# Patient Record
Sex: Female | Born: 1945 | Race: Black or African American | Hispanic: No | Smoking: Never smoker
Health system: Southern US, Community
[De-identification: ages and names within clinical notes are randomized; demographics above are authoritative.]

## PROBLEM LIST (undated history)

## (undated) DIAGNOSIS — K219 Gastro-esophageal reflux disease without esophagitis: Secondary | ICD-10-CM

## (undated) DIAGNOSIS — F424 Excoriation (skin-picking) disorder: Secondary | ICD-10-CM

## (undated) DIAGNOSIS — M1990 Unspecified osteoarthritis, unspecified site: Secondary | ICD-10-CM

## (undated) DIAGNOSIS — G8929 Other chronic pain: Secondary | ICD-10-CM

## (undated) DIAGNOSIS — R9389 Abnormal findings on diagnostic imaging of other specified body structures: Secondary | ICD-10-CM

## (undated) DIAGNOSIS — M170 Bilateral primary osteoarthritis of knee: Secondary | ICD-10-CM

## (undated) DIAGNOSIS — N951 Menopausal and female climacteric states: Secondary | ICD-10-CM

## (undated) DIAGNOSIS — I10 Essential (primary) hypertension: Secondary | ICD-10-CM

## (undated) DIAGNOSIS — R011 Cardiac murmur, unspecified: Secondary | ICD-10-CM

## (undated) DIAGNOSIS — R911 Solitary pulmonary nodule: Secondary | ICD-10-CM

## (undated) DIAGNOSIS — I5189 Other ill-defined heart diseases: Secondary | ICD-10-CM

## (undated) DIAGNOSIS — Z79890 Hormone replacement therapy (postmenopausal): Secondary | ICD-10-CM

## (undated) HISTORY — PX: ABDOMINAL HYSTERECTOMY: SHX81

## (undated) HISTORY — DX: Gastro-esophageal reflux disease without esophagitis: K21.9

## (undated) HISTORY — DX: Essential (primary) hypertension: I10

## (undated) HISTORY — DX: Unspecified osteoarthritis, unspecified site: M19.90

## (undated) HISTORY — DX: Other ill-defined heart diseases: I51.89

## (undated) HISTORY — DX: Solitary pulmonary nodule: R91.1

## (undated) HISTORY — DX: Cardiac murmur, unspecified: R01.1

---
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
## 2014-12-20 DIAGNOSIS — H9209 Otalgia, unspecified ear: Secondary | ICD-10-CM | POA: Diagnosis not present

## 2014-12-20 DIAGNOSIS — M2662 Arthralgia of temporomandibular joint: Secondary | ICD-10-CM | POA: Diagnosis not present

## 2015-01-31 ENCOUNTER — Ambulatory Visit: Payer: Self-pay | Admitting: Family Medicine

## 2015-01-31 DIAGNOSIS — Z1231 Encounter for screening mammogram for malignant neoplasm of breast: Secondary | ICD-10-CM | POA: Diagnosis not present

## 2015-03-14 DIAGNOSIS — Z1211 Encounter for screening for malignant neoplasm of colon: Secondary | ICD-10-CM | POA: Diagnosis not present

## 2015-03-14 DIAGNOSIS — Z1212 Encounter for screening for malignant neoplasm of rectum: Secondary | ICD-10-CM | POA: Diagnosis not present

## 2015-03-21 LAB — HM COLONOSCOPY: HM Colonoscopy: NEGATIVE

## 2015-04-30 DIAGNOSIS — G8929 Other chronic pain: Secondary | ICD-10-CM | POA: Diagnosis not present

## 2015-04-30 DIAGNOSIS — R1013 Epigastric pain: Secondary | ICD-10-CM | POA: Diagnosis not present

## 2015-04-30 DIAGNOSIS — R6884 Jaw pain: Secondary | ICD-10-CM | POA: Diagnosis not present

## 2015-04-30 DIAGNOSIS — H9202 Otalgia, left ear: Secondary | ICD-10-CM | POA: Diagnosis not present

## 2015-05-24 ENCOUNTER — Ambulatory Visit (INDEPENDENT_AMBULATORY_CARE_PROVIDER_SITE_OTHER): Payer: Commercial Managed Care - HMO | Admitting: Family Medicine

## 2015-05-24 ENCOUNTER — Encounter: Payer: Self-pay | Admitting: Family Medicine

## 2015-05-24 VITALS — BP 118/78 | HR 79 | Temp 97.7°F | Resp 16 | Ht 69.0 in | Wt 204.6 lb

## 2015-05-24 DIAGNOSIS — Z9181 History of falling: Secondary | ICD-10-CM | POA: Insufficient documentation

## 2015-05-24 DIAGNOSIS — Z1389 Encounter for screening for other disorder: Secondary | ICD-10-CM | POA: Insufficient documentation

## 2015-05-24 DIAGNOSIS — Z0000 Encounter for general adult medical examination without abnormal findings: Secondary | ICD-10-CM

## 2015-05-24 DIAGNOSIS — G8929 Other chronic pain: Secondary | ICD-10-CM

## 2015-05-24 DIAGNOSIS — R1013 Epigastric pain: Secondary | ICD-10-CM | POA: Diagnosis not present

## 2015-05-24 DIAGNOSIS — R6884 Jaw pain: Secondary | ICD-10-CM | POA: Insufficient documentation

## 2015-05-24 DIAGNOSIS — H9209 Otalgia, unspecified ear: Secondary | ICD-10-CM | POA: Insufficient documentation

## 2015-05-24 DIAGNOSIS — K5909 Other constipation: Secondary | ICD-10-CM

## 2015-05-24 DIAGNOSIS — IMO0002 Reserved for concepts with insufficient information to code with codable children: Secondary | ICD-10-CM | POA: Insufficient documentation

## 2015-05-24 DIAGNOSIS — F424 Excoriation (skin-picking) disorder: Secondary | ICD-10-CM

## 2015-05-24 DIAGNOSIS — Z13220 Encounter for screening for lipoid disorders: Secondary | ICD-10-CM | POA: Insufficient documentation

## 2015-05-24 DIAGNOSIS — R011 Cardiac murmur, unspecified: Secondary | ICD-10-CM | POA: Insufficient documentation

## 2015-05-24 DIAGNOSIS — Z23 Encounter for immunization: Secondary | ICD-10-CM | POA: Diagnosis not present

## 2015-05-24 DIAGNOSIS — Z131 Encounter for screening for diabetes mellitus: Secondary | ICD-10-CM | POA: Insufficient documentation

## 2015-05-24 DIAGNOSIS — K219 Gastro-esophageal reflux disease without esophagitis: Secondary | ICD-10-CM | POA: Insufficient documentation

## 2015-05-24 DIAGNOSIS — S8391XA Sprain of unspecified site of right knee, initial encounter: Secondary | ICD-10-CM

## 2015-05-24 DIAGNOSIS — M159 Polyosteoarthritis, unspecified: Secondary | ICD-10-CM | POA: Insufficient documentation

## 2015-05-24 DIAGNOSIS — I10 Essential (primary) hypertension: Secondary | ICD-10-CM | POA: Insufficient documentation

## 2015-05-24 DIAGNOSIS — M549 Dorsalgia, unspecified: Secondary | ICD-10-CM | POA: Insufficient documentation

## 2015-05-24 DIAGNOSIS — N951 Menopausal and female climacteric states: Secondary | ICD-10-CM | POA: Insufficient documentation

## 2015-05-24 DIAGNOSIS — R300 Dysuria: Secondary | ICD-10-CM | POA: Insufficient documentation

## 2015-05-24 DIAGNOSIS — M150 Primary generalized (osteo)arthritis: Secondary | ICD-10-CM

## 2015-05-24 DIAGNOSIS — R319 Hematuria, unspecified: Secondary | ICD-10-CM | POA: Insufficient documentation

## 2015-05-24 DIAGNOSIS — K580 Irritable bowel syndrome with diarrhea: Secondary | ICD-10-CM | POA: Insufficient documentation

## 2015-05-24 DIAGNOSIS — B49 Unspecified mycosis: Secondary | ICD-10-CM | POA: Insufficient documentation

## 2015-05-24 DIAGNOSIS — Z87898 Personal history of other specified conditions: Secondary | ICD-10-CM | POA: Insufficient documentation

## 2015-05-24 HISTORY — DX: Other chronic pain: G89.29

## 2015-05-24 HISTORY — DX: Menopausal and female climacteric states: N95.1

## 2015-05-24 HISTORY — DX: Excoriation (skin-picking) disorder: F42.4

## 2015-05-24 MED ORDER — DICLOFENAC SODIUM 75 MG PO TBEC: 75.0000 mg | DELAYED_RELEASE_TABLET | Freq: Two times a day (BID) | ORAL | Status: DC

## 2015-05-24 MED ORDER — LEVOFLOXACIN 500 MG PO TABS: 500.0000 mg | ORAL_TABLET | Freq: Every day | ORAL | Status: DC

## 2015-05-24 NOTE — Patient Instructions (Signed)

## 2015-05-24 NOTE — Progress Notes (Signed)
Name: Ruth Bishop   MRN: [id number]    DOB: 1946-04-12   Date:05/24/2015       Progress Note  Subjective  Chief Complaint  Chief Complaint  Patient presents with  . Annual Exam  . Spasms    patient stated that she excerbated a previous injury  . Medication Refill    Diclofenac Sodium 75mg     HPI  Patient is here today for a Complete Female Physical Exam:  The patient has complains of acute knee pain. Overall feels healthy. Diet is well balanced. In general does exercise regularly. Sees dentist regularly and addresses vision concerns with ophthalmologist if applicable. In regards to sexual activity the patient is not currently currently sexually active. Currently is not concerned about exposure to any STDs.   Menstrual history is Menopaused.    Joint/Muscle Pain: Patient complains of arthralgias for which has been present for several years. Pain is located in multiple joints, is described as boring, sharp and throbbing, and is constant .  Associated symptoms include: decreased range of motion.  The patient has tried heat, NSAIDs, position change and rest for pain, with moderate relief.  Related to injury:   Yes. Right knee injury when walking down stairs twisting motion agrevated baseline issues. Needs form filled out to excuse her from hotel feels she had planned on prior to the injury.   Past Medical History  Diagnosis Date  . Arthritis   . GERD (gastroesophageal reflux disease)   . Heart murmur   . Hypertension     Past Surgical History  Procedure Laterality Date  . Abdominal hysterectomy      Family History  Problem Relation Age of Onset  . Heart disease Mother   . Diabetes Mother   . Heart disease Father   . Heart disease Sister   . Diabetes Sister   . Heart disease Brother   . Diabetes Brother     History   Social History  . Marital Status: Widowed    Spouse Name: N/A  . Number of Children: N/A  . Years of Education: N/A   Occupational History  . Not  on file.   Social History Main Topics  . Smoking status: Never Smoker   . Smokeless tobacco: Not on file  . Alcohol Use: No  . Drug Use: No  . Sexual Activity: Yes   Other Topics Concern  . Not on file   Social History Narrative  . No narrative on file     Current outpatient prescriptions:  .  Calcium Carbonate-Vitamin D (CALTRATE 600+D) 600-400 MG-UNIT per tablet, Take by mouth., Disp: , Rfl:  .  ciprofloxacin-dexamethasone (CIPRODEX) otic suspension, Place in ear(s)., Disp: , Rfl:  .  cyclobenzaprine (FLEXERIL) 5 MG tablet, Take by mouth., Disp: , Rfl:  .  diclofenac (VOLTAREN) 75 MG EC tablet, , Disp: , Rfl:  .  diclofenac sodium (VOLTAREN) 1 % GEL, Place onto the skin., Disp: , Rfl:  .  estrogen-methylTESTOSTERone 0.625-1.25 MG per tablet, Take by mouth., Disp: , Rfl:  .  Hyoscyamine Sulfate 0.375 MG TBCR, Take by mouth., Disp: , Rfl:  .  losartan-hydrochlorothiazide (HYZAAR) 100-12.5 MG per tablet, Take by mouth., Disp: , Rfl:  .  MULTIPLE VITAMIN PO, Take by mouth., Disp: , Rfl:  .  NEOMYCIN-POLYMYXIN-HYDROCORTISONE (CORTISPORIN) 1 % SOLN otic solution, Place in ear(s)., Disp: , Rfl:  .  omeprazole (PRILOSEC) 20 MG capsule, , Disp: , Rfl:  .  omeprazole (PRILOSEC) 40 MG capsule, Take by mouth., Disp: ,  Rfl:  .  rifaximin (XIFAXAN) 200 MG tablet, Take by mouth., Disp: , Rfl:  .  senna-docusate (STOOL SOFTENER PLUS LAXATIVE) 8.6-50 MG per tablet, Take by mouth., Disp: , Rfl:  .  traMADol (ULTRAM) 50 MG tablet, Take by mouth., Disp: , Rfl:   Allergies  Allergen Reactions  . Nitrofurantoin Monohyd Macro     ROS  CONSTITUTIONAL: No significant weight changes, fever, chills, weakness or fatigue.  HEENT:  - Eyes: No visual changes.  - Ears: No auditory changes. No pain.  - Nose: No sneezing, congestion, runny nose. - Throat: No sore throat. No changes in swallowing. SKIN: No rash or itching.  CARDIOVASCULAR: No chest pain, chest pressure or chest discomfort. No  palpitations or edema.  RESPIRATORY: No shortness of breath, cough or sputum.  GASTROINTESTINAL: No anorexia, nausea, vomiting. No changes in bowel habits. No abdominal pain or blood.  GENITOURINARY: No dysuria. No frequency. No discharge.  NEUROLOGICAL: No headache, dizziness, syncope, paralysis, ataxia, numbness or tingling in the extremities. No memory changes. No change in bowel or bladder control.  MUSCULOSKELETAL: Acute injury to chronic knee and back issues. Right knee pain. Left lower back pain. No muscle pain. HEMATOLOGIC: No anemia, bleeding or bruising.  LYMPHATICS: No enlarged lymph nodes.  PSYCHIATRIC: No change in mood. No change in sleep pattern.  ENDOCRINOLOGIC: No reports of sweating, cold or heat intolerance. No polyuria or polydipsia.   Objective  Filed Vitals:   05/24/15 1119  BP: 118/78  Pulse: 79  Temp: 97.7 F (36.5 C)  TempSrc: Oral  Resp: 16  Height: 5\' 9"  (1.753 m)  Weight: 204 lb 9.6 oz (92.806 kg)  SpO2: 96%    No results found for this or any previous visit (from the past 2160 hour(s)).  Physical Exam  Musculoskeletal:       Right knee: She exhibits bony tenderness and MCL laxity. She exhibits no swelling, no effusion, no ecchymosis, no deformity, no laceration, no erythema, normal alignment and normal patellar mobility. Tenderness found.       Legs:   Constitutional: Patient appears well-developed and well-nourished. In no distress.  HEENT:  - Head: Normocephalic and atraumatic.  - Ears: Bilateral TMs gray, no erythema or effusion - Nose: Nasal mucosa moist - Mouth/Throat: Oropharynx is clear and moist. No tonsillar hypertrophy or erythema. No post nasal drainage.  - Eyes: Conjunctivae clear, EOM movements normal. PERRLA. No scleral icterus.  Neck: Normal range of motion. Neck supple. No JVD present. No thyromegaly present.  Cardiovascular: Normal rate, regular rhythm and normal heart sounds.  Stable 2/6 SEM. Pulmonary/Chest: Effort normal and  breath sounds normal. No respiratory distress. Abdominal: Soft. Bowel sounds are normal, no distension. There is no tenderness. no masses BREAST: Bilateral breast exam normal with no masses, skin changes or nipple discharge FEMALE GENITALIA: deferred RECTAL: deferred Musculoskeletal: Normal range of motion bilateral UE.  Physical Exam  Musculoskeletal:       Right knee: She exhibits bony tenderness and MCL laxity. She exhibits no swelling, no effusion, no ecchymosis, no deformity, no laceration, no erythema, normal alignment and normal patellar mobility. Tenderness found.       Legs:  Peripheral vascular: Bilateral LE no edema. Neurological: CN II-XII grossly intact with no focal deficits. Alert and oriented to person, place, and time. Coordination, balance, strength, speech and gait are normal.  Skin: Skin is warm and dry. 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 Psychiatric: Patient has a normal mood and affect. Behavior is normal  in office today. Judgment and thought content normal in office today.   Assessment & Plan  1. Annual physical exam   2. Knee sprain and strain, right, initial encounter We discussed potential pathology and long term risk of reoccurrence. Maintaining an ideal body habitus, regular exercise, proper lifting techniques and mindfulness of exacerbating factors will be useful in long term management.  Instructed on use of heating pad with exercises. Consider concomitant therapy with PT, massage therapist or chiropractor. May use anti-inflammatory medication and muscle relaxer as needed.  - diclofenac (VOLTAREN) 75 MG EC tablet; Take 1 tablet (75 mg total) by mouth 2 (two) times daily.  Dispense: 60 tablet; Refill: 5  3. Primary osteoarthritis involving multiple joints Return to [facility] if needed.  4. Abdominal pain, epigastric Previously levaquin has helped.   - levofloxacin (LEVAQUIN) 500 MG tablet; Take 1 tablet (500  mg total) by mouth daily.  Dispense: 10 tablet; Refill: 0  5. Need for pneumococcal vaccination  - Pneumococcal polysaccharide vaccine 23-valent greater than or equal to 2yo subcutaneous/IM  6. Need for diphtheria-tetanus-pertussis (Tdap) vaccine, adult/adolescent  - Tdap vaccine greater than or equal to 7yo IM

## 2015-06-05 ENCOUNTER — Telehealth: Payer: Self-pay | Admitting: Family Medicine

## 2015-06-05 ENCOUNTER — Other Ambulatory Visit: Payer: Self-pay | Admitting: Family Medicine

## 2015-06-05 DIAGNOSIS — I10 Essential (primary) hypertension: Secondary | ICD-10-CM

## 2015-06-05 DIAGNOSIS — Z13220 Encounter for screening for lipoid disorders: Secondary | ICD-10-CM

## 2015-06-05 NOTE — Telephone Encounter (Signed)
Pt recently came in for CPE and didn't get her labs done. She was wondering if you could print the slip so that she can get her labs done. Pt was like to be notified when slip is ready.

## 2015-06-05 NOTE — Telephone Encounter (Signed)
Lab work ordered and ready to be picked up and done.

## 2015-06-05 NOTE — Telephone Encounter (Signed)
Patient was informed via phone.

## 2015-06-07 ENCOUNTER — Other Ambulatory Visit: Payer: Self-pay | Admitting: Family Medicine

## 2015-06-07 DIAGNOSIS — I10 Essential (primary) hypertension: Secondary | ICD-10-CM | POA: Diagnosis not present

## 2015-06-08 LAB — COMPREHENSIVE METABOLIC PANEL
ALBUMIN: 3.9 g/dL (ref 3.6–4.8)
ALK PHOS: 136 IU/L — AB (ref 39–117)
ALT: 27 IU/L (ref 0–32)
AST: 19 IU/L (ref 0–40)
Albumin/Globulin Ratio: 1.4 (ref 1.1–2.5)
BILIRUBIN TOTAL: 0.6 mg/dL (ref 0.0–1.2)
BUN / CREAT RATIO: 17 (ref 11–26)
BUN: 14 mg/dL (ref 8–27)
CALCIUM: 9.1 mg/dL (ref 8.7–10.3)
CO2: 25 mmol/L (ref 18–29)
Chloride: 102 mmol/L (ref 97–108)
Creatinine, Ser: 0.84 mg/dL (ref 0.57–1.00)
GFR calc non Af Amer: 73 mL/min/{1.73_m2} (ref >59)
GFR, EST AFRICAN AMERICAN: 84 mL/min/{1.73_m2} (ref >59)
Globulin, Total: 2.7 g/dL (ref 1.5–4.5)
Glucose: 101 mg/dL — ABNORMAL HIGH (ref 65–99)
Potassium: 4.3 mmol/L (ref 3.5–5.2)
Sodium: 142 mmol/L (ref 134–144)
TOTAL PROTEIN: 6.6 g/dL (ref 6.0–8.5)

## 2015-06-08 LAB — CBC WITH DIFFERENTIAL/PLATELET
BASOS ABS: 0 10*3/uL (ref 0.0–0.2)
BASOS: 0 %
EOS (ABSOLUTE): 0.3 10*3/uL (ref 0.0–0.4)
EOS: 4 %
Hematocrit: 41.4 % (ref 34.0–46.6)
Hemoglobin: 13.6 g/dL (ref 11.1–15.9)
IMMATURE GRANULOCYTES: 0 %
Immature Grans (Abs): 0 10*3/uL (ref 0.0–0.1)
LYMPHS ABS: 3.1 10*3/uL (ref 0.7–3.1)
Lymphs: 45 %
MCH: 26.8 pg (ref 26.6–33.0)
MCHC: 32.9 g/dL (ref 31.5–35.7)
MCV: 82 fL (ref 79–97)
Monocytes Absolute: 0.3 10*3/uL (ref 0.1–0.9)
Monocytes: 4 %
Neutrophils Absolute: 3.3 10*3/uL (ref 1.4–7.0)
Neutrophils: 47 %
Platelets: 267 10*3/uL (ref 150–379)
RBC: 5.07 x10E6/uL (ref 3.77–5.28)
RDW: 15.5 % — ABNORMAL HIGH (ref 12.3–15.4)
WBC: 7 10*3/uL (ref 3.4–10.8)

## 2015-06-08 LAB — LIPID PANEL
Chol/HDL Ratio: 2.9 ratio units (ref 0.0–4.4)
Cholesterol, Total: 148 mg/dL (ref 100–199)
HDL: 51 mg/dL (ref >39)
LDL Calculated: 75 mg/dL (ref 0–99)
Triglycerides: 110 mg/dL (ref 0–149)
VLDL Cholesterol Cal: 22 mg/dL (ref 5–40)

## 2015-06-08 LAB — TSH: TSH: 0.906 u[IU]/mL (ref 0.450–4.500)

## 2015-06-11 ENCOUNTER — Telehealth: Payer: Self-pay | Admitting: Family Medicine

## 2015-06-11 DIAGNOSIS — M170 Bilateral primary osteoarthritis of knee: Secondary | ICD-10-CM

## 2015-06-11 DIAGNOSIS — R748 Abnormal levels of other serum enzymes: Secondary | ICD-10-CM

## 2015-06-11 NOTE — Telephone Encounter (Signed)
Patient states she needs a letter stating she got her CPE. She is also asking for a referral to [facility] for her knee. Patient states she is also going to be taking her grandchildren home on a train and she would like a note stating she has a disability due to her knees so she can get help while on the train.

## 2015-06-13 ENCOUNTER — Telehealth: Payer: Self-pay

## 2015-06-13 DIAGNOSIS — R748 Abnormal levels of other serum enzymes: Secondary | ICD-10-CM | POA: Insufficient documentation

## 2015-06-13 DIAGNOSIS — M170 Bilateral primary osteoarthritis of knee: Secondary | ICD-10-CM | POA: Insufficient documentation

## 2015-06-13 HISTORY — DX: Bilateral primary osteoarthritis of knee: M17.0

## 2015-06-13 NOTE — Telephone Encounter (Signed)
Patient was informed and stated she will be by later today or tomorrow to pick up lab order and letter

## 2015-06-13 NOTE — Telephone Encounter (Signed)
Message left for patient to come by and pick up lab order and letter.

## 2015-06-13 NOTE — Telephone Encounter (Signed)
Ruth Bishop please speak to Ruth Bishop regarding a few things:  1) Lab work looks good but one of her liver enzymes is slightly elevated and I would like to do further blood work to look into this further. I have already ordered the blood work through this telephone encounter, please print out lab orders if not already found on the printer and let patient know to pick up and have lab work done, doesn't have to be fasting.  2) I printed out a letter she requested stating that she had her CPE done, you may have to reprint it again if it is not on the printer and instead of my signature just place our clinic stamp Bonnita Nasuti has it).  3) I am not comfortable stating she has any disabilities with her knees to get assistance while traveling.  She can ask her ortho doctor for this who knows her knee issues best. I have also placed the referral to [facility] so she can continue seeing them.

## 2015-06-14 LAB — GAMMA GT: GGT: 67 IU/L — AB (ref 0–60)

## 2015-06-14 LAB — HIV ANTIBODY (ROUTINE TESTING W REFLEX): HIV Screen 4th Generation wRfx: NONREACTIVE

## 2015-06-14 LAB — HEPATITIS A ANTIBODY, TOTAL: HEP A TOTAL AB: NEGATIVE

## 2015-06-14 LAB — HEPATITIS B SURFACE ANTIBODY,QUALITATIVE: HEP B SURFACE AB, QUAL: NONREACTIVE

## 2015-06-14 LAB — HEPATITIS C ANTIBODY: HEP C VIRUS AB: 0.1 {s_co_ratio} (ref 0.0–0.9)

## 2015-06-17 ENCOUNTER — Telehealth: Payer: Self-pay

## 2015-06-17 NOTE — Telephone Encounter (Signed)
Contacted this patient to review the results from her recent blood work. After she verified her date of birth, normal labs were reviewed. Patient was thrilled and said thanks for the call.

## 2015-06-19 ENCOUNTER — Telehealth: Payer: Self-pay | Admitting: Family Medicine

## 2015-06-19 ENCOUNTER — Encounter: Payer: Self-pay | Admitting: Family Medicine

## 2015-06-19 NOTE — Telephone Encounter (Signed)
Requesting return call. Would like to know if you can order the upper GI test.

## 2015-06-20 ENCOUNTER — Other Ambulatory Visit: Payer: Self-pay | Admitting: Family Medicine

## 2015-06-20 DIAGNOSIS — R1013 Epigastric pain: Secondary | ICD-10-CM

## 2015-06-20 DIAGNOSIS — K219 Gastro-esophageal reflux disease without esophagitis: Secondary | ICD-10-CM

## 2015-06-20 NOTE — Telephone Encounter (Signed)
Patient stated a test for her stomach because it is still not feeling right. She stated that she still cannot keep anything down and wants to have it checked out.

## 2015-06-20 NOTE — Telephone Encounter (Signed)
Please ask Mrs Bostick to pick up lab slip I have ordered a breath test to be done at [facility] to see if she has a H. Pylori stomach infection.

## 2015-06-20 NOTE — Telephone Encounter (Signed)
Please ask patient to clarify what she means by "upper GI test", I may need to refer to a Gastroenterology specialist depending on her symptoms and the type of testing she is requesting.

## 2015-06-20 NOTE — Telephone Encounter (Signed)
Tried to contact patient to inform her about the new lab order, but there was no answer.

## 2015-06-21 ENCOUNTER — Other Ambulatory Visit: Payer: Self-pay | Admitting: Family Medicine

## 2015-06-21 DIAGNOSIS — K219 Gastro-esophageal reflux disease without esophagitis: Secondary | ICD-10-CM | POA: Diagnosis not present

## 2015-06-21 DIAGNOSIS — R1013 Epigastric pain: Secondary | ICD-10-CM | POA: Diagnosis not present

## 2015-06-21 DIAGNOSIS — M170 Bilateral primary osteoarthritis of knee: Secondary | ICD-10-CM | POA: Diagnosis not present

## 2015-06-21 NOTE — Telephone Encounter (Signed)
Tried to contact this patient again to inform her about the additional testing that has been ordered, but there was no answer. A message was left for the patient instructing her to come by and pick up her lab order and if she had any questions to give [place] a call.

## 2015-06-24 ENCOUNTER — Other Ambulatory Visit: Payer: Self-pay | Admitting: Family Medicine

## 2015-06-24 DIAGNOSIS — K219 Gastro-esophageal reflux disease without esophagitis: Secondary | ICD-10-CM

## 2015-06-24 DIAGNOSIS — K5909 Other constipation: Secondary | ICD-10-CM

## 2015-06-24 DIAGNOSIS — R1013 Epigastric pain: Secondary | ICD-10-CM

## 2015-06-24 DIAGNOSIS — K580 Irritable bowel syndrome with diarrhea: Secondary | ICD-10-CM

## 2015-06-24 LAB — H. PYLORI BREATH TEST: H. PYLORI UBIT: NEGATIVE

## 2015-06-24 NOTE — Progress Notes (Signed)
Referral to [facility] placed per patient request.

## 2015-07-01 ENCOUNTER — Telehealth: Payer: Self-pay | Admitting: Family Medicine

## 2015-07-01 NOTE — Telephone Encounter (Signed)
Pt states she is still waiting on her referral to the GI dr. She was told that if she did not hear anything last week to call today.

## 2015-07-01 NOTE — Telephone Encounter (Signed)
Called patient, had to leave a message. I faxed her referral to [facility], I'm waiting for them to fax back with an appt, date and time.

## 2015-07-02 NOTE — Telephone Encounter (Signed)
Spoke with patient and notified her that once we here from [facility] with an appt date and time I'll notify her

## 2015-07-19 ENCOUNTER — Encounter: Payer: Self-pay | Admitting: Family Medicine

## 2015-08-09 ENCOUNTER — Telehealth: Payer: Self-pay | Admitting: Family Medicine

## 2015-08-09 NOTE — Telephone Encounter (Signed)
ERRENOUS °

## 2015-08-26 ENCOUNTER — Telehealth: Payer: Self-pay | Admitting: Family Medicine

## 2015-08-26 ENCOUNTER — Other Ambulatory Visit: Payer: Self-pay

## 2015-08-26 NOTE — Telephone Encounter (Addendum)
Got a fax from [facility] requesting a refill of estrogenmtest & losartan/hct (100-12.5)  Refill request was sent to Dr. Bobetta Lime for approval and submission.

## 2015-08-26 NOTE — Telephone Encounter (Signed)
REFILL NEEDED 1. LOSARTAIN /HCT AND 2. EST ESTROGEN MTEST . PT IS OUT. [place]

## 2015-08-26 NOTE — Telephone Encounter (Signed)
Refill request was sent to Dr. Ashany Sundaram for approval and submission.  

## 2015-08-27 ENCOUNTER — Telehealth: Payer: Self-pay | Admitting: Family Medicine

## 2015-08-27 DIAGNOSIS — R1084 Generalized abdominal pain: Secondary | ICD-10-CM | POA: Diagnosis not present

## 2015-08-27 DIAGNOSIS — K219 Gastro-esophageal reflux disease without esophagitis: Secondary | ICD-10-CM | POA: Diagnosis not present

## 2015-08-27 DIAGNOSIS — R634 Abnormal weight loss: Secondary | ICD-10-CM | POA: Diagnosis not present

## 2015-08-27 MED ORDER — EST ESTROGENS-METHYLTEST 0.625-1.25 MG PO TABS: 1.0000 | ORAL_TABLET | Freq: Every day | ORAL | Status: DC

## 2015-08-27 MED ORDER — LOSARTAN POTASSIUM-HCTZ 100-12.5 MG PO TABS: 1.0000 | ORAL_TABLET | Freq: Every day | ORAL | Status: DC

## 2015-08-27 NOTE — Telephone Encounter (Signed)
Pt called her pharmacy and was told that her losartan was denied. Would like a return call to explain why. States she really need the prescription. Last seen her in June 2016. (h) [phone] (c) [phone]

## 2015-08-27 NOTE — Telephone Encounter (Signed)
Tried to contac tpatient to let her know the estrogen-methyltestosterone rx is ready for pick up and that the rx for Losartan was sent in, but there was no answer and i was not able to leave a message.

## 2015-09-03 DIAGNOSIS — I10 Essential (primary) hypertension: Secondary | ICD-10-CM | POA: Diagnosis not present

## 2015-09-03 DIAGNOSIS — H5210 Myopia, unspecified eye: Secondary | ICD-10-CM | POA: Diagnosis not present

## 2015-09-30 ENCOUNTER — Encounter: Payer: Self-pay | Admitting: *Deleted

## 2015-10-01 ENCOUNTER — Ambulatory Visit
Admission: RE | Admit: 2015-10-01 | Discharge: 2015-10-01 | Disposition: A | Discharge status: Home / Self Care | Payer: Commercial Managed Care - HMO | Source: Ambulatory Visit | Attending: Gastroenterology | Admitting: Gastroenterology

## 2015-10-01 ENCOUNTER — Ambulatory Visit: Payer: Commercial Managed Care - HMO | Admitting: Anesthesiology

## 2015-10-01 ENCOUNTER — Encounter
Admission: RE | Disposition: A | Discharge status: Home / Self Care | Payer: Self-pay | Source: Ambulatory Visit | Attending: Gastroenterology

## 2015-10-01 ENCOUNTER — Encounter: Payer: Self-pay | Admitting: Anesthesiology

## 2015-10-01 DIAGNOSIS — R1084 Generalized abdominal pain: Secondary | ICD-10-CM | POA: Diagnosis present

## 2015-10-01 DIAGNOSIS — K635 Polyp of colon: Secondary | ICD-10-CM | POA: Diagnosis not present

## 2015-10-01 DIAGNOSIS — K2950 Unspecified chronic gastritis without bleeding: Secondary | ICD-10-CM | POA: Diagnosis not present

## 2015-10-01 DIAGNOSIS — I10 Essential (primary) hypertension: Secondary | ICD-10-CM | POA: Diagnosis not present

## 2015-10-01 DIAGNOSIS — K5730 Diverticulosis of large intestine without perforation or abscess without bleeding: Secondary | ICD-10-CM | POA: Insufficient documentation

## 2015-10-01 DIAGNOSIS — Z888 Allergy status to other drugs, medicaments and biological substances status: Secondary | ICD-10-CM | POA: Diagnosis not present

## 2015-10-01 DIAGNOSIS — K5900 Constipation, unspecified: Secondary | ICD-10-CM | POA: Insufficient documentation

## 2015-10-01 DIAGNOSIS — K621 Rectal polyp: Secondary | ICD-10-CM | POA: Diagnosis not present

## 2015-10-01 DIAGNOSIS — K317 Polyp of stomach and duodenum: Secondary | ICD-10-CM | POA: Insufficient documentation

## 2015-10-01 DIAGNOSIS — M1990 Unspecified osteoarthritis, unspecified site: Secondary | ICD-10-CM | POA: Insufficient documentation

## 2015-10-01 DIAGNOSIS — K210 Gastro-esophageal reflux disease with esophagitis: Secondary | ICD-10-CM | POA: Insufficient documentation

## 2015-10-01 DIAGNOSIS — K209 Esophagitis, unspecified: Secondary | ICD-10-CM | POA: Diagnosis not present

## 2015-10-01 DIAGNOSIS — D127 Benign neoplasm of rectosigmoid junction: Secondary | ICD-10-CM | POA: Diagnosis not present

## 2015-10-01 DIAGNOSIS — K2970 Gastritis, unspecified, without bleeding: Secondary | ICD-10-CM | POA: Diagnosis not present

## 2015-10-01 DIAGNOSIS — R011 Cardiac murmur, unspecified: Secondary | ICD-10-CM | POA: Insufficient documentation

## 2015-10-01 DIAGNOSIS — K2960 Other gastritis without bleeding: Secondary | ICD-10-CM | POA: Diagnosis not present

## 2015-10-01 DIAGNOSIS — K3189 Other diseases of stomach and duodenum: Secondary | ICD-10-CM | POA: Diagnosis not present

## 2015-10-01 HISTORY — PX: COLONOSCOPY WITH PROPOFOL: SHX5780

## 2015-10-01 HISTORY — PX: ESOPHAGOGASTRODUODENOSCOPY (EGD) WITH PROPOFOL: SHX5813

## 2015-10-01 SURGERY — COLONOSCOPY WITH PROPOFOL
Anesthesia: General

## 2015-10-01 MED ORDER — SODIUM CHLORIDE 0.9 % IV SOLN
INTRAVENOUS | Status: DC
  Administered 2015-10-01 (×2): via INTRAVENOUS

## 2015-10-01 MED ORDER — PROPOFOL 500 MG/50ML IV EMUL
INTRAVENOUS | Status: DC | PRN
  Administered 2015-10-01: 140 ug/kg/min via INTRAVENOUS

## 2015-10-01 MED ORDER — PROPOFOL 10 MG/ML IV BOLUS
INTRAVENOUS | Status: DC | PRN
  Administered 2015-10-01 (×2): 20 mg via INTRAVENOUS

## 2015-10-01 MED ORDER — FENTANYL CITRATE (PF) 100 MCG/2ML IJ SOLN
INTRAMUSCULAR | Status: DC | PRN
Start: 2015-10-01 — End: 2015-10-01
  Administered 2015-10-01: 50 ug via INTRAVENOUS

## 2015-10-01 MED ORDER — SODIUM CHLORIDE 0.9 % IV SOLN: INTRAVENOUS | Status: DC

## 2015-10-01 MED ORDER — LIDOCAINE HCL (CARDIAC) 20 MG/ML IV SOLN
INTRAVENOUS | Status: DC | PRN
  Administered 2015-10-01: 30 mg via INTRAVENOUS

## 2015-10-01 MED ORDER — MIDAZOLAM HCL 5 MG/5ML IJ SOLN
INTRAMUSCULAR | Status: DC | PRN
  Administered 2015-10-01: 1 mg via INTRAVENOUS

## 2015-10-01 NOTE — Anesthesia Preprocedure Evaluation (Signed)
Anesthesia Evaluation  Patient identified by MRN, date of birth, ID band Patient awake    Reviewed: Allergy & Precautions, H&P , NPO status , Patient's Chart, lab work & pertinent test results  History of Anesthesia Complications Negative for: history of anesthetic complications  Airway Mallampati: II  TM Distance: >3 FB Neck ROM: full    Dental no notable dental hx. (+) Teeth Intact   Pulmonary neg pulmonary ROS,    Pulmonary exam normal breath sounds clear to auscultation       Cardiovascular Exercise Tolerance: Good hypertension, Normal cardiovascular exam+ Valvular Problems/Murmurs  Rhythm:regular Rate:Normal     Neuro/Psych negative neurological ROS  negative psych ROS   GI/Hepatic Neg liver ROS, GERD  Controlled,  Endo/Other  negative endocrine ROS  Renal/GU negative Renal ROS  negative genitourinary   Musculoskeletal negative musculoskeletal ROS (+)   Abdominal   Peds negative pediatric ROS (+)  Hematology negative hematology ROS (+)   Anesthesia Other Findings Past Medical History:   Arthritis                                                    GERD (gastroesophageal reflux disease)                       Heart murmur                                                 Hypertension                                                Past Surgical History:   ABDOMINAL HYSTERECTOMY                                       BMI    Body Mass Index   29.07 kg/m 2      Reproductive/Obstetrics negative OB ROS                             Anesthesia Physical Anesthesia Plan  ASA: III  Anesthesia Plan: General   Post-op Pain Management:    Induction:   Airway Management Planned:   Additional Equipment:   Intra-op Plan:   Post-operative Plan:   Informed Consent: I have reviewed the patients History and Physical, chart, labs and discussed the procedure including the risks, benefits  and alternatives for the proposed anesthesia with the patient or authorized representative who has indicated his/her understanding and acceptance.   Dental Advisory Given  Plan Discussed with: Anesthesiologist, CRNA and Surgeon  Anesthesia Plan Comments:         Anesthesia Quick Evaluation

## 2015-10-01 NOTE — Anesthesia Postprocedure Evaluation (Signed)
  Anesthesia Post-op Note  Patient: Ruth Bishop  Procedure(s) Performed: Procedure(s): COLONOSCOPY WITH PROPOFOL (N/A) ESOPHAGOGASTRODUODENOSCOPY (EGD) WITH PROPOFOL (N/A)  Anesthesia type:General  Patient location: PACU  Post pain: Pain level controlled  Post assessment: Post-op Vital signs reviewed, Patient's Cardiovascular Status Stable, Respiratory Function Stable, Patent Airway and No signs of Nausea or vomiting  Post vital signs: Reviewed and stable  Last Vitals:  Filed Vitals:   10/01/15 1602  BP: 133/63  Pulse: 56  Temp:   Resp: 13    Level of consciousness: awake, alert  and patient cooperative  Complications: No apparent anesthesia complications

## 2015-10-01 NOTE — H&P (Signed)
Outpatient short stay form Pre-procedure 10/01/2015 2:35 PM Ruth Sails MD  Primary Physician: Dr. Iva Lento  Reason for visit:  EGD and colonoscopy  History of present illness:  Patient is a 69 year old female presenting today with complaint of abdominal pain. This is generalized but mostly in the periumbilical region mostly above such. There is some intermittent constipation. She takes some stool softeners. She is previously taking some diclofenac for arthritic pain however that has been discontinued and some her discomfort has improved. She is also taking Protonix 40 mg a day now previously taking omeprazole. She tolerated her prep well. She takes no aspirin or blood thinning products.    Current facility-administered medications:  .  0.9 %  sodium chloride infusion, , Intravenous, Continuous, Ruth Sails, MD, Last Rate: 20 mL/hr at 10/01/15 1328 .  0.9 %  sodium chloride infusion, , Intravenous, Continuous, Ruth Sails, MD  Prescriptions prior to admission  Medication Sig Dispense Refill Last Dose  . Calcium Carbonate-Vitamin D (CALTRATE 600+D) 600-400 MG-UNIT per tablet Take by mouth.   Taking  . ciprofloxacin-dexamethasone (CIPRODEX) otic suspension Place in ear(s).     . cyclobenzaprine (FLEXERIL) 5 MG tablet Take by mouth.   Taking  . diclofenac (VOLTAREN) 75 MG EC tablet Take 1 tablet (75 mg total) by mouth 2 (two) times daily. 60 tablet 5   . diclofenac sodium (VOLTAREN) 1 % GEL Place onto the skin.     Marland Kitchen estrogen-methylTESTOSTERone 0.625-1.25 MG per tablet Take 1 tablet by mouth daily. 30 tablet 5   . Hyoscyamine Sulfate 0.375 MG TBCR Take by mouth.   Taking  . levofloxacin (LEVAQUIN) 500 MG tablet Take 1 tablet (500 mg total) by mouth daily. 10 tablet 0   . losartan-hydrochlorothiazide (HYZAAR) 100-12.5 MG per tablet Take 1 tablet by mouth daily. 90 tablet 3   . MULTIPLE VITAMIN PO Take by mouth.   Taking  . NEOMYCIN-POLYMYXIN-HYDROCORTISONE (CORTISPORIN) 1 %  SOLN otic solution Place in ear(s).   Taking  . omeprazole (PRILOSEC) 20 MG capsule    Taking  . omeprazole (PRILOSEC) 40 MG capsule Take by mouth.   Taking  . rifaximin (XIFAXAN) 200 MG tablet Take by mouth.   Taking  . senna-docusate (STOOL SOFTENER PLUS LAXATIVE) 8.6-50 MG per tablet Take by mouth.   Taking  . traMADol (ULTRAM) 50 MG tablet Take by mouth.   Taking     Allergies  Allergen Reactions  . Nitrofurantoin Monohyd Macro      Past Medical History  Diagnosis Date  . Arthritis   . GERD (gastroesophageal reflux disease)   . Heart murmur   . Hypertension     Review of systems:      Physical Exam    Heart and lungs: Regular rate and rhythm without rub or gallop, lungs are bilaterally clear    HEENT: Norm cephalic atraumatic eyes are anicteric    Other:     Pertinant exam for procedure: Soft mild tenderness to palpation in the lower epigastric region extending toward the umbilicus. No masses or rebound. There is a small herniation/ thinness of the abdominal wall of the umbilicus does not appear to contain anything.    Planned proceedures: EGD and colonoscopy with indicated procedures. I have discussed the risks benefits and complications of procedures to include not limited to bleeding, infection, perforation and the risk of sedation and the patient wishes to proceed.    Ruth Sails, MD Gastroenterology 10/01/2015  2:35 PM

## 2015-10-01 NOTE — Op Note (Signed)
[facility] Gastroenterology Patient Name: Ruth Bishop Procedure Date: 10/01/2015 2:37 PM MRN: [id number] Account #: [id number] Date of Birth: 1946/09/29 Admit Type: Outpatient Age: 69 Room: [facility] ENDO ROOM 3 Gender: Female Note Status: Finalized Procedure:         Colonoscopy Indications:       Generalized abdominal pain Providers:         Lollie Sails, MD Referring MD:      Bobetta Lime (Referring MD) Medicines:         Monitored Anesthesia Care Complications:     No immediate complications. Procedure:         Pre-Anesthesia Assessment:                    - ASA Grade Assessment: III - A patient with severe                     systemic disease.                    After obtaining informed consent, the colonoscope was                     passed under direct vision. Throughout the procedure, the                     patient's blood pressure, pulse, and oxygen saturations                     were monitored continuously. The Colonoscope was                     introduced through the anus and advanced to the the cecum,                     identified by appendiceal orifice and ileocecal valve. The                     colonoscopy was performed without difficulty. The patient                     tolerated the procedure well. The quality of the bowel                     preparation was good. Findings:      A 4 mm polyp was found in the recto-sigmoid colon. The polyp was       sessile. The polyp was removed with a cold snare. Resection and       retrieval were complete.      Two sessile polyps were found in the rectum. The polyps were 1 to 2 mm       in size. These polyps were removed with a cold biopsy forceps. Resection       and retrieval were complete.      A few small-mouthed diverticula were found in the sigmoid colon.      The digital rectal exam was normal. Perirectal skin is depigmented.      Biopsies for histology were taken with a cold forceps from  the right       colon and left colon for evaluation of microscopic colitis. Impression:        - One 4 mm polyp at the recto-sigmoid colon. Resected and  retrieved.                    - Two 1 to 2 mm polyps in the rectum. Resected and                     retrieved.                    - Diverticulosis in the sigmoid colon. Recommendation:    - Await pathology results. Procedure Code(s): --- Professional ---                    [phone], Colonoscopy, flexible; with removal of tumor(s),                     polyp(s), or other lesion(s) by snare technique                    45380, 30, Colonoscopy, flexible; with biopsy, single or                     multiple Diagnosis Code(s): --- Professional ---                    211.4, Benign neoplasm of rectum and anal canal                    569.0, Anal and rectal polyp                    789.07, Abdominal pain, generalized                    562.10, Diverticulosis of colon (without mention of                     hemorrhage) CPT copyright 2014 [employer] Medical [employer]. All rights reserved. The codes documented in this report are preliminary and upon coder review may  be revised to meet current compliance requirements. Lollie Sails, MD 10/01/2015 3:29:18 PM This report has been signed electronically. Number of Addenda: 0 Note Initiated On: 10/01/2015 2:37 PM Scope Withdrawal Time: 0 hours 14 minutes 19 seconds  Total Procedure Duration: 0 hours 20 minutes 10 seconds       [facility]

## 2015-10-01 NOTE — Op Note (Signed)
[facility] Patient Name: Ruth Bishop Procedure Date: 10/01/2015 2:38 PM MRN: [id number] Account #: [id number] Date of Birth: 05-May-1946 Admit Type: Outpatient Age: 69 Room: [facility] ENDO ROOM 3 Gender: Female Note Status: Finalized Procedure:         Upper GI endoscopy Indications:       Generalized abdominal pain, Dyspepsia Providers:         Lollie Sails, MD Referring MD:      Bobetta Lime (Referring MD) Medicines:         Monitored Anesthesia Care Complications:     No immediate complications. Procedure:         Pre-Anesthesia Assessment:                    - ASA Grade Assessment: III - A patient with severe                     systemic disease.                    After obtaining informed consent, the endoscope was passed                     under direct vision. Throughout the procedure, the                     patient's blood pressure, pulse, and oxygen saturations                     were monitored continuously. The Endoscope was introduced                     through the mouth, and advanced to the third part of                     duodenum. The upper GI endoscopy was accomplished without                     difficulty. The patient tolerated the procedure well. The                     patient tolerated the procedure well. Findings:      LA Grade A (one or more mucosal breaks less than 5 mm, not extending       between tops of 2 mucosal folds) esophagitis with no bleeding was found.       Biopsies were taken with a cold forceps for histology.      The exam of the esophagus was otherwise normal.      Multiple diminutive sessile polyps with no bleeding and no stigmata of       recent bleeding were found in the gastric body. Biopsies were taken with       a cold forceps for histology.      Diffuse and patchy mild inflammation characterized by congestion (edema)       and erythema was found in the gastric body. Biopsies were taken with  a       cold forceps for histology. Biopsies were taken with a cold forceps for       Helicobacter pylori testing.      Diffuse mucosal flattening was found in the entire examined duodenum.       Biopsies were taken with a cold forceps for histology.      The cardia and gastric fundus were normal  on retroflexion. Impression:        - LA Grade A erosive esophagitis. Biopsied.                    - Multiple gastric polyps. Biopsied.                    - Gastritis. Biopsied.                    - Flattened mucosa was found in the duodenum, not                     consistent with celiac disease. Biopsied. Recommendation:    - Continue present medications. Procedure Code(s): --- Professional ---                    [phone], Esophagogastroduodenoscopy, flexible, transoral;                     with biopsy, single or multiple Diagnosis Code(s): --- Professional ---                    530.19, Other esophagitis                    211.1, Benign neoplasm of stomach                    535.50, Unspecified gastritis and gastroduodenitis,                     without mention of hemorrhage                    537.9, Unspecified disorder of stomach and duodenum                    789.07, Abdominal pain, generalized                    536.8, Dyspepsia and other specified disorders of function                     of stomach CPT copyright 2014 American Medical Association. All rights reserved. The codes documented in this report are preliminary and upon coder review may  be revised to meet current compliance requirements. Lollie Sails, MD 10/01/2015 3:03:00 PM This report has been signed electronically. Number of Addenda: 0 Note Initiated On: 10/01/2015 2:38 PM      [facility]

## 2015-10-01 NOTE — Transfer of Care (Signed)
Immediate Anesthesia Transfer of Care Note  Patient: Ruth Bishop  Procedure(s) Performed: Procedure(s): COLONOSCOPY WITH PROPOFOL (N/A) ESOPHAGOGASTRODUODENOSCOPY (EGD) WITH PROPOFOL (N/A)  Patient Location: Endoscopy Unit  Anesthesia Type:General  Level of Consciousness: awake  Airway & Oxygen Therapy: Patient Spontanous Breathing  Post-op Assessment: Post -op Vital signs reviewed and stable  Post vital signs: Reviewed  Last Vitals:  Filed Vitals:   10/01/15 1531  BP: 125/62  Pulse: 62  Temp: 36.3 C  Resp: 16    Complications: No apparent anesthesia complications

## 2015-10-02 ENCOUNTER — Other Ambulatory Visit: Payer: Self-pay | Admitting: Gastroenterology

## 2015-10-02 DIAGNOSIS — R1033 Periumbilical pain: Secondary | ICD-10-CM

## 2015-10-03 ENCOUNTER — Encounter: Payer: Self-pay | Admitting: Gastroenterology

## 2015-10-04 LAB — SURGICAL PATHOLOGY

## 2015-10-09 ENCOUNTER — Ambulatory Visit: Admission: RE | Admit: 2015-10-09 | Payer: Commercial Managed Care - HMO | Source: Ambulatory Visit

## 2015-10-14 DIAGNOSIS — Z0100 Encounter for examination of eyes and vision without abnormal findings: Secondary | ICD-10-CM | POA: Diagnosis not present

## 2015-10-16 ENCOUNTER — Ambulatory Visit
Admission: RE | Admit: 2015-10-16 | Discharge: 2015-10-16 | Disposition: A | Discharge status: Home / Self Care | Payer: Commercial Managed Care - HMO | Source: Ambulatory Visit | Attending: Gastroenterology | Admitting: Gastroenterology

## 2015-10-16 DIAGNOSIS — R1033 Periumbilical pain: Secondary | ICD-10-CM | POA: Diagnosis not present

## 2015-10-16 DIAGNOSIS — K429 Umbilical hernia without obstruction or gangrene: Secondary | ICD-10-CM | POA: Insufficient documentation

## 2015-10-16 MED ORDER — IOHEXOL 300 MG/ML  SOLN
100.0000 mL | Freq: Once | INTRAMUSCULAR | Status: AC | PRN
  Administered 2015-10-16: 100 mL via INTRAVENOUS

## 2016-01-03 ENCOUNTER — Ambulatory Visit: Payer: Self-pay | Admitting: Family Medicine

## 2016-01-03 ENCOUNTER — Encounter: Payer: Self-pay | Admitting: Family Medicine

## 2016-01-03 ENCOUNTER — Ambulatory Visit (INDEPENDENT_AMBULATORY_CARE_PROVIDER_SITE_OTHER): Payer: Commercial Managed Care - HMO | Admitting: Family Medicine

## 2016-01-03 VITALS — BP 126/66 | HR 68 | Temp 97.7°F | Resp 16 | Ht 69.0 in | Wt 203.2 lb

## 2016-01-03 DIAGNOSIS — M150 Primary generalized (osteo)arthritis: Secondary | ICD-10-CM

## 2016-01-03 DIAGNOSIS — J069 Acute upper respiratory infection, unspecified: Secondary | ICD-10-CM

## 2016-01-03 DIAGNOSIS — J40 Bronchitis, not specified as acute or chronic: Secondary | ICD-10-CM

## 2016-01-03 DIAGNOSIS — M159 Polyosteoarthritis, unspecified: Secondary | ICD-10-CM

## 2016-01-03 MED ORDER — FLUTICASONE FUROATE-VILANTEROL 100-25 MCG/INH IN AEPB: 1.0000 | INHALATION_SPRAY | Freq: Every day | RESPIRATORY_TRACT | Status: DC

## 2016-01-03 MED ORDER — GUAIFENESIN-CODEINE 100-10 MG/5ML PO SOLN: 10.0000 mL | Freq: Three times a day (TID) | ORAL | Status: DC | PRN

## 2016-01-03 NOTE — Progress Notes (Signed)
Name: Ruth Bishop   MRN: [id number]    DOB: Apr 09, 1946   Date:01/03/2016       Progress Note  Subjective  Chief Complaint  Chief Complaint  Patient presents with  . URI    Onset-Tuesday am, Chest congestion, cough, sneezing, wheezing, mucus in chest and unable to get it up, fatigue, and losing her voice. Symptoms are worsen patient has tried robitussin, gargle with warm salt water, and throat lozenges with no relief.    HPI  Bronchitis/URI: symptoms started a few days ago with sneezing, nasal congestion, followed by chest congestion, wet cough, chills, wheezing, fatigue some hoarseness. She also has noticed a decrease in appetite and fatigue. She is up to date with flu and PCV vaccinations. She is taking Mucinex otc without much help of symptoms. She is drinking fluids to sty hydrated  OA: she is seeing Ortho, Dr. Sabra Heck, she was taking NSAID's but it cause epigastric pain secondary to erosive esophagitis and gastritis, she is now only taking Alevel occasionally, left knee pain has been getting worse, discussed taking Tylenol and continue her physical activity to strength her quad muscles.   Patient Active Problem List   Diagnosis Date Noted  . Elevated alkaline phosphatase level 06/13/2015  . Osteoarthritis of both knees 06/13/2015  . Abdominal pain, epigastric 05/24/2015  . Encounter for general adult medical examination without abnormal findings 05/24/2015  . 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 Back ache 05/24/2015  . Chronic pain 05/24/2015  . Chronic constipation 05/24/2015  . Encounter for screening for diabetes mellitus 05/24/2015  . Difficult or painful urination 05/24/2015  . 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 Cardiac murmur 05/24/2015  . Blood in the urine 05/24/2015  . Hypertension goal BP (blood pressure) < 150/90 05/24/2015  . 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  . Jaw pain 05/24/2015  . Post menopausal  syndrome 05/24/2015  . Screening for gout 05/24/2015  . At risk for falling 05/24/2015  . Screening cholesterol level 05/24/2015  . Skin-picking disorder 05/24/2015  . Routine general medical examination at a health care facility 05/24/2015    Past Surgical History  Procedure Laterality Date  . Abdominal hysterectomy    . Colonoscopy with propofol N/A 10/01/2015    Procedure: COLONOSCOPY WITH PROPOFOL;  Surgeon: Lollie Sails, MD;  Location: [facility] ENDOSCOPY;  Service: Endoscopy;  Laterality: N/A;  . Esophagogastroduodenoscopy (egd) with propofol N/A 10/01/2015    Procedure: ESOPHAGOGASTRODUODENOSCOPY (EGD) WITH PROPOFOL;  Surgeon: Lollie Sails, MD;  Location: [facility] ENDOSCOPY;  Service: Endoscopy;  Laterality: N/A;    Family History  Problem Relation Age of Onset  . Heart disease Mother   . Diabetes Mother   . Heart disease Father   . Heart disease Sister   . Diabetes Sister   . Heart disease Brother   . Diabetes Brother     Social History   Social History  . Marital Status: Widowed    Spouse Name: N/A  . Number of Children: N/A  . Years of Education: N/A   Occupational History  . Not on file.   Social History Main Topics  . Smoking status: Never Smoker   . Smokeless tobacco: Not on file  . Alcohol Use: No  . Drug Use: No  . Sexual Activity: Yes   Other Topics Concern  . Not on file   Social History Narrative     Current outpatient prescriptions:  .  Calcium Carbonate-Vitamin D (CALTRATE 600+D) 600-400 MG-UNIT per tablet, Take  by mouth., Disp: , Rfl:  .  diclofenac sodium (VOLTAREN) 1 % GEL, Place onto the skin., Disp: , Rfl:  .  estrogen-methylTESTOSTERone 0.625-1.25 MG per tablet, Take 1 tablet by mouth daily., Disp: 30 tablet, Rfl: 5 .  Hyoscyamine Sulfate 0.375 MG TBCR, Take by mouth., Disp: , Rfl:  .  losartan-hydrochlorothiazide (HYZAAR) 100-12.5 MG per tablet, Take 1 tablet by mouth daily., Disp: 90 tablet, Rfl: 3 .  MULTIPLE VITAMIN PO, Take by  mouth., Disp: , Rfl:  .  pantoprazole (PROTONIX) 40 MG tablet, Take by mouth., Disp: , Rfl:   Allergies  Allergen Reactions  . Nitrofurantoin Monohyd Macro      ROS  Ten systems reviewed and is negative except as mentioned in HPI   Objective  Filed Vitals:   01/03/16 1346  BP: 126/66  Pulse: 68  Temp: 97.7 F (36.5 C)  TempSrc: Oral  Resp: 16  Height: 5\' 9"  (1.753 m)  Weight: 203 lb 3.2 oz (92.171 kg)  SpO2: 98%    Body mass index is 29.99 kg/(m^2).  Physical Exam  Constitutional: Patient appears well-developed and well-nourished. Obese  No distress.  HEENT: head atraumatic, normocephalic, pupils equal and reactive to light, ears normal TM,neck supple, throat within normal limits Cardiovascular: Normal rate, regular rhythm and normal heart sounds.  No murmur heard. No BLE edema. Pulmonary/Chest: Effort normal and breath sounds normal. No respiratory distress. Abdominal: Soft.  There is no tenderness. Psychiatric: Patient has a normal mood and affect. behavior is normal. Judgment and thought content normal.  PHQ2/9: Depression screen PHQ 2/9 05/24/2015  Decreased Interest 0  Down, Depressed, Hopeless 0  PHQ - 2 Score 0    Fall Risk: Fall Risk  05/24/2015  Falls in the past year? Yes  Number falls in past yr: 1  Injury with Fall? No    Assessment & Plan   1. Upper respiratory infection  Use nasal saline, continue fluids, rest  2. Primary osteoarthritis involving multiple joints  Discuss it further with Ortho, avoid NSAID's try Tylenol , explained the reason why she should not take NSAID's   3. Bronchitis  Call back if symptoms do not improve, discussed how to use Breo and to rinse mouth after using medication  - Fluticasone Furoate-Vilanterol (BREO ELLIPTA) 100-25 MCG/INH AEPB; Inhale 1 puff into the lungs daily.  Dispense: 60 each; Refill: 0 - guaiFENesin-codeine 100-10 MG/5ML syrup; Take 10 mLs by mouth 3 (three) times daily as needed for cough.   Dispense: 120 mL; Refill: 0

## 2016-03-04 ENCOUNTER — Other Ambulatory Visit: Payer: Self-pay

## 2016-03-04 MED ORDER — EST ESTROGENS-METHYLTEST 0.625-1.25 MG PO TABS
1.0000 | ORAL_TABLET | Freq: Every day | ORAL | Status: DC
Start: 2016-03-04 — End: 2016-04-21

## 2016-03-04 NOTE — Telephone Encounter (Signed)
Left voice message to inform patient that prescription has been sent to pharmacy and that patient need to schedule appointment

## 2016-04-21 ENCOUNTER — Other Ambulatory Visit: Payer: Self-pay | Admitting: Family Medicine

## 2016-04-21 DIAGNOSIS — Z1239 Encounter for other screening for malignant neoplasm of breast: Secondary | ICD-10-CM | POA: Insufficient documentation

## 2016-04-21 NOTE — Telephone Encounter (Signed)
Last BP, lipid panel, mammo report reviewed; patient has upcoming appt with me in June I'll refill medicine for one month only, mammo due ------------------------------ Roselyn Reef, Please ask patient to go for her mammogram; last was Feb 2016 so she is due I'll allow one refill to give her time to get that scheduled and done Very important to stay up-to-date with mammograms when taking hormone replacement therapy I look forward to meeting her Thank you

## 2016-04-21 NOTE — Telephone Encounter (Signed)
Patient requesting refill. 

## 2016-05-26 ENCOUNTER — Other Ambulatory Visit: Payer: Self-pay

## 2016-05-26 ENCOUNTER — Encounter: Payer: Self-pay | Admitting: Family Medicine

## 2016-05-26 ENCOUNTER — Ambulatory Visit (INDEPENDENT_AMBULATORY_CARE_PROVIDER_SITE_OTHER): Payer: Commercial Managed Care - HMO | Admitting: Family Medicine

## 2016-05-26 VITALS — BP 132/60 | HR 71 | Temp 98.1°F | Resp 14 | Wt 201.0 lb

## 2016-05-26 DIAGNOSIS — R358 Other polyuria: Secondary | ICD-10-CM

## 2016-05-26 DIAGNOSIS — R739 Hyperglycemia, unspecified: Secondary | ICD-10-CM

## 2016-05-26 DIAGNOSIS — Z780 Asymptomatic menopausal state: Secondary | ICD-10-CM

## 2016-05-26 DIAGNOSIS — Z0000 Encounter for general adult medical examination without abnormal findings: Secondary | ICD-10-CM

## 2016-05-26 DIAGNOSIS — Z79890 Hormone replacement therapy (postmenopausal): Secondary | ICD-10-CM | POA: Diagnosis not present

## 2016-05-26 DIAGNOSIS — Z5181 Encounter for therapeutic drug level monitoring: Secondary | ICD-10-CM | POA: Diagnosis not present

## 2016-05-26 DIAGNOSIS — R3589 Other polyuria: Secondary | ICD-10-CM

## 2016-05-26 DIAGNOSIS — R7309 Other abnormal glucose: Secondary | ICD-10-CM | POA: Diagnosis not present

## 2016-05-26 DIAGNOSIS — R011 Cardiac murmur, unspecified: Secondary | ICD-10-CM

## 2016-05-26 DIAGNOSIS — Z1239 Encounter for other screening for malignant neoplasm of breast: Secondary | ICD-10-CM | POA: Diagnosis not present

## 2016-05-26 HISTORY — DX: Hormone replacement therapy: Z79.890

## 2016-05-26 LAB — HEMOGLOBIN A1C
HEMOGLOBIN A1C: 5.8 % — AB (ref <5.7)
Mean Plasma Glucose: 120 mg/dL

## 2016-05-26 MED ORDER — CLOBETASOL PROPIONATE 0.05 % EX CREA: 1.0000 "application " | TOPICAL_CREAM | Freq: Two times a day (BID) | CUTANEOUS | Status: DC

## 2016-05-26 NOTE — Assessment & Plan Note (Signed)
Hx of Fen-Phen; yearly echo

## 2016-05-26 NOTE — Patient Instructions (Addendum)
We'll get labs today Contact Dr. Gustavo Lah about when your next colonoscopy is due Your last was done in October 2016 Also, talk to them about whether or not to wean off of the pantoprazole  Please do call to schedule your bone density study; the number to schedule one at either [facility] or [facility] is [phone]  I'll recommend that you start a coated baby 81 mg aspirin daily for stroke prevention  Start to wean the estrogen-testosterone little by little, decrease each month  We'll get an echocardiogram once a year   Health Maintenance, Female Adopting a healthy lifestyle and getting preventive care can go a long way to promote health and wellness. Talk with your health care provider about what schedule of regular examinations is right for you. This is a good chance for you to check in with your provider about disease prevention and staying healthy. In between checkups, there are plenty of things you can do on your own. Experts have done a lot of research about which lifestyle changes and preventive measures are most likely to keep you healthy. Ask your health care provider for more information. WEIGHT AND DIET  Eat a healthy diet  Be sure to include plenty of vegetables, fruits, low-fat dairy products, and lean protein.  Do not eat a lot of foods high in solid fats, added sugars, or salt.  Get regular exercise. This is one of the most important things you can do for your health.  Most adults should exercise for at least 150 minutes each week. The exercise should increase your heart rate and make you sweat (moderate-intensity exercise).  Most adults should also do strengthening exercises at least twice a week. This is in addition to the moderate-intensity exercise.  Maintain a healthy weight  Body mass index (BMI) is a measurement that can be used to identify possible weight problems. It estimates body fat based on height and weight. Your health  care provider can help determine your BMI and help you achieve or maintain a healthy weight.  For females 48 years of age and older:   A BMI below 18.5 is considered underweight.  A BMI of 18.5 to 24.9 is normal.  A BMI of 25 to 29.9 is considered overweight.  A BMI of 30 and above is considered obese.  Watch levels of cholesterol and blood lipids  You should start having your blood tested for lipids and cholesterol at 68 years of age, then have this test every 5 years.  You may need to have your cholesterol levels checked more often if:  Your lipid or cholesterol levels are high.  You are older than 68 years of age.  You are at high risk for heart disease.  CANCER SCREENING   Lung Cancer  Lung cancer screening is recommended for adults 68-57 years old who are at high risk for lung cancer because of a history of smoking.  A yearly low-dose CT scan of the lungs is recommended for people who:  Currently smoke.  Have quit within the past 15 years.  Have at least a 30-pack-year history of smoking. A pack year is smoking an average of one pack of cigarettes a day for 1 year.  Yearly screening should continue until it has been 15 years since you quit.  Yearly screening should stop if you develop a health problem that would prevent you from having lung cancer treatment.  Breast Cancer  Practice breast self-awareness. This means understanding how your breasts  normally appear and feel.  It also means doing regular breast self-exams. Let your health care provider know about any changes, no matter how small.  If you are in your 20s or 30s, you should have a clinical breast exam (CBE) by a health care provider every 1-3 years as part of a regular health exam.  If you are 12 or older, have a CBE every year. Also consider having a breast X-ray (mammogram) every year.  If you have a family history of breast cancer, talk to your health care provider about genetic  screening.  If you are at high risk for breast cancer, talk to your health care provider about having an MRI and a mammogram every 68 year.  Breast cancer gene (BRCA) assessment is recommended for women who have family members with BRCA-related cancers. BRCA-related cancers include:  Breast.  Ovarian.  Tubal.  Peritoneal cancers.  Results of the assessment will determine the need for genetic counseling and BRCA1 and BRCA2 testing. Cervical Cancer Your health care provider may recommend that you be screened regularly for cancer of the pelvic organs (ovaries, uterus, and vagina). This screening involves a pelvic examination, including checking for microscopic changes to the surface of your cervix (Pap test). You may be encouraged to have this screening done every 3 years, beginning at age 68.  For women ages 32-65, health care providers may recommend pelvic exams and Pap testing every 3 years, or they may recommend the Pap and pelvic exam, combined with testing for human papilloma virus (HPV), every 5 years. Some types of HPV increase your risk of cervical cancer. Testing for HPV may also be done on women of any age with unclear Pap test results.  Other health care providers may not recommend any screening for nonpregnant women who are considered low risk for pelvic cancer and who do not have symptoms. Ask your health care provider if a screening pelvic exam is right for you.  If you have had past treatment for cervical cancer or a condition that could lead to cancer, you need Pap tests and screening for cancer for at least 20 years after your treatment. If Pap tests have been discontinued, your risk factors (such as having a new sexual partner) need to be reassessed to determine if screening should resume. Some women have medical problems that increase the chance of getting cervical cancer. In these cases, your health care provider may recommend more frequent screening and Pap tests. Colorectal  Cancer  This type of cancer can be detected and often prevented.  Routine colorectal cancer screening usually begins at 68 years of age and continues through 68 years of age.  Your health care provider may recommend screening at an earlier age if you have risk factors for colon cancer.  Your health care provider may also recommend using home test kits to check for hidden blood in the stool.  A small camera at the end of a tube can be used to examine your colon directly (sigmoidoscopy or colonoscopy). This is done to check for the earliest forms of colorectal cancer.  Routine screening usually begins at age 17.  Direct examination of the colon should be repeated every 5-10 years through 68 years of age. However, you may need to be screened more often if early forms of precancerous polyps or small growths are found. Skin Cancer  Check your skin from head to toe regularly.  Tell your health care provider about any new moles or changes in moles, especially if there  a change in a mole's shape or color.  Also tell your health care provider if you have a mole that is larger than the size of a pencil eraser.  Always use sunscreen. Apply sunscreen liberally and repeatedly throughout the day.  Protect yourself by wearing long sleeves, pants, a wide-brimmed hat, and sunglasses whenever you are outside. HEART DISEASE, DIABETES, AND HIGH BLOOD PRESSURE   High blood pressure causes heart disease and increases the risk of stroke. High blood pressure is more likely to develop in:  People who have blood pressure in the high end of the normal range (130-139/85-89 mm Hg).  People who are overweight or obese.  People who are African American.  If you are 18-39 years of age, have your blood pressure checked every 3-5 years. If you are 40 years of age or older, have your blood pressure checked every year. You should have your blood pressure measured twice--once when you are at a hospital or clinic,  and once when you are not at a hospital or clinic. Record the average of the two measurements. To check your blood pressure when you are not at a hospital or clinic, you can use:  An automated blood pressure machine at a pharmacy.  A home blood pressure monitor.  If you are between 55 years and 79 years old, ask your health care provider if you should take aspirin to prevent strokes.  Have regular diabetes screenings. This involves taking a blood sample to check your fasting blood sugar level.  If you are at a normal weight and have a low risk for diabetes, have this test once every three years after 68 years of age.  If you are overweight and have a high risk for diabetes, consider being tested at a younger age or more often. PREVENTING INFECTION  Hepatitis B  If you have a higher risk for hepatitis B, you should be screened for this virus. You are considered at high risk for hepatitis B if:  You were born in a country where hepatitis B is common. Ask your health care provider which countries are considered high risk.  Your parents were born in a high-risk country, and you have not been immunized against hepatitis B (hepatitis B vaccine).  You have HIV or AIDS.  You use needles to inject street drugs.  You live with someone who has hepatitis B.  You have had sex with someone who has hepatitis B.  You get hemodialysis treatment.  You take certain medicines for conditions, including cancer, organ transplantation, and autoimmune conditions. Hepatitis C  Blood testing is recommended for:  Everyone born from 1945 through 1965.  Anyone with known risk factors for hepatitis C. Sexually transmitted infections (STIs)  You should be screened for sexually transmitted infections (STIs) including gonorrhea and chlamydia if:  You are sexually active and are younger than 68 years of age.  You are older than 68 years of age and your health care provider tells you that you are at risk  for this type of infection.  Your sexual activity has changed since you were last screened and you are at an increased risk for chlamydia or gonorrhea. Ask your health care provider if you are at risk.  If you do not have HIV, but are at risk, it may be recommended that you take a prescription medicine daily to prevent HIV infection. This is called pre-exposure prophylaxis (PrEP). You are considered at risk if:  You are sexually active and do not regularly   regularly use condoms or know the HIV status of your partner(s).  You take drugs by injection.  You are sexually active with a partner who has HIV. Talk with your health care provider about whether you are at high risk of being infected with HIV. If you choose to begin PrEP, you should first be tested for HIV. You should then be tested every 3 months for as long as you are taking PrEP.  PREGNANCY   If you are premenopausal and you may become pregnant, ask your health care provider about preconception counseling.  If you may become pregnant, take 400 to 800 micrograms (mcg) of folic acid every day.  If you want to prevent pregnancy, talk to your health care provider about birth control (contraception). OSTEOPOROSIS AND MENOPAUSE   Osteoporosis is a disease in which the bones lose minerals and strength with aging. This can result in serious bone fractures. Your risk for osteoporosis can be identified using a bone density scan.  If you are 34 years of age or older, or if you are at risk for osteoporosis and fractures, ask your health care provider if you should be screened.  Ask your health care provider whether you should take a calcium or vitamin D supplement to lower your risk for osteoporosis.  Menopause may have certain physical symptoms and risks.  Hormone replacement therapy may reduce some of these symptoms and risks. Talk to your health care provider about whether hormone replacement therapy is right for you.  HOME CARE INSTRUCTIONS    Schedule regular health, dental, and eye exams.  Stay current with your immunizations.   Do not use any tobacco products including cigarettes, chewing tobacco, or electronic cigarettes.  If you are pregnant, do not drink alcohol.  If you are breastfeeding, limit how much and how often you drink alcohol.  Limit alcohol intake to no more than 1 drink per day for nonpregnant women. One drink equals 12 ounces of beer, 5 ounces of wine, or 1 ounces of hard liquor.  Do not use street drugs.  Do not share needles.  Ask your health care provider for help if you need support or information about quitting drugs.  Tell your health care provider if you often feel depressed.  Tell your health care provider if you have ever been abused or do not feel safe at home.   This information is not intended to replace advice given to you by your health care provider. Make sure you discuss any questions you have with your health care provider.   Document Released: 06/08/2011 Document Revised: 12/14/2014 Document Reviewed: 10/25/2013 Elsevier Interactive Patient Education [employer].

## 2016-05-26 NOTE — Progress Notes (Signed)
Patient ID: Ruth Bishop, female   DOB: Sep 03, 1946, 70 y.o.   MRN: [id number]   Subjective:   Ruth Bishop is a 70 y.o. female here for a complete physical exam  Interim issues since last visit: itching in the left ear, feels like fluid coming out; saw ENT last year and did not find anything; no clicking or popping; jaw might click; dentist thinks TMJ  USPSTF grade A and B recommendations Alcohol: every now and then Depression:  Depression screen [facility] 2/9 05/26/2016 05/24/2015  Decreased Interest 0 0  Down, Depressed, Hopeless 0 0  PHQ - 2 Score 0 0   Hypertension: controlled on losartan Obesity: no, just overweight Tobacco use: never used HIV, hep B, hep C: hiv and hep C was done last year, reviewed, negative STD testing and prevention (chl/gon/syphilis):  Lipids: June 07, 2015, LDL was 75, HDL 51, total 148 Glucose: 101 last July, recheck, not completely fasting today Colorectal cancer: Oct 2016; not sure if next is 5 or 10 years Breast cancer: order in place, patient will call to schedule her own mammo Intimate partner violence: no Cervical cancer screening: s/p hyst Lung cancer: never smoker Osteoporosis: ordered dexa Fall prevention/vitamin D: discussed AAA: no fam hx Aspirin: recommend 81 mg Diet: likes to eat good food; loves milk, gets [place] daily, collards, spinach, broccoi Exercise: avid tennis player Skin cancer: no worrisome moles   Past Medical History:  Diagnosis Date  . Arthritis   . Diastolic dysfunction 99991111  . GERD (gastroesophageal reflux disease)   . Heart murmur   . Hypertension    Past Surgical History:  Procedure Laterality Date  . ABDOMINAL HYSTERECTOMY    . COLONOSCOPY WITH PROPOFOL N/A 10/01/2015   Procedure: COLONOSCOPY WITH PROPOFOL;  Surgeon: Lollie Sails, MD;  Location: [facility] ENDOSCOPY;  Service: Endoscopy;  Laterality: N/A;  . ESOPHAGOGASTRODUODENOSCOPY (EGD) WITH PROPOFOL N/A 10/01/2015   Procedure:  ESOPHAGOGASTRODUODENOSCOPY (EGD) WITH PROPOFOL;  Surgeon: Lollie Sails, MD;  Location[facility] ENDOSCOPY;  Service: Endoscopy;  Laterality: N/A;   Family History  Problem Relation Age of Onset  . Heart disease Mother   . Diabetes Mother   . Heart disease Father   . Heart disease Sister   . Diabetes Sister   . Heart disease Brother   . Diabetes Brother    Social History  Substance Use Topics  . Smoking status: Never Smoker  . Smokeless tobacco: Not on file  . Alcohol use 0.0 oz/week     Comment: socially   Review of Systems  Constitutional: Negative for unexpected weight change.  HENT: Negative for hearing loss and mouth sores.   Eyes: Negative for visual disturbance.  Respiratory: Negative for cough and wheezing.   Cardiovascular: Negative for leg swelling.  Gastrointestinal: Negative for blood in stool.  Endocrine: Positive for polyuria.  Genitourinary: Negative for hematuria.  Musculoskeletal: Positive for joint swelling (bad knees, left knee gets injections).  Skin:       No worrisome moles; has had a scar on the left shoulder, unchanged for years  Neurological: Negative for tremors.  Hematological: Does not bruise/bleed easily.  Psychiatric/Behavioral: Negative for dysphoric mood.   Objective:   Vitals:   05/26/16 1129  BP: 132/60  Pulse: 71  Resp: 14  Temp: 98.1 F (36.7 C)  TempSrc: Oral  SpO2: 98%  Weight: 201 lb (91.2 kg)   Body mass index is 29.68 kg/m. Wt Readings from Last 3 Encounters:  05/26/16 201 lb (91.2 kg)  01/03/16 203 lb  3.2 oz (92.2 kg)  10/01/15 197 lb (89.4 kg)   Physical Exam  Constitutional: She appears well-developed and well-nourished.  HENT:  Head: Normocephalic and atraumatic.  Right Ear: Hearing, tympanic membrane, external ear and ear canal normal.  Left Ear: Hearing, tympanic membrane, external ear and ear canal normal.  Eyes: Conjunctivae and EOM are normal. Right eye exhibits no hordeolum. Left eye exhibits no hordeolum.  No scleral icterus.  Neck: Carotid bruit is not present. No thyromegaly present.  Cardiovascular: Normal rate, regular rhythm, S1 normal, S2 normal and normal heart sounds.   No extrasystoles are present.  Pulmonary/Chest: Effort normal and breath sounds normal. No respiratory distress. Right breast exhibits no inverted nipple, no mass, no nipple discharge, no skin change and no tenderness. Left breast exhibits no inverted nipple, no mass, no nipple discharge, no skin change and no tenderness. Breasts are symmetrical.  Abdominal: Soft. Normal appearance and bowel sounds are normal. She exhibits no distension, no abdominal bruit, no pulsatile midline mass and no mass. There is no hepatosplenomegaly. There is no tenderness. No hernia.  Musculoskeletal: Normal range of motion. She exhibits no edema.  Lymphadenopathy:       Head (right side): No submandibular adenopathy present.       Head (left side): No submandibular adenopathy present.    She has no cervical adenopathy.    She has no axillary adenopathy.  Neurological: She is alert. She displays no tremor. No cranial nerve deficit. She exhibits normal muscle tone. Gait normal.  Reflex Scores:      Patellar reflexes are 2+ on the right side and 2+ on the left side. Skin: Skin is warm and dry. No bruising and no ecchymosis noted. No cyanosis. No pallor.  Psychiatric: Her speech is normal and behavior is normal. Thought content normal. Her mood appears not anxious. She does not exhibit a depressed mood.    Assessment/Plan:   Problem List Items Addressed This Visit      Other   Routine general medical examination at a health care facility    USPSTF grade A and B recommendations reviewed with patient; age-appropriate recommendations, preventive care, screening tests, etc discussed and encouraged; healthy living encouraged; see AVS for patient education given to patient      Hormone replacement therapy   Cardiac murmur    Hx of Fen-Phen; yearly  echo      Relevant Orders   ECHO COMPLETE (Completed)   Breast cancer screening    Other Visit Diagnoses    Preventative health care    -  Primary   physical done   Postmenopausal       order DEXA   Relevant Orders   DG Bone Density   Polyuria       check urine, glucose   Relevant Orders   Urinalysis with Culture Reflex (Completed)   Hyperglycemia       check glucose, a1c   Relevant Orders   Hemoglobin A1c (Completed)   Medication monitoring encounter       Relevant Orders   COMPLETE METABOLIC PANEL WITH GFR (Completed)      Meds ordered this encounter  Medications  . DISCONTD: clobetasol cream (TEMOVATE) 0.05 %    Sig: Apply 1 application topically 2 (two) times daily. PRN; too strong for face, under the arms, in the groin for more than 2 weeks; use sparingly    Dispense:  30 g    Refill:  0   Orders Placed This Encounter  Procedures  .  DG Bone Density    Order Specific Question:   Reason for Exam (SYMPTOM  OR DIAGNOSIS REQUIRED)    Answer:   screening for osteoporosis    Order Specific Question:   Preferred imaging location?    Answer:   [facility]  . Urinalysis with Culture Reflex    Standing Status:   Future    Number of Occurrences:   1    Standing Expiration Date:   06/02/2016  . Hemoglobin A1c    Standing Status:   Future    Number of Occurrences:   1    Standing Expiration Date:   06/02/2016  . COMPLETE METABOLIC PANEL WITH GFR    Standing Status:   Future    Number of Occurrences:   1    Standing Expiration Date:   06/02/2016  . ECHO COMPLETE    Standing Status:   Future    Number of Occurrences:   1    Standing Expiration Date:   08/26/2017    Order Specific Question:   Where should this test be performed    Answer:   [facility]    Order Specific Question:   Please indicate who you request to read the echo results.    Answer:   [facility]    Order Specific Question:   Complete or Limited study?    Answer:   Complete    Order  Specific Question:   Does the patient have a known history of hypersensitivity to Perflutren (Chief Technology Officer for echocardiograms - CHECK ALLERGIES)    Answer:   No    Order Specific Question:   ADMINISTER PERFLUTERN    Answer:   ADMINISTER PERFLUTREN    Comments:   if indicated    Order Specific Question:   Expected Date:    Answer:   1 week    Order Specific Question:   Reason for exam-Echo    Answer:   Murmur  785.2 / R01.1    Follow up plan: Return in about 1 year (around 05/26/2017) for complete physical; 6 months for blood pressure.  An After Visit Summary was printed and given to the patient.

## 2016-05-27 ENCOUNTER — Other Ambulatory Visit: Payer: Self-pay | Admitting: Family Medicine

## 2016-05-27 ENCOUNTER — Telehealth: Payer: Self-pay

## 2016-05-27 DIAGNOSIS — Z1239 Encounter for other screening for malignant neoplasm of breast: Secondary | ICD-10-CM

## 2016-05-27 LAB — COMPLETE METABOLIC PANEL WITH GFR
ALT: 11 U/L (ref 6–29)
AST: 15 U/L (ref 10–35)
Albumin: 4 g/dL (ref 3.6–5.1)
Alkaline Phosphatase: 89 U/L (ref 33–130)
BUN: 20 mg/dL (ref 7–25)
CHLORIDE: 101 mmol/L (ref 98–110)
CO2: 28 mmol/L (ref 20–31)
CREATININE: 0.96 mg/dL (ref 0.50–0.99)
Calcium: 9.3 mg/dL (ref 8.6–10.4)
GFR, Est African American: 71 mL/min (ref >=60)
GFR, Est Non African American: 61 mL/min (ref >=60)
GLUCOSE: 90 mg/dL (ref 65–99)
POTASSIUM: 4.5 mmol/L (ref 3.5–5.3)
SODIUM: 142 mmol/L (ref 135–146)
Total Bilirubin: 0.5 mg/dL (ref 0.2–1.2)
Total Protein: 6.9 g/dL (ref 6.1–8.1)

## 2016-05-27 LAB — URINALYSIS W MICROSCOPIC + REFLEX CULTURE
BACTERIA UA: NONE SEEN [HPF]
Bilirubin Urine: NEGATIVE
CRYSTALS: NONE SEEN [HPF]
Casts: NONE SEEN [LPF]
GLUCOSE, UA: NEGATIVE
Hgb urine dipstick: NEGATIVE
Ketones, ur: NEGATIVE
LEUKOCYTES UA: NEGATIVE
Nitrite: NEGATIVE
PROTEIN: NEGATIVE
SPECIFIC GRAVITY, URINE: 1.03 (ref 1.001–1.035)
YEAST: NONE SEEN [HPF]
pH: 5 (ref 5.0–8.0)

## 2016-05-27 NOTE — Telephone Encounter (Signed)
Pt states the temovate cream is to expensive is there anything cheaper you can prescribe?

## 2016-05-28 MED ORDER — TRIAMCINOLONE ACETONIDE 0.1 % EX CREA: 1.0000 "application " | TOPICAL_CREAM | Freq: Two times a day (BID) | CUTANEOUS | Status: DC

## 2016-05-28 NOTE — Telephone Encounter (Signed)
Left pt voicemail

## 2016-05-28 NOTE — Telephone Encounter (Signed)
Yes, new rx sent.

## 2016-06-02 ENCOUNTER — Telehealth: Payer: Self-pay

## 2016-06-02 NOTE — Telephone Encounter (Signed)
Pt states tried to call about whinning down pantoprazole from GI and they told her to make an appt and she states has a high co pay and cannot pay at this time can you whin her down

## 2016-06-05 MED ORDER — PANTOPRAZOLE SODIUM 40 MG PO TBEC: DELAYED_RELEASE_TABLET | ORAL | Status: DC

## 2016-06-05 NOTE — Telephone Encounter (Signed)
For the first month, have her take a pill daily for 2 days, then skip a day For example, she'd take a pill on Monday, take a pill on Tuesday, then skip Wednesday, and repeat that for the first month Next month, we'll wean down further She can use OTC Zantac in addition to this if needed

## 2016-06-05 NOTE — Telephone Encounter (Signed)
Pt.notified

## 2016-06-12 ENCOUNTER — Ambulatory Visit
Admission: RE | Admit: 2016-06-12 | Discharge: 2016-06-12 | Disposition: A | Discharge status: Home / Self Care | Payer: Commercial Managed Care - HMO | Source: Ambulatory Visit | Attending: Family Medicine | Admitting: Family Medicine

## 2016-06-12 DIAGNOSIS — I10 Essential (primary) hypertension: Secondary | ICD-10-CM | POA: Insufficient documentation

## 2016-06-12 DIAGNOSIS — K219 Gastro-esophageal reflux disease without esophagitis: Secondary | ICD-10-CM | POA: Diagnosis not present

## 2016-06-12 DIAGNOSIS — I340 Nonrheumatic mitral (valve) insufficiency: Secondary | ICD-10-CM | POA: Insufficient documentation

## 2016-06-12 DIAGNOSIS — R011 Cardiac murmur, unspecified: Secondary | ICD-10-CM

## 2016-06-12 DIAGNOSIS — I351 Nonrheumatic aortic (valve) insufficiency: Secondary | ICD-10-CM | POA: Insufficient documentation

## 2016-06-12 NOTE — Progress Notes (Signed)
*  PRELIMINARY RESULTS* Echocardiogram 2D Echocardiogram has been performed.  Sherrie Sport 06/12/2016, 11:38 AM

## 2016-06-17 ENCOUNTER — Encounter: Payer: Self-pay | Admitting: Family Medicine

## 2016-06-17 DIAGNOSIS — I351 Nonrheumatic aortic (valve) insufficiency: Secondary | ICD-10-CM | POA: Insufficient documentation

## 2016-06-17 DIAGNOSIS — I340 Nonrheumatic mitral (valve) insufficiency: Secondary | ICD-10-CM | POA: Insufficient documentation

## 2016-06-17 DIAGNOSIS — I5189 Other ill-defined heart diseases: Secondary | ICD-10-CM

## 2016-06-17 HISTORY — DX: Other ill-defined heart diseases: I51.89

## 2016-06-28 NOTE — Assessment & Plan Note (Signed)
USPSTF grade A and B recommendations reviewed with patient; age-appropriate recommendations, preventive care, screening tests, etc discussed and encouraged; healthy living encouraged; see AVS for patient education given to patient  

## 2016-07-01 ENCOUNTER — Ambulatory Visit
Admission: RE | Admit: 2016-07-01 | Discharge: 2016-07-01 | Disposition: A | Discharge status: Home / Self Care | Payer: Commercial Managed Care - HMO | Source: Ambulatory Visit | Attending: Family Medicine | Admitting: Family Medicine

## 2016-07-01 ENCOUNTER — Other Ambulatory Visit: Payer: Self-pay | Admitting: Family Medicine

## 2016-07-01 DIAGNOSIS — Z13820 Encounter for screening for osteoporosis: Secondary | ICD-10-CM | POA: Insufficient documentation

## 2016-07-01 DIAGNOSIS — Z1231 Encounter for screening mammogram for malignant neoplasm of breast: Secondary | ICD-10-CM | POA: Insufficient documentation

## 2016-07-01 DIAGNOSIS — Z1239 Encounter for other screening for malignant neoplasm of breast: Secondary | ICD-10-CM

## 2016-07-01 DIAGNOSIS — Z9079 Acquired absence of other genital organ(s): Secondary | ICD-10-CM | POA: Diagnosis not present

## 2016-07-01 DIAGNOSIS — Z780 Asymptomatic menopausal state: Secondary | ICD-10-CM | POA: Diagnosis not present

## 2016-07-24 ENCOUNTER — Other Ambulatory Visit: Payer: Self-pay

## 2016-07-25 MED ORDER — LOSARTAN POTASSIUM-HCTZ 100-12.5 MG PO TABS: 1.0000 | ORAL_TABLET | Freq: Every day | ORAL | 3 refills | Status: DC

## 2016-07-25 NOTE — Telephone Encounter (Signed)
Last Cr and K+ reviewed; Rx approved 

## 2016-09-28 ENCOUNTER — Telehealth: Payer: Self-pay | Admitting: Family Medicine

## 2016-09-28 DIAGNOSIS — M170 Bilateral primary osteoarthritis of knee: Secondary | ICD-10-CM

## 2016-09-28 NOTE — Telephone Encounter (Signed)
Patient needs new referral to another ortho for shot for knee Fax # [phone]  To [facility].  We just need to fax notes and she can call and setup appt.

## 2016-09-28 NOTE — Telephone Encounter (Signed)
Pt would like a call back about a referral

## 2016-09-28 NOTE — Assessment & Plan Note (Signed)
Refer to [facility], requested by patient

## 2016-09-28 NOTE — Telephone Encounter (Signed)
Referral entered at pt's request

## 2016-10-07 DIAGNOSIS — M170 Bilateral primary osteoarthritis of knee: Secondary | ICD-10-CM | POA: Diagnosis not present

## 2016-10-07 DIAGNOSIS — M25561 Pain in right knee: Secondary | ICD-10-CM | POA: Diagnosis not present

## 2016-10-07 DIAGNOSIS — M25562 Pain in left knee: Secondary | ICD-10-CM | POA: Diagnosis not present

## 2016-10-08 ENCOUNTER — Telehealth: Payer: Self-pay | Admitting: Family Medicine

## 2016-10-08 NOTE — Telephone Encounter (Signed)
Jonetta from [facility] requesting return call. Trying to get patient to a [facility] provider. (P) [phone]

## 2016-10-09 NOTE — Telephone Encounter (Signed)
I returned Jonetta's call and she stated that she approved visits with Dr. Mariea Clonts.

## 2016-10-13 ENCOUNTER — Telehealth: Payer: Self-pay

## 2016-10-13 NOTE — Telephone Encounter (Signed)
Erroneous entry

## 2016-11-11 ENCOUNTER — Ambulatory Visit (INDEPENDENT_AMBULATORY_CARE_PROVIDER_SITE_OTHER): Payer: Commercial Managed Care - HMO | Admitting: Family Medicine

## 2016-11-11 ENCOUNTER — Encounter: Payer: Self-pay | Admitting: Family Medicine

## 2016-11-11 VITALS — BP 128/60 | HR 67 | Temp 97.8°F | Resp 16 | Ht 68.5 in | Wt 198.6 lb

## 2016-11-11 DIAGNOSIS — I10 Essential (primary) hypertension: Secondary | ICD-10-CM | POA: Diagnosis not present

## 2016-11-11 DIAGNOSIS — Z5181 Encounter for therapeutic drug level monitoring: Secondary | ICD-10-CM | POA: Diagnosis not present

## 2016-11-11 DIAGNOSIS — N951 Menopausal and female climacteric states: Secondary | ICD-10-CM

## 2016-11-11 DIAGNOSIS — R7303 Prediabetes: Secondary | ICD-10-CM | POA: Diagnosis not present

## 2016-11-11 LAB — LIPID PANEL
CHOLESTEROL: 138 mg/dL (ref <200)
HDL: 58 mg/dL (ref >50)
LDL Cholesterol: 62 mg/dL (ref <100)
TRIGLYCERIDES: 88 mg/dL (ref <150)
Total CHOL/HDL Ratio: 2.4 Ratio (ref <5.0)
VLDL: 18 mg/dL (ref <30)

## 2016-11-11 LAB — COMPLETE METABOLIC PANEL WITH GFR
ALBUMIN: 3.7 g/dL (ref 3.6–5.1)
ALK PHOS: 89 U/L (ref 33–130)
ALT: 12 U/L (ref 6–29)
AST: 15 U/L (ref 10–35)
BUN: 19 mg/dL (ref 7–25)
CO2: 26 mmol/L (ref 20–31)
Calcium: 9.2 mg/dL (ref 8.6–10.4)
Chloride: 105 mmol/L (ref 98–110)
Creat: 0.84 mg/dL (ref 0.50–0.99)
GFR, Est African American: 83 mL/min (ref >=60)
GFR, Est Non African American: 72 mL/min (ref >=60)
GLUCOSE: 89 mg/dL (ref 65–99)
POTASSIUM: 4.1 mmol/L (ref 3.5–5.3)
SODIUM: 141 mmol/L (ref 135–146)
TOTAL PROTEIN: 6.6 g/dL (ref 6.1–8.1)
Total Bilirubin: 0.5 mg/dL (ref 0.2–1.2)

## 2016-11-11 MED ORDER — VENLAFAXINE HCL ER 37.5 MG PO CP24: 37.5000 mg | ORAL_CAPSULE | Freq: Every day | ORAL | 3 refills | Status: DC

## 2016-11-11 NOTE — Assessment & Plan Note (Signed)
Check fasting glucose, A1c, lipids; work on weight loss, avoid whites

## 2016-11-11 NOTE — Patient Instructions (Addendum)
Your goal blood pressure is less than 150 mmHg on top and the bottom number under 90 Try to follow the DASH guidelines (DASH stands for Dietary Approaches to Stop Hypertension) Try to limit the sodium in your diet.  Ideally, consume less than 1.5 grams (less than 1,500mg ) per day. Do not add salt when cooking or at the table.  Check the sodium amount on labels when shopping, and choose items lower in sodium when given a choice. Avoid or limit foods that already contain a lot of sodium. Eat a diet rich in fruits and vegetables and whole grains. Avoid black cohosh, black licorice Start the medicine for hot flashes Return in 2 weeks after starting the Effexor XR to make sure BP stays controlled  DASH Eating Plan DASH stands for "Dietary Approaches to Stop Hypertension." The DASH eating plan is a healthy eating plan that has been shown to reduce high blood pressure (hypertension). Additional health benefits may include reducing the risk of type 2 diabetes mellitus, heart disease, and stroke. The DASH eating plan may also help with weight loss. What do I need to know about the DASH eating plan? For the DASH eating plan, you will follow these general guidelines:  Choose foods with less than 150 milligrams of sodium per serving (as listed on the food label).  Use salt-free seasonings or herbs instead of table salt or sea salt.  Check with your health care provider or pharmacist before using salt substitutes.  Eat lower-sodium products. These are often labeled as "low-sodium" or "no salt added."  Eat fresh foods. Avoid eating a lot of canned foods.  Eat more vegetables, fruits, and low-fat dairy products.  Choose whole grains. Look for the word "whole" as the first word in the ingredient list.  Choose fish and skinless chicken or [place] more often than red meat. Limit fish, poultry, and meat to 6 oz (170 g) each day.  Limit sweets, desserts, sugars, and sugary drinks.  Choose heart-healthy  fats.  Eat more home-cooked food and less restaurant, buffet, and fast food.  Limit fried foods.  Do not fry foods. Cook foods using methods such as baking, boiling, grilling, and broiling instead.  When eating at a restaurant, ask that your food be prepared with less salt, or no salt if possible. What foods can I eat? Seek help from a dietitian for individual calorie needs. Grains  Whole grain or whole wheat bread. Brown rice. Whole grain or whole wheat pasta. Quinoa, bulgur, and whole grain cereals. Low-sodium cereals. Corn or whole wheat flour tortillas. Whole grain cornbread. Whole grain crackers. Low-sodium crackers. Vegetables  Fresh or frozen vegetables (raw, steamed, roasted, or grilled). Low-sodium or reduced-sodium tomato and vegetable juices. Low-sodium or reduced-sodium tomato sauce and paste. Low-sodium or reduced-sodium canned vegetables. Fruits  All fresh, canned (in natural juice), or frozen fruits. Meat and Other Protein Products  Ground beef (85% or leaner), grass-fed beef, or beef trimmed of fat. Skinless chicken or [place]. Ground chicken or [place]. Pork trimmed of fat. All fish and seafood. Eggs. Dried beans, peas, or lentils. Unsalted nuts and seeds. Unsalted canned beans. Dairy  Low-fat dairy products, such as skim or 1% milk, 2% or reduced-fat cheeses, low-fat ricotta or cottage cheese, or plain low-fat yogurt. Low-sodium or reduced-sodium cheeses. Fats and Oils  Tub margarines without trans fats. Light or reduced-fat mayonnaise and salad dressings (reduced sodium). Avocado. Safflower, olive, or canola oils. Natural peanut or almond butter. Other  Unsalted popcorn and pretzels. The items listed  above may not be a complete list of recommended foods or beverages. Contact your dietitian for more options.  What foods are not recommended? Grains  White bread. White pasta. White rice. Refined cornbread. Bagels and croissants. Crackers that contain trans fat. Vegetables   Creamed or fried vegetables. Vegetables in a cheese sauce. Regular canned vegetables. Regular canned tomato sauce and paste. Regular tomato and vegetable juices. Fruits  Canned fruit in light or heavy syrup. Fruit juice. Meat and Other Protein Products  Fatty cuts of meat. Ribs, chicken wings, bacon, sausage, bologna, salami, chitterlings, fatback, hot dogs, bratwurst, and packaged luncheon meats. Salted nuts and seeds. Canned beans with salt. Dairy  Whole or 2% milk, cream, half-and-half, and cream cheese. Whole-fat or sweetened yogurt. Full-fat cheeses or blue cheese. Nondairy creamers and whipped toppings. Processed cheese, cheese spreads, or cheese curds. Condiments  Onion and garlic salt, seasoned salt, table salt, and sea salt. Canned and packaged gravies. Worcestershire sauce. Tartar sauce. Barbecue sauce. Teriyaki sauce. Soy sauce, including reduced sodium. Steak sauce. Fish sauce. Oyster sauce. Cocktail sauce. Horseradish. Ketchup and mustard. Meat flavorings and tenderizers. Bouillon cubes. Hot sauce. Tabasco sauce. Marinades. Taco seasonings. Relishes. Fats and Oils  Butter, stick margarine, lard, shortening, ghee, and bacon fat. Coconut, palm kernel, or palm oils. Regular salad dressings. Other  Pickles and olives. Salted popcorn and pretzels. The items listed above may not be a complete list of foods and beverages to avoid. Contact your dietitian for more information.  Where can I find more information? National Heart, Lung, and Blood Institute: travelstabloid.com This information is not intended to replace advice given to you by your health care provider. Make sure you discuss any questions you have with your health care provider. Document Released: 11/12/2011 Document Revised: 04/30/2016 Document Reviewed: 09/27/2013 Elsevier Interactive Patient Education  2017 [employer].

## 2016-11-11 NOTE — Assessment & Plan Note (Signed)
Check labs 

## 2016-11-11 NOTE — Progress Notes (Signed)
BP 128/60 (BP Location: Left Arm, Patient Position: Sitting, Cuff Size: Normal)   Pulse 67   Temp 97.8 F (36.6 C) (Oral)   Resp 16   Ht 5' 8.5" (1.74 m)   Wt 198 lb 9 oz (90.1 kg)   BMI 29.75 kg/m    Subjective:    Patient ID: Ruth Bishop, female    DOB: 1946-04-29, 70 y.o.   MRN: [id number]  HPI: Ruth Bishop is a 70 y.o. female  No chief complaint on file. MD note: patient is here to get health biometric screening form for work done, with fasting labs She is a Firefighter, very active; on medicine for BP but asks if she still needs that; would like to come off if possible Continues to have some hot flashes, asks if anything she can try for that She just got over a cold; still has some mucous in her chest; humidifier in room helps a lot; has a slight heart murmur; things tend to settle in her chest; Mucinex for a few days; no fevers; no rash, just funny marks on thigh (asx)  Depression screen [facility] 2/9 11/11/2016 05/26/2016 05/24/2015  Decreased Interest 0 0 0  Down, Depressed, Hopeless 0 0 0  PHQ - 2 Score 0 0 0   Relevant past medical, surgical, family and social history reviewed Past Medical History:  Diagnosis Date  . Arthritis   . Diastolic dysfunction 99991111  . GERD (gastroesophageal reflux disease)   . Heart murmur   . Hypertension    Past Surgical History:  Procedure Laterality Date  . ABDOMINAL HYSTERECTOMY    . COLONOSCOPY WITH PROPOFOL N/A 10/01/2015   Procedure: COLONOSCOPY WITH PROPOFOL;  Surgeon: Lollie Sails, MD;  Location: [facility] ENDOSCOPY;  Service: Endoscopy;  Laterality: N/A;  . ESOPHAGOGASTRODUODENOSCOPY (EGD) WITH PROPOFOL N/A 10/01/2015   Procedure: ESOPHAGOGASTRODUODENOSCOPY (EGD) WITH PROPOFOL;  Surgeon: Lollie Sails, MD;  Location: [facility] ENDOSCOPY;  Service: Endoscopy;  Laterality: N/A;   Family History  Problem Relation Age of Onset  . Heart disease Mother   . Diabetes Mother   . Heart disease Father   . Heart disease Sister    . Diabetes Sister   . Heart disease Brother   . Diabetes Brother   . Breast cancer Neg Hx    Social History  Substance Use Topics  . Smoking status: Never Smoker  . Smokeless tobacco: Not on file  . Alcohol use 0.0 oz/week     Comment: socially   Interim medical history since last visit reviewed. Allergies and medications reviewed  Review of Systems Per HPI unless specifically indicated above     Objective:    BP 128/60 (BP Location: Left Arm, Patient Position: Sitting, Cuff Size: Normal)   Pulse 67   Temp 97.8 F (36.6 C) (Oral)   Resp 16   Ht 5' 8.5" (1.74 m)   Wt 198 lb 9 oz (90.1 kg)   BMI 29.75 kg/m   Wt Readings from Last 3 Encounters:  11/11/16 198 lb 9 oz (90.1 kg)  05/26/16 201 lb (91.2 kg)  01/03/16 203 lb 3.2 oz (92.2 kg)    Physical Exam  Constitutional: She appears well-developed and well-nourished. No distress.  HENT:  Nose: No rhinorrhea.  Mouth/Throat: Oropharynx is clear and moist and mucous membranes are normal.  Eyes: EOM are normal. No scleral icterus.  Cardiovascular: Normal rate and regular rhythm.   Pulmonary/Chest: Effort normal and breath sounds normal.  Abdominal: She exhibits no distension.  Lymphadenopathy:  She has no cervical adenopathy.  Skin: No pallor.  Three small macular areas of mild erythema on thighs; no scale, no petechiae  Psychiatric: She has a normal mood and affect. Her behavior is normal. Judgment and thought content normal.      Assessment & Plan:   Problem List Items Addressed This Visit      Cardiovascular and Mediastinum   Hypertension goal BP (blood pressure) < 150/90 - Primary    Initially, had planned to reduce BP med at patient's request; try the DASH guidelines; check creatinine and K+; after discussion of her hot flashes and initiation of effexor, we'll leave the antihypertensives alone and consider reducing BP meds in the near future      Relevant Medications   losartan-hydrochlorothiazide  (HYZAAR) 100-12.5 MG tablet     Other   Prediabetes    Check fasting glucose, A1c, lipids; work on weight loss, avoid whites      Relevant Orders   Lipid panel (Completed)   Hemoglobin A1c (Completed)   Post menopausal syndrome    With hot flashes; will start low dose effexor; at first, thought about decreasing her BP medicine, but will see how her BP reacts on this low dose before reducing anti-hypertensives      Medication monitoring encounter    Check labs      Relevant Orders   COMPLETE METABOLIC PANEL WITH GFR (Completed)      Follow up plan: No Follow-up on file.  An after-visit summary was printed and given to the patient at [place].  Please see the patient instructions which may contain other information and recommendations beyond what is mentioned above in the assessment and plan.  Meds ordered this encounter  Medications  . losartan-hydrochlorothiazide (HYZAAR) 100-12.5 MG tablet    Sig: Take 1 tablet by mouth daily.    New instructions  . venlafaxine XR (EFFEXOR XR) 37.5 MG 24 hr capsule    Sig: Take 1 capsule (37.5 mg total) by mouth daily with breakfast.    Dispense:  90 capsule    Refill:  3    Orders Placed This Encounter  Procedures  . Lipid panel  . Hemoglobin A1c  . COMPLETE METABOLIC PANEL WITH GFR

## 2016-11-11 NOTE — Assessment & Plan Note (Addendum)
Initially, had planned to reduce BP med at patient's request; try the DASH guidelines; check creatinine and K+; after discussion of her hot flashes and initiation of effexor, we'll leave the antihypertensives alone and consider reducing BP meds in the near future

## 2016-11-12 LAB — HEMOGLOBIN A1C
Hgb A1c MFr Bld: 5.4 % (ref <5.7)
Mean Plasma Glucose: 108 mg/dL

## 2016-11-13 ENCOUNTER — Ambulatory Visit: Payer: Self-pay | Admitting: Family Medicine

## 2016-11-15 NOTE — Assessment & Plan Note (Signed)
With hot flashes; will start low dose effexor; at first, thought about decreasing her BP medicine, but will see how her BP reacts on this low dose before reducing anti-hypertensives

## 2016-11-25 ENCOUNTER — Ambulatory Visit: Payer: Self-pay | Admitting: Family Medicine

## 2017-01-13 ENCOUNTER — Encounter: Payer: Self-pay | Admitting: Family Medicine

## 2017-01-13 ENCOUNTER — Ambulatory Visit (INDEPENDENT_AMBULATORY_CARE_PROVIDER_SITE_OTHER): Payer: Medicare HMO | Admitting: Family Medicine

## 2017-01-13 VITALS — BP 118/46 | HR 75 | Temp 98.1°F | Resp 14 | Wt 204.0 lb

## 2017-01-13 DIAGNOSIS — I10 Essential (primary) hypertension: Secondary | ICD-10-CM | POA: Diagnosis not present

## 2017-01-13 DIAGNOSIS — M170 Bilateral primary osteoarthritis of knee: Secondary | ICD-10-CM

## 2017-01-13 DIAGNOSIS — H9202 Otalgia, left ear: Secondary | ICD-10-CM | POA: Diagnosis not present

## 2017-01-13 MED ORDER — FLUCONAZOLE 150 MG PO TABS: 150.0000 mg | ORAL_TABLET | Freq: Once | ORAL | 0 refills | Status: AC

## 2017-01-13 MED ORDER — AMOXICILLIN-POT CLAVULANATE 875-125 MG PO TABS: 1.0000 | ORAL_TABLET | Freq: Two times a day (BID) | ORAL | 0 refills | Status: DC

## 2017-01-13 NOTE — Assessment & Plan Note (Signed)
With lower than normal BP today; will decrease the antihypertensive; she'll monitor at home and contact me

## 2017-01-13 NOTE — Progress Notes (Signed)
BP (!) 118/46   Pulse 75   Temp 98.1 F (36.7 C) (Oral)   Resp 14   Wt 204 lb (92.5 kg)   SpO2 97%   BMI 30.57 kg/m    Subjective:    Patient ID: Ruth Bishop, female    DOB: 10-14-1946, 71 y.o.   MRN: [id number]  HPI: Ruth Bishop is a 71 y.o. female  Chief Complaint  Patient presents with  . Ear Pain    left ear pain and hearing loss  . chest congestion   Here for an acute visit; has pain over the left ear, left lateral side of the neck Just got over a cold; had a lot of mucous in her chest; took mucinex; ended up in her throat Now the ear is bothering her Can't lay on the left side; not sure, but might have slept funny last night, some discomfort over the left lateral neck, posterior shoulder; she has not seen any blisters or vesicles Feels like stuff is pouring is out of ear but nothing actually there No rash at all; she has had the shingles vaccine She went to ENT a few years ago, similar then but worse now No fever today; she gets hot flashes from time; had to stop effexor Blood pressure lower than usual for her; not much appetite She has knee arthritis and the orthopaedist has offered her injections to give her some "cushion" in her knees; she asked my opinion (I cannot render any opinion, because I had never heard of the injection she showed me on the brochure)  Depression screen [facility] 2/9 01/13/2017 11/11/2016 05/26/2016 05/24/2015  Decreased Interest 0 0 0 0  Down, Depressed, Hopeless 0 0 0 0  PHQ - 2 Score 0 0 0 0   Relevant past medical, surgical, family and social history reviewed Past Medical History:  Diagnosis Date  . Arthritis   . Diastolic dysfunction 99991111  . GERD (gastroesophageal reflux disease)   . Heart murmur   . Hypertension    Past Surgical History:  Procedure Laterality Date  . ABDOMINAL HYSTERECTOMY    . COLONOSCOPY WITH PROPOFOL N/A 10/01/2015   Procedure: COLONOSCOPY WITH PROPOFOL;  Surgeon: Lollie Sails, MD;  Location: [facility]  ENDOSCOPY;  Service: Endoscopy;  Laterality: N/A;  . ESOPHAGOGASTRODUODENOSCOPY (EGD) WITH PROPOFOL N/A 10/01/2015   Procedure: ESOPHAGOGASTRODUODENOSCOPY (EGD) WITH PROPOFOL;  Surgeon: Lollie Sails, MD;  Location: [facility] ENDOSCOPY;  Service: Endoscopy;  Laterality: N/A;   Social History  Substance Use Topics  . Smoking status: Never Smoker  . Smokeless tobacco: Never Used  . Alcohol use 0.0 oz/week     Comment: socially   Interim medical history since last visit reviewed. Allergies and medications reviewed  Review of Systems Per HPI unless specifically indicated above     Objective:    BP (!) 118/46   Pulse 75   Temp 98.1 F (36.7 C) (Oral)   Resp 14   Wt 204 lb (92.5 kg)   SpO2 97%   BMI 30.57 kg/m   Wt Readings from Last 3 Encounters:  01/13/17 204 lb (92.5 kg)  11/11/16 198 lb 9 oz (90.1 kg)  05/26/16 201 lb (91.2 kg)    Physical Exam  Constitutional: She appears well-developed and well-nourished.  HENT:  Head: Normocephalic and atraumatic.  Right Ear: Hearing, tympanic membrane, external ear and ear canal normal. Tympanic membrane is not injected, not erythematous and not retracted.  Left Ear: Hearing, external ear and ear canal normal. Tympanic membrane  is not injected, not erythematous and not retracted. A middle ear effusion is present.  Nose: Rhinorrhea present.  Mouth/Throat: Oropharynx is clear and moist and mucous membranes are normal.  Eyes: EOM are normal. No scleral icterus.  Cardiovascular: Normal rate and regular rhythm.   Pulmonary/Chest: Effort normal and breath sounds normal. She has no decreased breath sounds. She has no wheezes. She has no rhonchi.  Lymphadenopathy:    She has cervical adenopathy.       Left cervical: Superficial cervical adenopathy present.  1+ AC LAD on the left only  Skin: No rash noted.  Specifically, no rash on the side of the neck or shoulder  Psychiatric: She has a normal mood and affect. Her behavior is normal. Her  mood appears not anxious. She does not exhibit a depressed mood.      Assessment & Plan:   Problem List Items Addressed This Visit      Cardiovascular and Mediastinum   Hypertension goal BP (blood pressure) < 150/90    With lower than normal BP today; will decrease the antihypertensive; she'll monitor at home and contact me        Musculoskeletal and Integument   Osteoarthritis of both knees    I cannot render an opinion about the knee injections, so she'll talk to her orthopaedist       Other Visit Diagnoses    Acute ear pain, left    -  Primary   with effusion, solitary cervical lymph node; possible infection; ddx includes shingles w/o outbreak of vesicles yet; watch for new s/s, call if not better soon       Follow up plan: No Follow-up on file.  An after-visit summary was printed and given to the patient at [facility].  Please see the patient instructions which may contain other information and recommendations beyond what is mentioned above in the assessment and plan.  Meds ordered this encounter  Medications  . amoxicillin-clavulanate (AUGMENTIN) 875-125 MG tablet    Sig: Take 1 tablet by mouth 2 (two) times daily.    Dispense:  20 tablet    Refill:  0  . fluconazole (DIFLUCAN) 150 MG tablet    Sig: Take 1 tablet (150 mg total) by mouth once.    Dispense:  1 tablet    Refill:  0

## 2017-01-13 NOTE — Patient Instructions (Signed)
Start the medicine Please do eat yogurt daily or take a probiotic daily for the next month or two We want to replace the healthy germs in the gut If you notice foul, watery diarrhea in the next two months, schedule an appointment RIGHT AWAY Watch for blisters on the side of the neck or shoulder and call right if this turns out to be shingles

## 2017-01-13 NOTE — Assessment & Plan Note (Signed)
I cannot render an opinion about the knee injections, so she'll talk to her orthopaedist

## 2017-01-29 ENCOUNTER — Ambulatory Visit (INDEPENDENT_AMBULATORY_CARE_PROVIDER_SITE_OTHER): Payer: Medicare HMO | Admitting: Family Medicine

## 2017-01-29 ENCOUNTER — Encounter: Payer: Self-pay | Admitting: Family Medicine

## 2017-01-29 VITALS — BP 122/60 | HR 71 | Temp 97.7°F | Resp 14 | Wt 203.0 lb

## 2017-01-29 DIAGNOSIS — I10 Essential (primary) hypertension: Secondary | ICD-10-CM

## 2017-01-29 DIAGNOSIS — H9202 Otalgia, left ear: Secondary | ICD-10-CM | POA: Insufficient documentation

## 2017-01-29 DIAGNOSIS — H918X2 Other specified hearing loss, left ear: Secondary | ICD-10-CM

## 2017-01-29 NOTE — Progress Notes (Signed)
BP 122/60   Pulse 71   Temp 97.7 F (36.5 C) (Oral)   Resp 14   Wt 203 lb (92.1 kg)   SpO2 97%   BMI 30.42 kg/m    Subjective:    Patient ID: Ruth Bishop, female    DOB: 09-Aug-1946, 71 y.o.   MRN: [id number]  HPI: Ruth Bishop is a 71 y.o. female  Chief Complaint  Patient presents with  . Ear Pain    left side radiates to head and neck   She is having ear pain on the left side, head and neck; started January 2016; has not been as bad; asked if you can get arthritis in the ear; she gets a deep pain deep in the head, couldn't eat on the left side; felt like the sensation of fluid, but no actual fluid; no fevers; just hot flashes; trouble hearing, sounds muffled; no ringing or high pitched noises Pain is a 4 out of 10 Tried drops from Jan 2016 Tried tylenol arthritis; heating pad helped a little Worse with chewing on the left, worse more at night at the end of the day Cannot lay on the left side at all No hx of blisters or shingles on that side  She is on just half of a pill of her BP medicine now Patient brought in [employer] BP monitor and we measured and it seriously off today; verified by MD  Depression screen [facility] 2/9 01/29/2017 01/13/2017 11/11/2016 05/26/2016 05/24/2015  Decreased Interest 0 0 0 0 0  Down, Depressed, Hopeless 0 0 0 0 0  PHQ - 2 Score 0 0 0 0 0   Relevant past medical, surgical, family and social history reviewed Past Medical History:  Diagnosis Date  . Arthritis   . Diastolic dysfunction 99991111  . GERD (gastroesophageal reflux disease)   . Heart murmur   . Hypertension    Family History  Problem Relation Age of Onset  . Heart disease Mother   . Diabetes Mother   . Heart disease Father   . Heart disease Sister   . Diabetes Sister   . Heart disease Brother   . Diabetes Brother   . Breast cancer Neg Hx    Social History  Substance Use Topics  . Smoking status: Never Smoker  . Smokeless tobacco: Never Used  . Alcohol use 0.0 oz/week   Comment: socially   Interim medical history since last visit reviewed. Allergies and medications reviewed  Review of Systems Per HPI unless specifically indicated above     Objective:    BP 122/60   Pulse 71   Temp 97.7 F (36.5 C) (Oral)   Resp 14   Wt 203 lb (92.1 kg)   SpO2 97%   BMI 30.42 kg/m   Wt Readings from Last 3 Encounters:  01/29/17 203 lb (92.1 kg)  01/13/17 204 lb (92.5 kg)  11/11/16 198 lb 9 oz (90.1 kg)    Physical Exam  Constitutional: She appears well-developed and well-nourished.  HENT:  Head: Normocephalic and atraumatic.  Right Ear: Hearing, tympanic membrane, external ear and ear canal normal. Tympanic membrane is not injected, not erythematous and not retracted.  Left Ear: Hearing, external ear and ear canal normal. Tympanic membrane is not injected, not erythematous and not retracted. A middle ear effusion is present.  Mouth/Throat: Oropharynx is clear and moist and mucous membranes are normal.  Swelling over the left preauricular area, mildly tender  Eyes: EOM are normal. No scleral icterus.  Cardiovascular: Regular  rhythm.   Pulmonary/Chest: Effort normal.  Lymphadenopathy:    She has no cervical adenopathy.  Skin: No rash noted.  Specifically, no rash on the side of the neck or shoulder  Psychiatric: She has a normal mood and affect. Her behavior is normal. Her mood appears not anxious. She does not exhibit a depressed mood.      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  An after-visit summary was printed and given to the patient at [facility].  Please see the patient instructions which may contain other information and recommendations beyond what is mentioned above in the assessment and plan.  No orders of the defined types were placed in this encounter.   Orders Placed This Encounter  Procedures  . Ambulatory referral to ENT  . Hearing screening

## 2017-01-29 NOTE — Assessment & Plan Note (Signed)
Pain and swelling over the left lateral side of the face, preauricular area; chronic and going on since Jan 2016

## 2017-01-29 NOTE — Patient Instructions (Addendum)
Monitor your blood pressure I do recommend a different cuff Ideal BP is less than 140/90 Our next step can be to stop the current combo medicine and start plain losartan 25 mg so just call with an update when you need more medicine Try the DASH guidelines

## 2017-02-03 DIAGNOSIS — M170 Bilateral primary osteoarthritis of knee: Secondary | ICD-10-CM | POA: Diagnosis not present

## 2017-02-03 DIAGNOSIS — M25561 Pain in right knee: Secondary | ICD-10-CM | POA: Diagnosis not present

## 2017-02-03 DIAGNOSIS — M25562 Pain in left knee: Secondary | ICD-10-CM | POA: Diagnosis not present

## 2017-02-08 NOTE — Assessment & Plan Note (Signed)
Her cuff is not accurate; suggested she get a different cuff; try DASH guidelines

## 2017-02-10 DIAGNOSIS — M1711 Unilateral primary osteoarthritis, right knee: Secondary | ICD-10-CM | POA: Diagnosis not present

## 2017-02-10 DIAGNOSIS — M25561 Pain in right knee: Secondary | ICD-10-CM | POA: Diagnosis not present

## 2017-02-12 ENCOUNTER — Other Ambulatory Visit: Payer: Self-pay | Admitting: Otolaryngology

## 2017-02-12 DIAGNOSIS — M26622 Arthralgia of left temporomandibular joint: Secondary | ICD-10-CM | POA: Diagnosis not present

## 2017-02-12 DIAGNOSIS — K118 Other diseases of salivary glands: Secondary | ICD-10-CM

## 2017-02-12 DIAGNOSIS — H9202 Otalgia, left ear: Secondary | ICD-10-CM

## 2017-02-12 DIAGNOSIS — H903 Sensorineural hearing loss, bilateral: Secondary | ICD-10-CM | POA: Diagnosis not present

## 2017-02-17 DIAGNOSIS — M1712 Unilateral primary osteoarthritis, left knee: Secondary | ICD-10-CM | POA: Diagnosis not present

## 2017-02-17 DIAGNOSIS — M25562 Pain in left knee: Secondary | ICD-10-CM | POA: Diagnosis not present

## 2017-02-24 DIAGNOSIS — M25561 Pain in right knee: Secondary | ICD-10-CM | POA: Diagnosis not present

## 2017-02-24 DIAGNOSIS — M1711 Unilateral primary osteoarthritis, right knee: Secondary | ICD-10-CM | POA: Diagnosis not present

## 2017-03-03 DIAGNOSIS — M25562 Pain in left knee: Secondary | ICD-10-CM | POA: Diagnosis not present

## 2017-03-03 DIAGNOSIS — M1712 Unilateral primary osteoarthritis, left knee: Secondary | ICD-10-CM | POA: Diagnosis not present

## 2017-03-05 ENCOUNTER — Ambulatory Visit
Admission: RE | Admit: 2017-03-05 | Discharge: 2017-03-05 | Disposition: A | Discharge status: Home / Self Care | Payer: Medicare HMO | Source: Ambulatory Visit | Attending: Otolaryngology | Admitting: Otolaryngology

## 2017-03-05 DIAGNOSIS — H9202 Otalgia, left ear: Secondary | ICD-10-CM | POA: Diagnosis not present

## 2017-03-05 DIAGNOSIS — K118 Other diseases of salivary glands: Secondary | ICD-10-CM | POA: Diagnosis not present

## 2017-03-05 DIAGNOSIS — M1990 Unspecified osteoarthritis, unspecified site: Secondary | ICD-10-CM | POA: Insufficient documentation

## 2017-03-05 DIAGNOSIS — J320 Chronic maxillary sinusitis: Secondary | ICD-10-CM | POA: Diagnosis not present

## 2017-03-05 DIAGNOSIS — H9201 Otalgia, right ear: Secondary | ICD-10-CM | POA: Diagnosis not present

## 2017-03-05 LAB — POCT I-STAT CREATININE: Creatinine, Ser: 0.8 mg/dL (ref 0.44–1.00)

## 2017-03-05 MED ORDER — IOPAMIDOL (ISOVUE-300) INJECTION 61%
75.0000 mL | Freq: Once | INTRAVENOUS | Status: AC | PRN
  Administered 2017-03-05: 75 mL via INTRAVENOUS

## 2017-03-10 DIAGNOSIS — M25561 Pain in right knee: Secondary | ICD-10-CM | POA: Diagnosis not present

## 2017-03-10 DIAGNOSIS — M170 Bilateral primary osteoarthritis of knee: Secondary | ICD-10-CM | POA: Diagnosis not present

## 2017-03-10 DIAGNOSIS — M25562 Pain in left knee: Secondary | ICD-10-CM | POA: Diagnosis not present

## 2017-04-23 ENCOUNTER — Telehealth: Payer: Self-pay | Admitting: Family Medicine

## 2017-04-23 ENCOUNTER — Other Ambulatory Visit: Payer: Self-pay

## 2017-04-23 MED ORDER — HYOSCYAMINE SULFATE ER 0.375 MG PO TBCR: 1.0000 | EXTENDED_RELEASE_TABLET | Freq: Two times a day (BID) | ORAL | 1 refills | Status: DC | PRN

## 2017-04-23 NOTE — Telephone Encounter (Signed)
Pt is requesting a refill on Hyoscyamine to be sent to [facility].

## 2017-04-27 NOTE — Telephone Encounter (Signed)
Pt refill for hyoscyamine was sent to the wrong Pharmacy. It was suppose to go to [place] off [address] instead it went to [facility]. Cancel the order with [facility] and called a verbal at [facility]. Pt has been notified.

## 2017-05-27 ENCOUNTER — Other Ambulatory Visit: Payer: Self-pay | Admitting: Family Medicine

## 2017-05-27 DIAGNOSIS — Z1231 Encounter for screening mammogram for malignant neoplasm of breast: Secondary | ICD-10-CM

## 2017-07-02 ENCOUNTER — Ambulatory Visit
Admission: RE | Admit: 2017-07-02 | Discharge: 2017-07-02 | Disposition: A | Discharge status: Home / Self Care | Payer: Medicare HMO | Source: Ambulatory Visit | Attending: Family Medicine | Admitting: Family Medicine

## 2017-07-02 DIAGNOSIS — Z1231 Encounter for screening mammogram for malignant neoplasm of breast: Secondary | ICD-10-CM | POA: Insufficient documentation

## 2017-07-05 LAB — HM MAMMOGRAPHY

## 2017-08-04 ENCOUNTER — Encounter: Payer: Self-pay | Admitting: Family Medicine

## 2017-08-04 ENCOUNTER — Ambulatory Visit (INDEPENDENT_AMBULATORY_CARE_PROVIDER_SITE_OTHER): Payer: Medicare HMO | Admitting: Family Medicine

## 2017-08-04 VITALS — BP 118/58 | HR 69 | Temp 97.6°F | Resp 14 | Ht 69.0 in | Wt 207.1 lb

## 2017-08-04 DIAGNOSIS — R7303 Prediabetes: Secondary | ICD-10-CM | POA: Diagnosis not present

## 2017-08-04 DIAGNOSIS — R911 Solitary pulmonary nodule: Secondary | ICD-10-CM | POA: Diagnosis not present

## 2017-08-04 DIAGNOSIS — Z0000 Encounter for general adult medical examination without abnormal findings: Secondary | ICD-10-CM | POA: Diagnosis not present

## 2017-08-04 DIAGNOSIS — R918 Other nonspecific abnormal finding of lung field: Secondary | ICD-10-CM | POA: Diagnosis not present

## 2017-08-04 DIAGNOSIS — I10 Essential (primary) hypertension: Secondary | ICD-10-CM

## 2017-08-04 DIAGNOSIS — Z23 Encounter for immunization: Secondary | ICD-10-CM

## 2017-08-04 HISTORY — DX: Solitary pulmonary nodule: R91.1

## 2017-08-04 NOTE — Patient Instructions (Addendum)
Stop the blood pressure pill Monitor your blood pressure and contact me if trending up over 140/90 We'll get the chest CT to follow-up on the lung nodule; do not lose sleep over this Health Maintenance, Female Adopting a healthy lifestyle and getting preventive care can go a long way to promote health and wellness. Talk with your health care provider about what schedule of regular examinations is right for you. This is a good chance for you to check in with your provider about disease prevention and staying healthy. In between checkups, there are plenty of things you can do on your own. Experts have done a lot of research about which lifestyle changes and preventive measures are most likely to keep you healthy. Ask your health care provider for more information. Weight and diet Eat a healthy diet  Be sure to include plenty of vegetables, fruits, low-fat dairy products, and lean protein.  Do not eat a lot of foods high in solid fats, added sugars, or salt.  Get regular exercise. This is one of the most important things you can do for your health. ? Most adults should exercise for at least 150 minutes each week. The exercise should increase your heart rate and make you sweat (moderate-intensity exercise). ? Most adults should also do strengthening exercises at least twice a week. This is in addition to the moderate-intensity exercise.  Maintain a healthy weight  Body mass index (BMI) is a measurement that can be used to identify possible weight problems. It estimates body fat based on height and weight. Your health care provider can help determine your BMI and help you achieve or maintain a healthy weight.  For females 69 years of age and older: ? A BMI below 18.5 is considered underweight. ? A BMI of 18.5 to 24.9 is normal. ? A BMI of 25 to 29.9 is considered overweight. ? A BMI of 30 and above is considered obese.  Watch levels of cholesterol and blood lipids  You should start having  your blood tested for lipids and cholesterol at 69 years of age, then have this test every 5 years.  You may need to have your cholesterol levels checked more often if: ? Your lipid or cholesterol levels are high. ? You are older than 69 years of age. ? You are at high risk for heart disease.  Cancer screening Lung Cancer  Lung cancer screening is recommended for adults 69-51 years old who are at high risk for lung cancer because of a history of smoking.  A yearly low-dose CT scan of the lungs is recommended for people who: ? Currently smoke. ? Have quit within the past 15 years. ? Have at least a 30-pack-year history of smoking. A pack year is smoking an average of one pack of cigarettes a day for 1 year.  Yearly screening should continue until it has been 15 years since you quit.  Yearly screening should stop if you develop a health problem that would prevent you from having lung cancer treatment.  Breast Cancer  Practice breast self-awareness. This means understanding how your breasts normally appear and feel.  It also means doing regular breast self-exams. Let your health care provider know about any changes, no matter how small.  If you are in your 20s or 30s, you should have a clinical breast exam (CBE) by a health care provider every 1-3 years as part of a regular health exam.  If you are 69 or older, have a CBE every year. Also  consider having a breast X-ray (mammogram) every year.  If you have a family history of breast cancer, talk to your health care provider about genetic screening.  If you are at high risk for breast cancer, talk to your health care provider about having an MRI and a mammogram every year.  Breast cancer gene (BRCA) assessment is recommended for women who have family members with BRCA-related cancers. BRCA-related cancers include: ? Breast. ? Ovarian. ? Tubal. ? Peritoneal cancers.  Results of the assessment will determine the need for genetic  counseling and BRCA1 and BRCA2 testing.  Cervical Cancer Your health care provider may recommend that you be screened regularly for cancer of the pelvic organs (ovaries, uterus, and vagina). This screening involves a pelvic examination, including checking for microscopic changes to the surface of your cervix (Pap test). You may be encouraged to have this screening done every 3 years, beginning at age 75.  For women ages 29-65, health care providers may recommend pelvic exams and Pap testing every 3 years, or they may recommend the Pap and pelvic exam, combined with testing for human papilloma virus (HPV), every 5 years. Some types of HPV increase your risk of cervical cancer. Testing for HPV may also be done on women of any age with unclear Pap test results.  Other health care providers may not recommend any screening for nonpregnant women who are considered low risk for pelvic cancer and who do not have symptoms. Ask your health care provider if a screening pelvic exam is right for you.  If you have had past treatment for cervical cancer or a condition that could lead to cancer, you need Pap tests and screening for cancer for at least 20 years after your treatment. If Pap tests have been discontinued, your risk factors (such as having a new sexual partner) need to be reassessed to determine if screening should resume. Some women have medical problems that increase the chance of getting cervical cancer. In these cases, your health care provider may recommend more frequent screening and Pap tests.  Colorectal Cancer  This type of cancer can be detected and often prevented.  Routine colorectal cancer screening usually begins at 69 years of age and continues through 69 years of age.  Your health care provider may recommend screening at an earlier age if you have risk factors for colon cancer.  Your health care provider may also recommend using home test kits to check for hidden blood in the  stool.  A small camera at the end of a tube can be used to examine your colon directly (sigmoidoscopy or colonoscopy). This is done to check for the earliest forms of colorectal cancer.  Routine screening usually begins at age 70.  Direct examination of the colon should be repeated every 5-10 years through 69 years of age. However, you may need to be screened more often if early forms of precancerous polyps or small growths are found.  Skin Cancer  Check your skin from head to toe regularly.  Tell your health care provider about any new moles or changes in moles, especially if there is a change in a mole's shape or color.  Also tell your health care provider if you have a mole that is larger than the size of a pencil eraser.  Always use sunscreen. Apply sunscreen liberally and repeatedly throughout the day.  Protect yourself by wearing long sleeves, pants, a wide-brimmed hat, and sunglasses whenever you are outside.  Heart disease, diabetes, and high blood  pressure  High blood pressure causes heart disease and increases the risk of stroke. High blood pressure is more likely to develop in: ? People who have blood pressure in the high end of the normal range (130-139/85-89 mm Hg). ? People who are overweight or obese. ? People who are African American.  If you are 7-7 years of age, have your blood pressure checked every 3-5 years. If you are 31 years of age or older, have your blood pressure checked every year. You should have your blood pressure measured twice-once when you are at a hospital or clinic, and once when you are not at a hospital or clinic. Record the average of the two measurements. To check your blood pressure when you are not at a hospital or clinic, you can use: ? An automated blood pressure machine at a pharmacy. ? A home blood pressure monitor.  If you are between 21 years and 47 years old, ask your health care provider if you should take aspirin to prevent  strokes.  Have regular diabetes screenings. This involves taking a blood sample to check your fasting blood sugar level. ? If you are at a normal weight and have a low risk for diabetes, have this test once every three years after 69 years of age. ? If you are overweight and have a high risk for diabetes, consider being tested at a younger age or more often. Preventing infection Hepatitis B  If you have a higher risk for hepatitis B, you should be screened for this virus. You are considered at high risk for hepatitis B if: ? You were born in a country where hepatitis B is common. Ask your health care provider which countries are considered high risk. ? Your parents were born in a high-risk country, and you have not been immunized against hepatitis B (hepatitis B vaccine). ? You have HIV or AIDS. ? You use needles to inject street drugs. ? You live with someone who has hepatitis B. ? You have had sex with someone who has hepatitis B. ? You get hemodialysis treatment. ? You take certain medicines for conditions, including cancer, organ transplantation, and autoimmune conditions.  Hepatitis C  Blood testing is recommended for: ? Everyone born from [birth year] through 1963. ? Anyone with known risk factors for hepatitis C.  Sexually transmitted infections (STIs)  You should be screened for sexually transmitted infections (STIs) including gonorrhea and chlamydia if: ? You are sexually active and are younger than 69 years of age. ? You are older than 69 years of age and your health care provider tells you that you are at risk for this type of infection. ? Your sexual activity has changed since you were last screened and you are at an increased risk for chlamydia or gonorrhea. Ask your health care provider if you are at risk.  If you do not have HIV, but are at risk, it may be recommended that you take a prescription medicine daily to prevent HIV infection. This is called pre-exposure prophylaxis  (PrEP). You are considered at risk if: ? You are sexually active and do not regularly use condoms or know the HIV status of your partner(s). ? You take drugs by injection. ? You are sexually active with a partner who has HIV.  Talk with your health care provider about whether you are at high risk of being infected with HIV. If you choose to begin PrEP, you should first be tested for HIV. You should then be tested every  3 months for as long as you are taking PrEP. Pregnancy  If you are premenopausal and you may become pregnant, ask your health care provider about preconception counseling.  If you may become pregnant, take 400 to 800 micrograms (mcg) of folic acid every day.  If you want to prevent pregnancy, talk to your health care provider about birth control (contraception). Osteoporosis and menopause  Osteoporosis is a disease in which the bones lose minerals and strength with aging. This can result in serious bone fractures. Your risk for osteoporosis can be identified using a bone density scan.  If you are 11 years of age or older, or if you are at risk for osteoporosis and fractures, ask your health care provider if you should be screened.  Ask your health care provider whether you should take a calcium or vitamin D supplement to lower your risk for osteoporosis.  Menopause may have certain physical symptoms and risks.  Hormone replacement therapy may reduce some of these symptoms and risks. Talk to your health care provider about whether hormone replacement therapy is right for you. Follow these instructions at home:  Schedule regular health, dental, and eye exams.  Stay current with your immunizations.  Do not use any tobacco products including cigarettes, chewing tobacco, or electronic cigarettes.  If you are pregnant, do not drink alcohol.  If you are breastfeeding, limit how much and how often you drink alcohol.  Limit alcohol intake to no more than 1 drink per day for  nonpregnant women. One drink equals 12 ounces of beer, 5 ounces of wine, or 1 ounces of hard liquor.  Do not use street drugs.  Do not share needles.  Ask your health care provider for help if you need support or information about quitting drugs.  Tell your health care provider if you often feel depressed.  Tell your health care provider if you have ever been abused or do not feel safe at home. This information is not intended to replace advice given to you by your health care provider. Make sure you discuss any questions you have with your health care provider. Document Released: 06/08/2011 Document Revised: 04/30/2016 Document Reviewed: 08/27/2015 Elsevier Interactive Patient Education  [employer].

## 2017-08-04 NOTE — Progress Notes (Signed)
Patient: Ruth Bishop, Female    DOB: 04/06/1946, 71 y.o.   MRN: [id number]  Visit Date: 08/04/2017  Today's Provider: Enid Derry, MD   Chief Complaint  Patient presents with  . Medicare Wellness    Subjective:   Ruth Bishop is a 71 y.o. female who presents today for her Subsequent Annual Wellness Visit.  Patient/Caregiver input:  Nothing of concern to her friends  HPI  Review of Systems  Respiratory: Negative for shortness of breath.   Cardiovascular: Negative for chest pain.  Gastrointestinal: Negative for blood in stool.       Uses activia if stomach queasy   Past Medical History:  Diagnosis Date  . Arthritis   . Diastolic dysfunction 1/77/9390  . GERD (gastroesophageal reflux disease)   . Heart murmur   . Hypertension   . Incidental lung nodule, > 20mm and < 56mm 08/04/2017   4 mm ground glass nodule LLL incidentally noted on CT scan Nov 2016  pressure is doing well, has not even taken her medicine today  Past Surgical History:  Procedure Laterality Date  . ABDOMINAL HYSTERECTOMY    . COLONOSCOPY WITH PROPOFOL N/A 10/01/2015   Procedure: COLONOSCOPY WITH PROPOFOL;  Surgeon: Lollie Sails, MD;  Location: [facility] ENDOSCOPY;  Service: Endoscopy;  Laterality: N/A;  . ESOPHAGOGASTRODUODENOSCOPY (EGD) WITH PROPOFOL N/A 10/01/2015   Procedure: ESOPHAGOGASTRODUODENOSCOPY (EGD) WITH PROPOFOL;  Surgeon: Lollie Sails, MD;  Location: [facility] ENDOSCOPY;  Service: Endoscopy;  Laterality: N/A;  hyst for NON-cancerous reasons  Family History  Problem Relation Age of Onset  . Heart disease Mother   . Diabetes Mother   . Aneurysm Mother   . Heart disease Father   . Heart disease Sister   . Diabetes Sister   . Pneumonia Sister   . Heart disease Brother   . Diabetes Brother   . Diabetes Sister   . Heart disease Sister   . Breast cancer Neg Hx   patient taking aspirin  Social History   Social History  . Marital status: Widowed    Spouse name: N/A  . Number of  children: N/A  . Years of education: N/A   Occupational History  . Not on file.   Social History Main Topics  . Smoking status: Never Smoker  . Smokeless tobacco: Never Used  . Alcohol use 0.0 oz/week     Comment: socially  . Drug use: No  . Sexual activity: Not Currently   Other Topics Concern  . Not on file   Social History Narrative  . No narrative on file    Outpatient Encounter Prescriptions as of 08/04/2017  Medication Sig Note  . Calcium Carbonate-Vitamin D (CALTRATE 600+D) 600-400 MG-UNIT per tablet Take by mouth. 05/24/2015: Received from: [facility]  . Hyoscyamine Sulfate 0.375 MG TBCR Take 1 tablet (0.375 mg total) by mouth 2 (two) times daily as needed.   . MULTIPLE VITAMIN PO Take by mouth. 05/24/2015: Received from: [facility]  . triamcinolone cream (KENALOG) 0.1 % Apply 1 application topically 2 (two) times daily. If needed   . [DISCONTINUED] losartan-hydrochlorothiazide (HYZAAR) 100-12.5 MG tablet Take 0.5 tablets by mouth daily.    No facility-administered encounter medications on file as of 08/04/2017.     Allergies  Allergen Reactions  . Nitrofurantoin [facility] Team Updated in EHR: Yes  Last Vision Exam: Sept 2017  Wears corrective lenses: Yes Last Dental Exam: yearly, June Last Hearing Exam: referred to ENT; results came in, "  everything fine" Wears Hearing Aids: No  ADLs: ambulation, bathing and hygiene, feeding, continence, grooming, toileting and dressing  Functional Ability / Safety Screening 1.  Was the timed Get Up and Go test shorter than 30 seconds?  yes 2.  Does the patient need help with the phone, transportation, shopping,      preparing meals, housework, laundry, medications, or managing money?  no 3.  Is the patient's home free of loose throw rugs in walkways, pet beds, electrical cords, etc?   yes      Grab bars in the bathroom? yes      Handrails on the stairs?   no stairs       Adequate lighting?   yes 4.  Has the patient noticed any hearing difficulties?   no  Diet Recall and Exercise Regimen: she plays tennis and goes to the gym; good eater, fish, chicken, broccoli  Advanced Directives: A voluntary discussion about advance care planning including the explanation and discussion of advance directives was extensively discussed with the patient.  During this discussion, the patient was able to identify a health care proxy as daughter. Does patient have a HCPOA?    yes If yes, name and contact information: daughter, [place], Gelene Mink [phone] Granddaughter is Rodrigo Ran, [phone]  Does patient have a living will or MOST form?  yes  She would want CPR, defibrillation, etc.; does not want intubation or ventilator She does not want to go on a ventilator Chest compressions and defibrillation is fine, no intubation or mechanical ventilation  Cancer Screenings:  Skin: no worrisome Lung: remote, just tried Low Dose CT Chest recommended if Age 55-80 years, 30 pack-year currently smoking OR have quit w/in 15years. Patient does not qualify.  Lifestyle risk factor issued reviewed: Diet, exercise, weight management, advised patient smoking is not healthy, nutrition/diet.   Breast: no lumps Up to date on Mammogram? Yes   Up to date of Bone Density/Dexa? Yes Colon: 2016  Additional Screenings:  Hepatitis B/HIV/Syphillis: declined Hepatitis C Screening: done Intimate Partner Violence: no abuse  Objective:   Vitals: BP (!) 118/58   Pulse 69   Temp 97.6 F (36.4 C) (Oral)   Resp 14   Ht 5\' 9"  (1.753 m)   Wt 207 lb 1.6 oz (93.9 kg)   SpO2 97%   BMI 30.58 kg/m  Body mass index is 30.58 kg/m.  No exam data present  Physical Exam  Constitutional: She appears well-developed and well-nourished. No distress.  Cardiovascular: Normal rate.   Pulmonary/Chest: Effort normal. Right breast exhibits no inverted nipple, no mass, no nipple discharge,  no skin change and no tenderness. Left breast exhibits no inverted nipple, no mass, no nipple discharge, no skin change and no tenderness. Breasts are symmetrical.  Neurological: She is alert.    Cognitive Testing - 6-CIT  Correct? Score   What year is it? yes 0 Yes = 0    No = 4  What month is it? yes 0 Yes = 0    No = 3  Remember:     Pia Mau, [place]     What time is it? yes 0 Yes = 0    No = 3  Count backwards from 20 to 1 no 2 Correct = 0    1 error = 2   More than 1 error = 4  Say the months of the year in reverse. yes 0 Correct = 0    1 error = 2  More than 1 error = 4  What address did I ask you to remember? yes 2 Correct = 0  1 error = 2    2 error = 4    3 error = 6    4 error = 8    All wrong = 10       TOTAL SCORE  2/28   Interpretation:  Normal  Normal (0-7) Abnormal (8-28)   Fall Risk: Fall Risk  08/04/2017 01/29/2017 01/13/2017 11/11/2016 05/26/2016  Falls in the past year? No No No No No  Number falls in past yr: - - - - -  Injury with Fall? - - - - -    Depression Screen Depression screen [facility] 2/9 08/04/2017 01/29/2017 01/13/2017 11/11/2016 05/26/2016  Decreased Interest 0 0 0 0 0  Down, Depressed, Hopeless 0 0 0 0 0  PHQ - 2 Score 0 0 0 0 0    Recent Results (from the past 2160 hour(s))  HM MAMMOGRAPHY     Status: None   Collection Time: 07/05/17 12:00 AM  Result Value Ref Range   HM Mammogram 0-4 Bi-Rad 0-4 Bi-Rad, Self Reported Normal    Assessment & Plan:    Problem List Items Addressed This Visit      Cardiovascular and Mediastinum   Hypertension goal BP (blood pressure) < 150/90    Check lipids and creatinine; stop the medicine because she is eating well and exercising      Relevant Orders   COMPLETE METABOLIC PANEL WITH GFR   Lipid panel     Other   Prediabetes    Check A1c today      Relevant Orders   Hemoglobin A1c   Incidental lung nodule, > 73mm and < 21mm    Discussed CT findings from [place] [facility] from 2016; will get chest  CT to follow-up      Encounter for preventive care - Primary    USPSTF grade A and B recommendations reviewed with patient; age-appropriate recommendations, preventive care, screening tests, etc discussed and encouraged; healthy living encouraged; see AVS for patient education given to patient        Other Visit Diagnoses    Needs flu shot       Relevant Orders   Flu vaccine HIGH DOSE PF (Fluzone High dose) (Completed)   Abnormal findings on diagnostic imaging of lung       Relevant Orders   CT Chest Wo Contrast      Flu vaccine Already done before I walked in the room - Flu vaccine HIGH DOSE PF (Fluzone High dose)   Exercise Activities and Dietary recommendations Goals    None      Immunization History  Administered Date(s) Administered  . Influenza, High Dose Seasonal PF 08/04/2017  . Influenza-Unspecified 10/23/2015, 10/06/2016  . Pneumococcal Conjugate-13 10/10/2014  . Pneumococcal Polysaccharide-23 05/24/2015, 10/23/2015  . Tdap 05/24/2015    Health Maintenance  Topic Date Due  . MAMMOGRAM  07/05/2018  . TETANUS/TDAP  05/23/2025  . COLONOSCOPY  09/30/2025  . INFLUENZA VACCINE  Addressed  . DEXA SCAN  Completed  . Hepatitis C Screening  Completed  . PNA vac Low Risk Adult  Addressed    Discussed health benefits of physical activity, and encouraged her to engage in regular exercise appropriate for her age and condition.   No orders of the defined types were placed in this encounter.   Current Outpatient Prescriptions:  .  Calcium Carbonate-Vitamin D (CALTRATE 600+D) 600-400 MG-UNIT per  tablet, Take by mouth., Disp: , Rfl:  .  Hyoscyamine Sulfate 0.375 MG TBCR, Take 1 tablet (0.375 mg total) by mouth 2 (two) times daily as needed., Disp: 100 each, Rfl: 1 .  MULTIPLE VITAMIN PO, Take by mouth., Disp: , Rfl:  .  triamcinolone cream (KENALOG) 0.1 %, Apply 1 application topically 2 (two) times daily. If needed, Disp: 45 g, Rfl: 1 Medications Discontinued  During This Encounter  Medication Reason  . losartan-hydrochlorothiazide (HYZAAR) 100-12.5 MG tablet Discontinued by provider    I have personally reviewed and addressed the Medicare Annual Wellness health risk assessment questionnaire and have noted the following in the patient's chart:  A.         Medical and social history & family history B.         Use of alcohol, tobacco, and illicit drugs  C.         Current medications and supplements D.         Functional and Cognitive ability and status E.         Nutritional status F.         Physical activity G.        Advance directives H.         List of other physicians I.          Hospitalizations, surgeries, and ER visits in previous 12 months J.         [place] such as hearing, vision, cognitive function, and depression L.         Referrals and appointments: chest CT for f/u of incidental lung nodule  In addition, I have reviewed and discussed with patient certain preventive protocols, quality metrics, and best practice recommendations. A written personalized care plan for preventive services as well as general preventive health recommendations were provided to patient.  See attached scanned questionnaire for additional information.   Return in about 1 year (around 08/04/2018) for Medicare Wellness check.

## 2017-08-04 NOTE — Assessment & Plan Note (Signed)
Check lipids and creatinine; stop the medicine because she is eating well and exercising

## 2017-08-04 NOTE — Assessment & Plan Note (Signed)
USPSTF grade A and B recommendations reviewed with patient; age-appropriate recommendations, preventive care, screening tests, etc discussed and encouraged; healthy living encouraged; see AVS for patient education given to patient  

## 2017-08-04 NOTE — Assessment & Plan Note (Signed)
Discussed CT findings from [facility] from 2016; will get chest CT to follow-up

## 2017-08-04 NOTE — Assessment & Plan Note (Signed)
Check A1c today.

## 2017-08-05 LAB — LIPID PANEL
Cholesterol: 142 mg/dL (ref <200)
HDL: 60 mg/dL (ref >50)
LDL CALC: 55 mg/dL (ref <100)
TRIGLYCERIDES: 135 mg/dL (ref <150)
Total CHOL/HDL Ratio: 2.4 Ratio (ref <5.0)
VLDL: 27 mg/dL (ref <30)

## 2017-08-05 LAB — COMPLETE METABOLIC PANEL WITH GFR
ALT: 13 U/L (ref 6–29)
AST: 15 U/L (ref 10–35)
Albumin: 3.8 g/dL (ref 3.6–5.1)
Alkaline Phosphatase: 108 U/L (ref 33–130)
BILIRUBIN TOTAL: 0.5 mg/dL (ref 0.2–1.2)
BUN: 17 mg/dL (ref 7–25)
CHLORIDE: 105 mmol/L (ref 98–110)
CO2: 23 mmol/L (ref 20–32)
CREATININE: 0.93 mg/dL (ref 0.50–0.99)
Calcium: 9.1 mg/dL (ref 8.6–10.4)
GFR, EST AFRICAN AMERICAN: 73 mL/min (ref >=60)
GFR, Est Non African American: 63 mL/min (ref >=60)
GLUCOSE: 90 mg/dL (ref 65–99)
Potassium: 4.2 mmol/L (ref 3.5–5.3)
SODIUM: 141 mmol/L (ref 135–146)
TOTAL PROTEIN: 6.5 g/dL (ref 6.1–8.1)

## 2017-08-05 LAB — HEMOGLOBIN A1C
HEMOGLOBIN A1C: 5.4 % (ref <5.7)
Mean Plasma Glucose: 108 mg/dL

## 2017-08-06 ENCOUNTER — Telehealth: Payer: Self-pay | Admitting: Family Medicine

## 2017-08-06 DIAGNOSIS — H9202 Otalgia, left ear: Secondary | ICD-10-CM

## 2017-08-06 NOTE — Telephone Encounter (Signed)
I spoke with patient after reviewing the ENT notes; she says CT was normal; refer to neuro per ENT recommendation

## 2017-08-06 NOTE — Assessment & Plan Note (Signed)
Seen by ENT; she reports normal CT scan; ENT notes received, says refer to neuro if CT normal; talked with patient, will refer

## 2017-08-18 ENCOUNTER — Ambulatory Visit
Admission: RE | Admit: 2017-08-18 | Discharge: 2017-08-18 | Disposition: A | Discharge status: Home / Self Care | Payer: Medicare HMO | Source: Ambulatory Visit | Attending: Family Medicine | Admitting: Family Medicine

## 2017-08-18 DIAGNOSIS — R911 Solitary pulmonary nodule: Secondary | ICD-10-CM | POA: Insufficient documentation

## 2017-08-18 DIAGNOSIS — R918 Other nonspecific abnormal finding of lung field: Secondary | ICD-10-CM | POA: Diagnosis not present

## 2017-08-23 DIAGNOSIS — H938X2 Other specified disorders of left ear: Secondary | ICD-10-CM | POA: Diagnosis not present

## 2017-08-23 DIAGNOSIS — E538 Deficiency of other specified B group vitamins: Secondary | ICD-10-CM | POA: Diagnosis not present

## 2017-08-23 DIAGNOSIS — E559 Vitamin D deficiency, unspecified: Secondary | ICD-10-CM | POA: Diagnosis not present

## 2017-08-27 ENCOUNTER — Other Ambulatory Visit: Payer: Self-pay | Admitting: Neurology

## 2017-08-27 DIAGNOSIS — H938X2 Other specified disorders of left ear: Secondary | ICD-10-CM

## 2017-08-30 ENCOUNTER — Telehealth: Payer: Self-pay

## 2017-08-30 NOTE — Telephone Encounter (Signed)
Pt states that she has a slight heart murmur and she get's EKG every year. I don't see in our new system but in our old system  The last one was 05/25/2014. Pt states she forgot to mention this to you at her CPE but would like to know if she needs to schedule an apt with you.

## 2017-08-30 NOTE — Telephone Encounter (Signed)
That's fine for her to return for CMA visit to just get an EKG; dx: heart murmur; thank you

## 2017-08-31 NOTE — Telephone Encounter (Signed)
Pt scheduled appt for this coming Friday for nurse visit EKG

## 2017-09-01 ENCOUNTER — Ambulatory Visit: Payer: Medicare HMO

## 2017-09-03 ENCOUNTER — Ambulatory Visit (INDEPENDENT_AMBULATORY_CARE_PROVIDER_SITE_OTHER): Payer: Medicare HMO

## 2017-09-03 DIAGNOSIS — I517 Cardiomegaly: Secondary | ICD-10-CM

## 2017-09-03 DIAGNOSIS — R011 Cardiac murmur, unspecified: Secondary | ICD-10-CM | POA: Diagnosis not present

## 2017-09-03 DIAGNOSIS — R9431 Abnormal electrocardiogram [ECG] [EKG]: Secondary | ICD-10-CM

## 2017-09-03 DIAGNOSIS — I440 Atrioventricular block, first degree: Secondary | ICD-10-CM

## 2017-09-17 ENCOUNTER — Telehealth: Payer: Self-pay | Admitting: Family Medicine

## 2017-09-17 DIAGNOSIS — I351 Nonrheumatic aortic (valve) insufficiency: Secondary | ICD-10-CM

## 2017-09-17 DIAGNOSIS — I340 Nonrheumatic mitral (valve) insufficiency: Secondary | ICD-10-CM

## 2017-09-17 DIAGNOSIS — I440 Atrioventricular block, first degree: Secondary | ICD-10-CM

## 2017-09-17 DIAGNOSIS — I517 Cardiomegaly: Secondary | ICD-10-CM | POA: Insufficient documentation

## 2017-09-17 NOTE — Assessment & Plan Note (Signed)
echo

## 2017-09-17 NOTE — Telephone Encounter (Signed)
-----   Message from Dennard Schaumann, [place] sent at 09/14/2017 11:45 AM EDT ----- I just got a call from [facility] stating that this patient is refusing to be seen and just wants an Echo.  Please advise.

## 2017-09-17 NOTE — Telephone Encounter (Signed)
I respect her autonomy and accept her refusal ECHO ordered Thank you

## 2017-09-20 ENCOUNTER — Other Ambulatory Visit: Payer: Self-pay

## 2017-09-20 DIAGNOSIS — I517 Cardiomegaly: Secondary | ICD-10-CM

## 2017-09-20 DIAGNOSIS — I340 Nonrheumatic mitral (valve) insufficiency: Secondary | ICD-10-CM

## 2017-09-20 DIAGNOSIS — I351 Nonrheumatic aortic (valve) insufficiency: Secondary | ICD-10-CM

## 2017-09-20 DIAGNOSIS — I440 Atrioventricular block, first degree: Secondary | ICD-10-CM

## 2017-09-29 ENCOUNTER — Ambulatory Visit: Payer: Medicare HMO

## 2017-10-07 ENCOUNTER — Ambulatory Visit
Admission: RE | Admit: 2017-10-07 | Discharge: 2017-10-07 | Disposition: A | Discharge status: Home / Self Care | Payer: Medicare HMO | Source: Ambulatory Visit | Attending: Family Medicine | Admitting: Family Medicine

## 2017-10-07 DIAGNOSIS — I517 Cardiomegaly: Secondary | ICD-10-CM

## 2017-10-07 DIAGNOSIS — G4730 Sleep apnea, unspecified: Secondary | ICD-10-CM | POA: Diagnosis not present

## 2017-10-07 DIAGNOSIS — E079 Disorder of thyroid, unspecified: Secondary | ICD-10-CM | POA: Insufficient documentation

## 2017-10-07 DIAGNOSIS — N189 Chronic kidney disease, unspecified: Secondary | ICD-10-CM | POA: Insufficient documentation

## 2017-10-07 DIAGNOSIS — J449 Chronic obstructive pulmonary disease, unspecified: Secondary | ICD-10-CM | POA: Diagnosis not present

## 2017-10-07 DIAGNOSIS — I340 Nonrheumatic mitral (valve) insufficiency: Secondary | ICD-10-CM | POA: Diagnosis not present

## 2017-10-07 DIAGNOSIS — D649 Anemia, unspecified: Secondary | ICD-10-CM | POA: Diagnosis not present

## 2017-10-07 DIAGNOSIS — K219 Gastro-esophageal reflux disease without esophagitis: Secondary | ICD-10-CM | POA: Insufficient documentation

## 2017-10-07 DIAGNOSIS — E785 Hyperlipidemia, unspecified: Secondary | ICD-10-CM | POA: Insufficient documentation

## 2017-10-07 DIAGNOSIS — F419 Anxiety disorder, unspecified: Secondary | ICD-10-CM | POA: Diagnosis not present

## 2017-10-07 DIAGNOSIS — I1310 Hypertensive heart and chronic kidney disease without heart failure, with stage 1 through stage 4 chronic kidney disease, or unspecified chronic kidney disease: Secondary | ICD-10-CM | POA: Insufficient documentation

## 2017-10-07 DIAGNOSIS — I351 Nonrheumatic aortic (valve) insufficiency: Secondary | ICD-10-CM

## 2017-10-07 DIAGNOSIS — I440 Atrioventricular block, first degree: Secondary | ICD-10-CM | POA: Diagnosis not present

## 2017-10-07 NOTE — Progress Notes (Signed)
*  PRELIMINARY RESULTS* Echocardiogram 2D Echocardiogram has been performed.  Sherrie Sport 10/07/2017, 11:56 AM

## 2018-02-03 ENCOUNTER — Ambulatory Visit: Payer: Self-pay | Admitting: *Deleted

## 2018-02-03 NOTE — Telephone Encounter (Signed)
Called in c/o being very tired since returning from vacation.   "All I want to do is sleep".   "Everything makes me tired".    She at one point said she was having upper chest pressure but when further questioned about it she said I'm not having that now.   "I'm just so tired".    She repeatedly says, "I'm so tired and just want to sleep all the time".    I usually go to the gym and play tennis".   "This is just not normal for me".  I made her an appt with Dr. Sanda Klein for 02/04/18 at 1:40PM.   Reason for Disposition . [1] Chest pain lasting <= 5 minutes AND [2] NO chest pain or cardiac symptoms now(Exceptions: pains lasting a few seconds)  Answer Assessment - Initial Assessment Questions 1. LOCATION: "Where does it hurt?"       It hurts in the upper center of my chest.   I'm just so tired.   When I lay down and rest the sensation in my chest goes away.   I just want to sleep all the time.   I have no energy. 2. RADIATION: "Does the pain go anywhere else?" (e.g., into neck, jaw, arms, back)     I'm just so tired.    When I came back from vacation I really noticed it.   I had a chest cold back in December.   It cleared up.   3. ONSET: "When did the chest pain begin?" (Minutes, hours or days)      She repeatedly says she is just tired.    When I start doing things I get so tired so fast. 4. PATTERN "Does the pain come and go, or has it been constant since it started?"  "Does it get worse with exertion?"      I don't feel it now because I'm resting. 5. DURATION: "How long does it last" (e.g., seconds, minutes, hours)     When I get up and start doing stuff I need to lay down. 6. SEVERITY: "How bad is the pain?"  (e.g., Scale 1-10; mild, moderate, or severe)    - MILD (1-3): doesn't interfere with normal activities     - MODERATE (4-7): interferes with normal activities or awakens from sleep    - SEVERE (8-10): excruciating pain, unable to do any normal activities       I'm just so tired.   She is  repeatedly saying this same thing over and over.     7. CARDIAC RISK FACTORS: "Do you have any history of heart problems or risk factors for heart disease?" (e.g., prior heart attack, angina; high blood pressure, diabetes, being overweight, high cholesterol, smoking, or strong family history of heart disease)     Many years ago I had inflammation around my heart muscle.   That was many, many years ago.    If I get a cold and it settles into my chest I come see the doctor.    8. PULMONARY RISK FACTORS: "Do you have any history of lung disease?"  (e.g., blood clots in lung, asthma, emphysema, birth control pills)     I usually go to the gym and play tennis.   Now I have to force myself to do everything. 9. CAUSE: "What do you think is causing the chest pain?"     I don't know 10. OTHER SYMPTOMS: "Do you have any other symptoms?" (e.g., dizziness, nausea, vomiting, sweating, fever,  difficulty breathing, cough)       No dizziness.   If I start to do something I start feeling really tired.   I have shortness of breath.    I just don't feel normal. 11. PREGNANCY: "Is there any chance you are pregnant?" "When was your last menstrual period?"       N/A  Protocols used: CHEST PAIN-A-AH

## 2018-02-04 ENCOUNTER — Encounter: Payer: Self-pay | Admitting: Family Medicine

## 2018-02-04 ENCOUNTER — Ambulatory Visit (INDEPENDENT_AMBULATORY_CARE_PROVIDER_SITE_OTHER): Payer: Medicare HMO | Admitting: Family Medicine

## 2018-02-04 VITALS — BP 145/81 | HR 79 | Temp 97.4°F | Resp 14 | Wt 207.9 lb

## 2018-02-04 DIAGNOSIS — M2560 Stiffness of unspecified joint, not elsewhere classified: Secondary | ICD-10-CM | POA: Diagnosis not present

## 2018-02-04 DIAGNOSIS — R0789 Other chest pain: Secondary | ICD-10-CM | POA: Diagnosis not present

## 2018-02-04 DIAGNOSIS — G4730 Sleep apnea, unspecified: Secondary | ICD-10-CM

## 2018-02-04 DIAGNOSIS — R0602 Shortness of breath: Secondary | ICD-10-CM | POA: Diagnosis not present

## 2018-02-04 DIAGNOSIS — R5383 Other fatigue: Secondary | ICD-10-CM | POA: Diagnosis not present

## 2018-02-04 LAB — TSH: TSH: 1.05 m[IU]/L (ref 0.40–4.50)

## 2018-02-04 MED ORDER — TRIAMCINOLONE ACETONIDE 0.1 % EX CREA: 1.0000 "application " | TOPICAL_CREAM | Freq: Two times a day (BID) | CUTANEOUS | 1 refills | Status: DC

## 2018-02-04 MED ORDER — LOSARTAN POTASSIUM-HCTZ 100-12.5 MG PO TABS: 0.5000 | ORAL_TABLET | Freq: Every day | ORAL | 1 refills | Status: DC

## 2018-02-04 NOTE — Patient Instructions (Addendum)
Your symptoms could be from blockages in your arteries which can lead to a heart attack; I do recommend you to get checked out as soon as possible; call 911 if your symptoms recur or worsen  Coronary Artery Disease, Female Coronary artery disease (CAD) is a condition in which the arteries that lead to the heart (coronary arteries) become narrow or blocked. The narrowing or blockage can lead to decreased blood flow to the heart. Prolonged reduced blood flow can cause a heart attack (myocardial infarction or MI). This condition may also be called coronary heart disease. Because CAD is the leading cause of death in women, it is important to understand what causes this condition and how it is treated. What are the causes? CAD is most often caused by atherosclerosis. This is the buildup of fat and cholesterol (plaque) on the inside of the arteries. Over time, the plaque may narrow or block the artery, reducing blood flow to the heart. Plaque can also become weak and break off within a coronary artery and cause a sudden blockage. Other less common causes of CAD include:  An embolism or blood clot in a coronary artery.  A tearing of the artery (spontaneous coronary artery dissection).  An aneurysm.  Inflammation (vasculitis) in the artery wall.  What increases the risk? The following factors may make you more likely to develop this condition:  Age. Women over age 77 are at a greater risk of CAD.  Family history of CAD.  High blood pressure (hypertension).  Diabetes.  High cholesterol levels.  Tobacco use.  Lack of exercise.  Menopause. ? All postmenopausal women are at greater risk of CAD. ? Women who have experienced menopause between the ages of 73-45 (early menopause) are at a higher risk of CAD. ? Women who have experienced menopause before age 49 (premature menopause) are at a very high risk of CAD.  Excessive alcohol use  A diet high in saturated and trans fats, such as fried  food and processed meat.  Other possible risk factors include:  High stress levels.  Depression  Obesity.  Sleep apnea.  What are the signs or symptoms? Many people do not have any symptoms during the early stages of CAD. As the condition progresses, symptoms may include:  Chest pain (angina). The pain can: ? Feel like crushing or squeezing, or a tightness, pressure, fullness, or heaviness in the chest. ? Last more than a few minutes or can stop and recur. The pain tends to get worse with exercise or stress and to fade with rest.  Pain in the arms, neck, jaw, or back.  Unexplained heartburn or indigestion.  Shortness of breath.  Nausea.  Sudden cold sweats.  Sudden light-headedness.  Fluttering or fast heartbeat (palpitations).  Many women have chest discomfort and the other symptoms. However, women often have unusual (atypical) symptoms, such as:  Fatigue.  Vomiting.  Unexplained feelings of nervousness or anxiety.  Unexplained weakness.  Dizziness or fainting.  How is this diagnosed? This condition is diagnosed based on:  Your family and medical history.  A physical exam.  Tests, including: ? A test to check the electrical signals in your heart (electrocardiogram). ? Exercise stress test. This looks for signs of blockage when the heart is stressed with exercise, such as running on a treadmill. ? Pharmacologic stress test. This test looks for signs of blockage when the heart is being stressed with a medicine. ? Blood tests. ? Coronary angiogram. This is a procedure to look at the  coronary arteries to see if there is any blockage. During this test, a dye is injected into your arteries so they appear on an X-ray. ? A test that uses sound waves to take a picture of your heart (echocardiogram). ? Chest X-ray.  How is this treated? This condition may be treated by:  Healthy lifestyle changes to reduce risk factors.  Medicines such as: ? Antiplatelet  medicines and blood-thinning medicines, such as aspirin. These help prevent blood clots. ? Nitroglycerin. ? Blood pressure medicines. ? Cholesterol-lowering medicine.  Coronary angioplasty and stenting. During this procedure, a thin, flexible tube is inserted through a blood vessel and into a blocked artery. A balloon or similar device on the end of the tube is inflated to open up the artery. In some cases, a small, mesh tube (stent) is inserted into the artery to keep it open.  Coronary artery bypass surgery. During this surgery, veins or arteries from other parts of the body are used to create a bypass around the blockage and allow blood to reach your heart.  Follow these instructions at home: Medicines  Take over-the-counter and prescription medicines only as told by your health care provider.  Do not take the following medicines unless your health care provider approves: ? NSAIDs, such as ibuprofen, naproxen, or celecoxib. ? Vitamin supplements that contain vitamin A, vitamin E, or both. ? Hormone replacement therapy that contains estrogen with or without progestin. Lifestyle  Follow an exercise program approved by your health care provider. Aim for 150 minutes of moderate exercise or 75 minutes of vigorous exercise each week.  Maintain a healthy weight or lose weight as approved by your health care provider.  Rest when you are tired.  Learn to manage stress or try to limit your stress. Ask your health care provider for suggestions if you need help.  Get screened for depression and seek treatment, if needed.  Do not use any products that contain nicotine or tobacco, such as cigarettes and e-cigarettes. If you need help quitting, ask your health care provider.  Do not use illegal drugs. Eating and drinking  Follow a heart-healthy diet. A dietitian can help educate you about healthy food options and changes. In general, eat plenty of fruits and vegetables, lean meats, and whole  grains.  Avoid foods high in: ? Sugar. ? Salt (sodium). ? Saturated fats, such as processed or fatty meat. ? Trans fats, such as fried food.  Use healthy cooking methods such as roasting, grilling, broiling, baking, poaching, steaming, or stir-frying.  If you drink alcohol, and your health care provider approves, limit your alcohol intake to no more than 1 drink per day. One drink equals 12 ounces of beer, 5 ounces of wine, or 1 ounces of hard liquor. General instructions  Manage any other health conditions, such as hypertension and diabetes. These conditions affect your heart.  Your health care provider may ask you to monitor your blood pressure. Ideally, your blood pressure should be below 130/80.  Keep all follow-up visits as told by your health care provider. This is important. Get help right away if:  You have pain in your chest, neck, arm, jaw, stomach, or back that: ? Lasts more than a few minutes. ? Is recurring. ? Is not relieved by taking medicine under your tongue (sublingualnitroglycerin).  You have profuse sweating without cause.  You have unexplained: ? Heartburn or indigestion. ? Shortness of breath or difficulty breathing. ? Fluttering or fast heartbeat (palpitations). ? Nausea or vomiting. ? Fatigue. ?  Feelings of nervousness or anxiety. ? Weakness. ? Diarrhea.  You have sudden light-headedness or dizziness.  You faint.  You feel like hurting yourself or think about taking your own life. These symptoms may represent a serious problem that is an emergency. Do not wait to see if the symptoms will go away. Get medical help right away. Call your local emergency services (911 in the [place] Do not drive yourself to the hospital. Summary  Coronary artery disease (CAD) is a process in which the arteries that lead to the heart (coronary arteries) become narrow or blocked. The narrowing or blockage can lead to a heart attack.  Many women have chest discomfort  and other common symptoms of CAD. However, women often have different (atypical) symptoms, such as fatigue, vomiting, and dizziness or weakness.  CAD can be treated with lifestyle changes, medicines, surgery, or a combination of these treatments. This information is not intended to replace advice given to you by your health care provider. Make sure you discuss any questions you have with your health care provider. Document Released: 02/15/2012 Document Revised: 11/13/2016 Document Reviewed: 11/13/2016 Elsevier Interactive Patient Education  [employer].

## 2018-02-04 NOTE — Progress Notes (Signed)
BP (!) 145/81 (BP Location: Left Wrist)   Pulse 79   Temp (!) 97.4 F (36.3 C) (Oral)   Resp 14   Wt 207 lb 14.4 oz (94.3 kg)   SpO2 96%   BMI 30.70 kg/m   Today's Vitals   02/04/18 1335 02/04/18 1431  BP:  (!) 145/81  Pulse: 79   Resp: 14   Temp: (!) 97.4 F (36.3 C)   TempSrc: Oral   SpO2: 96%   Weight: 207 lb 14.4 oz (94.3 kg)     Subjective:    Patient ID: Ruth Bishop, female    DOB: Apr 18, 1946, 72 y.o.   MRN: [id number]  HPI: Ruth Bishop is a 72 y.o. female  Chief Complaint  Patient presents with  . Fatigue    weakness onset 1 month  . Chest Pain  . Medication Refill  . Hypertension    started bp med back due to it being high    HPI She is here for an acute visit; had a cold after visiting [place]; stayed in bed and used a heating pad; went on a trip for 3 days, Jan-Feb and has not been right since; [place]; no rash or fever; not right since then Her daughter asked if she is depressed, and she does not think so; lives with 89 yo grandson but nothing out of the ordinary; one floor living; just tired; really stiff when she wakes up; gets better after being up Stiffness in the joints, back, wrists, elsewhere No suspicion for blood clot; no redness or swelling in the leg Still playing tennis but having to push herself to do it; so tired The chest pain is like something sitting on her chest, like something sitting on the chest; does not last long; jright after the cold; just tired; can't get up and do her housework; walks from bedroom to kitchen and feels tired; played four games of tennis yesterday and had to stop; no sensation of chest pressure when she plays tennis; still has hot flashes; no nausea or vomiting, no dizziness Taking vitamins Not sure about family hx in terms of RA, but there is heart trouble; father and mother and brother and sister died from bad hearts Does feel a little pressure right now She snores and she might have sleep apnea;  stopped breathing witnessed by friend Currently having a 3 out of 10 pressure in the chest; not feeling it at all right now; no chest pressure right now; when she had it 3 out of 10; last episode was yesterday, more fatigue and SHOB but no pressure; sits down and rests for 5 minutes and then feels better  Depression screen [facility] 2/9 02/04/2018 08/04/2017 01/29/2017 01/13/2017 11/11/2016  Decreased Interest 0 0 0 0 0  Down, Depressed, Hopeless 0 0 0 0 0  PHQ - 2 Score 0 0 0 0 0    Relevant past medical, surgical, family and social history reviewed Past Medical History:  Diagnosis Date  . Arthritis   . Diastolic dysfunction 03/15/8118  . GERD (gastroesophageal reflux disease)   . Heart murmur   . Hypertension   . Incidental lung nodule, > 27mm and < 21mm 08/04/2017   4 mm ground glass nodule LLL incidentally noted on CT scan Nov 2016   Past Surgical History:  Procedure Laterality Date  . ABDOMINAL HYSTERECTOMY    . COLONOSCOPY WITH PROPOFOL N/A 10/01/2015   Procedure: COLONOSCOPY WITH PROPOFOL;  Surgeon: Lollie Sails, MD;  Location: [facility] ENDOSCOPY;  Service:  Endoscopy;  Laterality: N/A;  . ESOPHAGOGASTRODUODENOSCOPY (EGD) WITH PROPOFOL N/A 10/01/2015   Procedure: ESOPHAGOGASTRODUODENOSCOPY (EGD) WITH PROPOFOL;  Surgeon: Lollie Sails, MD;  Location: [facility] ENDOSCOPY;  Service: Endoscopy;  Laterality: N/A;   Family History  Problem Relation Age of Onset  . Heart disease Mother   . Diabetes Mother   . Aneurysm Mother   . Heart disease Father   . Heart disease Sister   . Diabetes Sister   . Pneumonia Sister   . Heart disease Brother   . Diabetes Brother   . Diabetes Sister   . Heart disease Sister   . Breast cancer Neg Hx    Social History   Tobacco Use  . Smoking status: Never Smoker  . Smokeless tobacco: Never Used  Substance Use Topics  . Alcohol use: Yes    Alcohol/week: 0.0 oz    Comment: socially  . Drug use: No    Interim medical history since last visit  reviewed. Allergies and medications reviewed  Review of Systems Per HPI unless specifically indicated above     Objective:    BP (!) 145/81 (BP Location: Left Wrist)   Pulse 79   Temp (!) 97.4 F (36.3 C) (Oral)   Resp 14   Wt 207 lb 14.4 oz (94.3 kg)   SpO2 96%   BMI 30.70 kg/m   Wt Readings from Last 3 Encounters:  02/04/18 207 lb 14.4 oz (94.3 kg)  08/04/17 207 lb 1.6 oz (93.9 kg)  01/29/17 203 lb (92.1 kg)    Physical Exam  Constitutional: She appears well-developed and well-nourished. No distress.  HENT:  Head: Normocephalic and atraumatic.  Eyes: EOM are normal. No scleral icterus.  Neck: No thyromegaly present.  Cardiovascular: Normal rate, regular rhythm and normal heart sounds.  No murmur heard. Pulmonary/Chest: Effort normal and breath sounds normal. No respiratory distress. She has no wheezes.  Abdominal: Soft. Bowel sounds are normal. She exhibits no distension.  Musculoskeletal: Normal range of motion. She exhibits no edema.  Neurological: She is alert. She exhibits normal muscle tone.  Skin: Skin is warm and dry. She is not diaphoretic. No pallor.  Psychiatric: She has a normal mood and affect. Her behavior is normal. Judgment and thought content normal.      Assessment & Plan:   Problem List Items Addressed This Visit      Other   Fatigue   Relevant Orders   Ambulatory referral to Cardiology   ECHOCARDIOGRAM COMPLETE   TSH (Completed)    Other Visit Diagnoses    Chest pressure    -  Primary   discussed dx; she does not want to go to the ER; willr efer to cardiology; order echo; labs to include trop I and D-dimer; reasons to seek care, call 911 discus   Relevant Orders   EKG 12-Lead (Completed)   Ambulatory referral to Cardiology   CBC with Differential/Platelet (Completed)   D-Dimer, Quantitative (Completed)   COMPLETE METABOLIC PANEL WITH GFR (Completed)   Troponin I (Completed)   ECHOCARDIOGRAM COMPLETE   Shortness of breath        echocardiogram; ddx includes anginal equivalent, PE, etc; labs and imaging; patient declines going to ER   Relevant Orders   Ambulatory referral to Cardiology   CBC with Differential/Platelet (Completed)   D-Dimer, Quantitative (Completed)   COMPLETE METABOLIC PANEL WITH GFR (Completed)   Troponin I (Completed)   ECHOCARDIOGRAM COMPLETE   Stiffness in joint       check labs,  r/o autoimmune d/o   Relevant Orders   ANA,IFA RA Diag Pnl w/rflx Tit/Patn   C-reactive protein (Completed)   Sleep apnea, unspecified type       refer for sleep study       Follow up plan: Return in about 10 days (around 02/14/2018) for follow-up visit with Dr. Sanda Klein.  An after-visit summary was printed and given to the patient at [facility].  Please see the patient instructions which may contain other information and recommendations beyond what is mentioned above in the assessment and plan.  Meds ordered this encounter  Medications  . losartan-hydrochlorothiazide (HYZAAR) 100-12.5 MG tablet    Sig: Take 0.5 tablets by mouth daily.    Dispense:  45 tablet    Refill:  1  . triamcinolone cream (KENALOG) 0.1 %    Sig: Apply 1 application topically 2 (two) times daily. If needed    Dispense:  45 g    Refill:  1    Orders Placed This Encounter  Procedures  . CBC with Differential/Platelet  . D-Dimer, Quantitative  . ANA,IFA RA Diag Pnl w/rflx Tit/Patn  . C-reactive protein  . COMPLETE METABOLIC PANEL WITH GFR  . Troponin I  . TSH  . Anti-nuclear ab-titer (ANA titer)  . Ambulatory referral to Cardiology  . EKG 12-Lead  . ECHOCARDIOGRAM COMPLETE

## 2018-02-07 ENCOUNTER — Telehealth: Payer: Self-pay | Admitting: Family Medicine

## 2018-02-07 ENCOUNTER — Ambulatory Visit
Admission: RE | Admit: 2018-02-07 | Discharge: 2018-02-07 | Disposition: A | Discharge status: Home / Self Care | Payer: Medicare HMO | Source: Ambulatory Visit | Attending: Family Medicine | Admitting: Family Medicine

## 2018-02-07 ENCOUNTER — Other Ambulatory Visit: Payer: Self-pay

## 2018-02-07 ENCOUNTER — Other Ambulatory Visit: Payer: Self-pay | Admitting: Family Medicine

## 2018-02-07 DIAGNOSIS — R918 Other nonspecific abnormal finding of lung field: Secondary | ICD-10-CM | POA: Insufficient documentation

## 2018-02-07 DIAGNOSIS — R221 Localized swelling, mass and lump, neck: Secondary | ICD-10-CM | POA: Diagnosis not present

## 2018-02-07 DIAGNOSIS — R7989 Other specified abnormal findings of blood chemistry: Secondary | ICD-10-CM | POA: Diagnosis not present

## 2018-02-07 DIAGNOSIS — R791 Abnormal coagulation profile: Secondary | ICD-10-CM | POA: Diagnosis not present

## 2018-02-07 MED ORDER — IOPAMIDOL (ISOVUE-370) INJECTION 76%
75.0000 mL | Freq: Once | INTRAVENOUS | Status: AC | PRN
  Administered 2018-02-07: 75 mL via INTRAVENOUS

## 2018-02-07 NOTE — Assessment & Plan Note (Signed)
Chest CT; patient refused ER eval over the weekend

## 2018-02-07 NOTE — Telephone Encounter (Signed)
Chest CT for PE r/o

## 2018-02-08 ENCOUNTER — Other Ambulatory Visit: Payer: Self-pay | Admitting: Family Medicine

## 2018-02-08 DIAGNOSIS — R911 Solitary pulmonary nodule: Secondary | ICD-10-CM

## 2018-02-08 DIAGNOSIS — R5383 Other fatigue: Secondary | ICD-10-CM | POA: Insufficient documentation

## 2018-02-08 NOTE — Assessment & Plan Note (Signed)
Refer for sleep study

## 2018-02-09 ENCOUNTER — Telehealth: Payer: Self-pay | Admitting: Family Medicine

## 2018-02-09 NOTE — Telephone Encounter (Signed)
Copied from [place] [phone]. Topic: General - Other >> Feb 09, 2018 12:07 PM Neva Seat wrote: Pt returned missed call.  Please call pt back at home number and leave a message.
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
## 2018-02-14 ENCOUNTER — Encounter: Payer: Self-pay | Admitting: Cardiovascular Disease

## 2018-02-14 ENCOUNTER — Ambulatory Visit (INDEPENDENT_AMBULATORY_CARE_PROVIDER_SITE_OTHER): Payer: Medicare HMO | Admitting: Cardiovascular Disease

## 2018-02-14 ENCOUNTER — Other Ambulatory Visit: Payer: Self-pay

## 2018-02-14 ENCOUNTER — Ambulatory Visit: Payer: Medicare HMO | Admitting: Family Medicine

## 2018-02-14 VITALS — BP 140/70 | HR 69 | Ht 69.0 in | Wt 209.8 lb

## 2018-02-14 DIAGNOSIS — G8929 Other chronic pain: Secondary | ICD-10-CM

## 2018-02-14 DIAGNOSIS — R768 Other specified abnormal immunological findings in serum: Secondary | ICD-10-CM

## 2018-02-14 DIAGNOSIS — R7982 Elevated C-reactive protein (CRP): Secondary | ICD-10-CM | POA: Diagnosis not present

## 2018-02-14 DIAGNOSIS — R0789 Other chest pain: Secondary | ICD-10-CM | POA: Diagnosis not present

## 2018-02-14 DIAGNOSIS — I10 Essential (primary) hypertension: Secondary | ICD-10-CM

## 2018-02-14 DIAGNOSIS — R5383 Other fatigue: Secondary | ICD-10-CM

## 2018-02-14 NOTE — Patient Instructions (Signed)

## 2018-02-22 ENCOUNTER — Ambulatory Visit: Payer: Medicare HMO | Admitting: Family Medicine

## 2018-03-02 DIAGNOSIS — R7982 Elevated C-reactive protein (CRP): Secondary | ICD-10-CM | POA: Diagnosis not present

## 2018-03-02 DIAGNOSIS — R9389 Abnormal findings on diagnostic imaging of other specified body structures: Secondary | ICD-10-CM | POA: Diagnosis not present

## 2018-03-02 DIAGNOSIS — R768 Other specified abnormal immunological findings in serum: Secondary | ICD-10-CM | POA: Diagnosis not present

## 2018-03-02 DIAGNOSIS — R079 Chest pain, unspecified: Secondary | ICD-10-CM | POA: Diagnosis not present

## 2018-03-10 ENCOUNTER — Ambulatory Visit: Payer: Medicare HMO | Admitting: Family Medicine

## 2018-03-10 DIAGNOSIS — R768 Other specified abnormal immunological findings in serum: Secondary | ICD-10-CM | POA: Diagnosis not present

## 2018-03-10 DIAGNOSIS — R252 Cramp and spasm: Secondary | ICD-10-CM | POA: Diagnosis not present

## 2018-03-10 DIAGNOSIS — R079 Chest pain, unspecified: Secondary | ICD-10-CM | POA: Diagnosis not present

## 2018-03-10 DIAGNOSIS — M940 Chondrocostal junction syndrome [Tietze]: Secondary | ICD-10-CM | POA: Diagnosis not present

## 2018-03-11 ENCOUNTER — Telehealth: Payer: Self-pay | Admitting: Family Medicine

## 2018-03-11 LAB — ANTI-NUCLEAR AB-TITER (ANA TITER): ANA Titer 1: 1:160 {titer} — ABNORMAL HIGH

## 2018-03-11 LAB — COMPLETE METABOLIC PANEL WITH GFR
AG Ratio: 1.3 (calc) (ref 1.0–2.5)
ALBUMIN MSPROF: 4.1 g/dL (ref 3.6–5.1)
ALT: 13 U/L (ref 6–29)
AST: 17 U/L (ref 10–35)
Alkaline phosphatase (APISO): 112 U/L (ref 33–130)
BILIRUBIN TOTAL: 0.4 mg/dL (ref 0.2–1.2)
BUN: 17 mg/dL (ref 7–25)
CALCIUM: 9.7 mg/dL (ref 8.6–10.4)
CHLORIDE: 105 mmol/L (ref 98–110)
CO2: 30 mmol/L (ref 20–32)
Creat: 0.93 mg/dL (ref 0.50–0.99)
GFR, EST AFRICAN AMERICAN: 73 mL/min/{1.73_m2} (ref >=60)
GFR, EST NON AFRICAN AMERICAN: 63 mL/min/{1.73_m2} (ref >=60)
GLUCOSE: 94 mg/dL (ref 65–99)
Globulin: 3.1 g/dL (calc) (ref 1.9–3.7)
Potassium: 4.3 mmol/L (ref 3.5–5.3)
Sodium: 142 mmol/L (ref 135–146)
TOTAL PROTEIN: 7.2 g/dL (ref 6.1–8.1)

## 2018-03-11 LAB — CBC WITH DIFFERENTIAL/PLATELET
BASOS ABS: 38 {cells}/uL (ref 0–200)
Basophils Relative: 0.6 %
EOS ABS: 164 {cells}/uL (ref 15–500)
Eosinophils Relative: 2.6 %
HEMATOCRIT: 41.7 % (ref 35.0–45.0)
HEMOGLOBIN: 14.1 g/dL (ref 11.7–15.5)
LYMPHS ABS: 2993 {cells}/uL (ref 850–3900)
MCH: 26.7 pg — AB (ref 27.0–33.0)
MCHC: 33.8 g/dL (ref 32.0–36.0)
MCV: 79 fL — ABNORMAL LOW (ref 80.0–100.0)
MPV: 10.5 fL (ref 7.5–12.5)
Monocytes Relative: 6.2 %
NEUTROS ABS: 2715 {cells}/uL (ref 1500–7800)
Neutrophils Relative %: 43.1 %
Platelets: 278 10*3/uL (ref 140–400)
RBC: 5.28 10*6/uL — ABNORMAL HIGH (ref 3.80–5.10)
RDW: 14.1 % (ref 11.0–15.0)
Total Lymphocyte: 47.5 %
WBC: 6.3 10*3/uL (ref 3.8–10.8)
WBCMIX: 391 {cells}/uL (ref 200–950)

## 2018-03-11 LAB — C-REACTIVE PROTEIN: CRP: 13 mg/L — ABNORMAL HIGH (ref <8.0)

## 2018-03-11 LAB — ANA RA PNL1 W/REFL
ANA: POSITIVE — AB
Cyclic Citrullin Peptide Ab: <16 UNITS
Rhuematoid fact SerPl-aCnc: <14 IU/mL (ref <14)

## 2018-03-11 LAB — TROPONIN I

## 2018-03-11 LAB — ANA,IFA RA DIAG PNL W/RFLX TIT/PATN: Anti Nuclear Antibody(ANA): POSITIVE — AB

## 2018-03-11 LAB — D-DIMER, QUANTITATIVE: D-Dimer, Quant: 0.51 mcg/mL FEU — ABNORMAL HIGH (ref <0.50)

## 2018-03-11 NOTE — Telephone Encounter (Signed)
Miel, labs just appeared on my desktop that were done over a month ago and it appears that I already responded to them and talked to the patient about them.  I ordered an echocardiogram on 02/04/2018 and haven't see those results yet, though the order says "complete" Can you please look into this and see if we can get those echo results and what happened with the labs? Thank you

## 2018-03-14 ENCOUNTER — Other Ambulatory Visit: Payer: Self-pay

## 2018-03-14 DIAGNOSIS — R0602 Shortness of breath: Secondary | ICD-10-CM

## 2018-03-14 DIAGNOSIS — R5383 Other fatigue: Secondary | ICD-10-CM

## 2018-03-14 DIAGNOSIS — R0789 Other chest pain: Secondary | ICD-10-CM

## 2018-03-14 NOTE — Telephone Encounter (Signed)
Thank you for looking into the labs Can you please give her the number then for her to call and get the echo scheduled? Thank you

## 2018-03-14 NOTE — Telephone Encounter (Signed)
Thank you :)

## 2018-03-14 NOTE — Telephone Encounter (Signed)
Patient stated that she is not wanting to do the echocardiogram because she had a thorough evaluation with Dr. Rockey Situ on 02/14/18 and he informed her that her heart was fine.  She said she appreciates Dr. Sanda Klein for trying to be thorough but she will not be paying to have any additional testing done.

## 2018-03-14 NOTE — Telephone Encounter (Signed)
I have an IT ticket to see why the labs were resulted to you again.  The echo is not in a complete status.  The order was put in as Echocardiogram Complete. Patient has not had the procedure done as of yet.

## 2018-04-07 ENCOUNTER — Telehealth: Payer: Self-pay

## 2018-04-07 NOTE — Telephone Encounter (Signed)
Pt scheduled for awv 08/05/18

## 2018-04-12 ENCOUNTER — Ambulatory Visit (INDEPENDENT_AMBULATORY_CARE_PROVIDER_SITE_OTHER): Payer: Medicare HMO | Admitting: Nurse Practitioner

## 2018-04-12 ENCOUNTER — Encounter: Payer: Self-pay | Admitting: Nurse Practitioner

## 2018-04-12 VITALS — BP 130/76 | HR 78 | Temp 97.9°F | Resp 16 | Ht 69.0 in | Wt 213.4 lb

## 2018-04-12 DIAGNOSIS — N898 Other specified noninflammatory disorders of vagina: Secondary | ICD-10-CM | POA: Diagnosis not present

## 2018-04-12 DIAGNOSIS — R300 Dysuria: Secondary | ICD-10-CM | POA: Diagnosis not present

## 2018-04-12 DIAGNOSIS — R7982 Elevated C-reactive protein (CRP): Secondary | ICD-10-CM | POA: Diagnosis not present

## 2018-04-12 DIAGNOSIS — R768 Other specified abnormal immunological findings in serum: Secondary | ICD-10-CM | POA: Diagnosis not present

## 2018-04-12 DIAGNOSIS — R350 Frequency of micturition: Secondary | ICD-10-CM | POA: Diagnosis not present

## 2018-04-12 LAB — POCT URINALYSIS DIPSTICK
Bilirubin, UA: NEGATIVE
Blood, UA: NEGATIVE
Glucose, UA: NEGATIVE
KETONES UA: NEGATIVE
NITRITE UA: NEGATIVE
Spec Grav, UA: 1.02 (ref 1.010–1.025)
Urobilinogen, UA: NEGATIVE E.U./dL — AB
pH, UA: 5 (ref 5.0–8.0)

## 2018-04-12 MED ORDER — FLUCONAZOLE 150 MG PO TABS: 150.0000 mg | ORAL_TABLET | Freq: Once | ORAL | 0 refills | Status: AC

## 2018-04-12 NOTE — Progress Notes (Addendum)
efName: Ruth Bishop   MRN: [id number]    DOB: January 23, 1946   Date:04/12/2018       Progress Note  Subjective  Chief Complaint  Chief Complaint  Patient presents with  . Urinary Frequency    HPI  Patient endorses vaginal itching for the past 2 week Patient endorses dysuria- last week states doesn't burn anymore, urinary frequency-states drinks a lot of water Denies suprapubic pain, and hematuria, fevers, chills, flank pain, CVA tenderness, n/v, fatigue. Denies vaginal discharge or pain.  Patient has tried monistat, triamcinolone, and terconazole intermittently for the past couple weeks. Patient has not had any new sexual partners.    Patient Active Problem List   Diagnosis Date Noted  . Elevated C-reactive protein (CRP) 02/14/2018  . Elevated antinuclear antibody (ANA) level 02/14/2018  . Chest pressure 02/14/2018  . Fatigue 02/08/2018  . Positive D-dimer 02/07/2018  . First degree AV block 09/17/2017  . Left atrial enlargement 09/17/2017  . Incidental lung nodule, > 50mm and < 70mm 08/04/2017  . Encounter for preventive care 08/04/2017  . Otalgia, left ear 01/29/2017  . Prediabetes 11/11/2016  . Medication monitoring encounter 11/11/2016  . Aortic regurgitation 06/17/2016  . Mitral regurgitation 06/17/2016  . Diastolic dysfunction 33/29/5188  . Hormone replacement therapy 05/26/2016  . Breast cancer screening 04/21/2016  . Elevated alkaline phosphatase level 06/13/2015  . Osteoarthritis of both knees 06/13/2015  . Encounter for general adult medical examination without abnormal findings 05/24/2015  . 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 Back ache 05/24/2015  . Chronic pain 05/24/2015  . Chronic constipation 05/24/2015  . 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 Cardiac murmur 05/24/2015  . Blood in the urine 05/24/2015  . Hypertension goal BP (blood pressure) < 150/90 05/24/2015  . 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   . Post menopausal syndrome 05/24/2015  . Screening cholesterol level 05/24/2015  . Skin-picking disorder 05/24/2015  . Routine general medical examination at a health care facility 05/24/2015    Past Medical History:  Diagnosis Date  . Arthritis   . Diastolic dysfunction 03/22/6062  . GERD (gastroesophageal reflux disease)   . Heart murmur   . Hypertension   . Incidental lung nodule, > 21mm and < 49mm 08/04/2017   4 mm ground glass nodule LLL incidentally noted on CT scan Nov 2016    Past Surgical History:  Procedure Laterality Date  . ABDOMINAL HYSTERECTOMY    . COLONOSCOPY WITH PROPOFOL N/A 10/01/2015   Procedure: COLONOSCOPY WITH PROPOFOL;  Surgeon: Lollie Sails, MD;  Location: [facility] ENDOSCOPY;  Service: Endoscopy;  Laterality: N/A;  . ESOPHAGOGASTRODUODENOSCOPY (EGD) WITH PROPOFOL N/A 10/01/2015   Procedure: ESOPHAGOGASTRODUODENOSCOPY (EGD) WITH PROPOFOL;  Surgeon: Lollie Sails, MD;  Location[facility] ENDOSCOPY;  Service: Endoscopy;  Laterality: N/A;    Social History   Tobacco Use  . Smoking status: Never Smoker  . Smokeless tobacco: Never Used  Substance Use Topics  . Alcohol use: Yes    Alcohol/week: 0.0 oz    Comment: socially     Current Outpatient Medications:  .  aspirin EC 81 MG tablet, Take 81 mg by mouth daily., Disp: , Rfl:  .  Calcium Carbonate-Vitamin D (CALTRATE 600+D) 600-400 MG-UNIT per tablet, Take by mouth., Disp: , Rfl:  .  docusate sodium (COLACE) 250 MG capsule, Take 250 mg by mouth daily., Disp: , Rfl:  .  Hyoscyamine Sulfate 0.375 MG TBCR, Take 1 tablet (0.375 mg total) by mouth 2 (two) times daily as needed., Disp:  100 each, Rfl: 1 .  losartan-hydrochlorothiazide (HYZAAR) 100-12.5 MG tablet, Take 0.5 tablets by mouth daily., Disp: 45 tablet, Rfl: 1 .  MULTIPLE VITAMIN PO, Take by mouth., Disp: , Rfl:  .  Probiotic Product (PROBIOTIC ADVANCED PO), Take by mouth., Disp: , Rfl:  .  triamcinolone cream (KENALOG) 0.1 %, Apply 1 application  topically 2 (two) times daily. If needed, Disp: 45 g, Rfl: 1 .  TURMERIC PO, Take by mouth., Disp: , Rfl:  .  fluconazole (DIFLUCAN) 150 MG tablet, Take 1 tablet (150 mg total) by mouth once for 1 dose., Disp: 1 tablet, Rfl: 0  Allergies  Allergen Reactions  . Nitrofurantoin Monohyd Macro     ROS  Constitutional: Negative for fever or weight change.  Respiratory: Negative for cough and shortness of breath.   Cardiovascular: Negative for chest pain or palpitations.  Gastrointestinal: Negative for abdominal pain, no bowel changes.  Musculoskeletal: Negative for gait problem or joint swelling.  Skin: Negative for rash.  Neurological: Negative for dizziness or headache.  No other specific complaints in a complete review of systems (except as listed in HPI above).  Objective  Vitals:   04/12/18 0959  BP: 130/76  Pulse: 78  Resp: 16  Temp: 97.9 F (36.6 C)  TempSrc: Oral  SpO2: 97%  Weight: 213 lb 6.4 oz (96.8 kg)  Height: 5\' 9"  (1.753 m)    Body mass index is 31.51 kg/m.  Nursing Note and Vital Signs reviewed.  Physical Exam  Constitutional: Patient appears well-developed and well-nourished. Obese, No distress.  Cardiovascular: Normal rate, skin warm and dry  Pulmonary/Chest: Effort normal  Abdominal: Soft and non-tender, bowel sounds present, no cva tenderness GYN: external genitalia discoloration consistent with vitiligo, sts normal for patient, some clear discharge notes, no redness, excoriation marks or foul odor.  Psychiatric: Patient has a normal mood and affect. behavior is normal. Judgment and thought content normal.  Results for orders placed or performed in visit on 04/12/18 (from the past 72 hour(s))  POCT urinalysis dipstick     Status: Abnormal   Collection Time: 04/12/18 10:05 AM  Result Value Ref Range   Color, UA yellow    Clarity, UA clear    Glucose, UA negative    Bilirubin, UA negative    Ketones, UA negative    Spec Grav, UA 1.020 1.010 -  1.025   Blood, UA negative    pH, UA 5.0 5.0 - 8.0   Protein, UA trace    Urobilinogen, UA negative (A) 0.2 or 1.0 E.U./dL   Nitrite, UA negative    Leukocytes, UA Trace (A) Negative   Appearance clear    Odor none     Assessment & Plan 1. Urinary frequency - POCT urinalysis dipstick - Urine Culture  2. Dysuria - POCT urinalysis dipstick - Urine Culture  3. Vaginal itching - WET PREP BY MOLECULAR PROBE - fluconazole (DIFLUCAN) 150 MG tablet; Take 1 tablet (150 mg total) by mouth once for 1 dose.  Dispense: 1 tablet; Refill: 0   4. Elevated C-reactive protein (CRP) Pt requesting referral to new rheum provider- dr. Kathlene November  - Ambulatory referral to Rheumatology  5. Elevated antinuclear antibody (ANA) level  - Ambulatory referral to Rheumatology   If negative culture and wet prep and patient still having symptoms will send to GYN   -Red flags and when to present for emergency care or RTC including fever >101.48F, chest pain, shortness of breath, new/worsening/un-resolving symptoms, reviewed with patient at time of  visit. Follow up and care instructions discussed and provided in AVS. ---------------------------------- I have reviewed this encounter including the documentation in this note and/or discussed this patient with the provider, Suezanne Cheshire DNP AGNP-C. I am certifying that I agree with the content of this note as supervising physician. Enid Derry, [facility] 04/29/2018, 12:28 PM

## 2018-04-12 NOTE — Patient Instructions (Addendum)
- Drink at least 8 glasses of water - take diflucan sent to pharmacy and avoid alcohol for 3 days -we are waiting for urine culture and wet prep results and will further treat if necessary - if you are still having symptoms in a week we will refer to OBGYN   Vaginal Yeast infection, Adult Vaginal yeast infection is a condition that causes soreness, swelling, and redness (inflammation) of the vagina. It also causes vaginal discharge. This is a common condition. Some women get this infection frequently. What are the causes? This condition is caused by a change in the normal balance of the yeast (candida) and bacteria that live in the vagina. This change causes an overgrowth of yeast, which causes the inflammation. What increases the risk? This condition is more likely to develop in:  Women who take antibiotic medicines.  Women who have diabetes.  Women who take birth control pills.  Women who are pregnant.  Women who douche often.  Women who have a weak defense (immune) system.  Women who have been taking steroid medicines for a long time.  Women who frequently wear tight clothing.  What are the signs or symptoms? Symptoms of this condition include:  White, thick vaginal discharge.  Swelling, itching, redness, and irritation of the vagina. The lips of the vagina (vulva) may be affected as well.  Pain or a burning feeling while urinating.  Pain during sex.  How is this diagnosed? This condition is diagnosed with a medical history and physical exam. This will include a pelvic exam. Your health care provider will examine a sample of your vaginal discharge under a microscope. Your health care provider may send this sample for testing to confirm the diagnosis. How is this treated? This condition is treated with medicine. Medicines may be over-the-counter or prescription. You may be told to use one or more of the following:  Medicine that is taken orally.  Medicine that is  applied as a cream.  Medicine that is inserted directly into the vagina (suppository).  Follow these instructions at home:  Take or apply over-the-counter and prescription medicines only as told by your health care provider.  Do not have sex until your health care provider has approved. Tell your sex partner that you have a yeast infection. That person should go to his or her health care provider if he or she develops symptoms.  Do not wear tight clothes, such as pantyhose or tight pants.  Avoid using tampons until your health care provider approves.  Eat more yogurt. This may help to keep your yeast infection from returning.  Try taking a sitz bath to help with discomfort. This is a warm water bath that is taken while you are sitting down. The water should only come up to your hips and should cover your buttocks. Do this 3-4 times per day or as told by your health care provider.  Do not douche.  Wear breathable, cotton underwear.  If you have diabetes, keep your blood sugar levels under control. Contact a health care provider if:  You have a fever.  Your symptoms go away and then return.  Your symptoms do not get better with treatment.  Your symptoms get worse.  You have new symptoms.  You develop blisters in or around your vagina.  You have blood coming from your vagina and it is not your menstrual period.  You develop pain in your abdomen. This information is not intended to replace advice given to you by your health  care provider. Make sure you discuss any questions you have with your health care provider. Document Released: 09/02/2005 Document Revised: 05/06/2016 Document Reviewed: 05/27/2015 Elsevier Interactive Patient Education  2018 [employer].

## 2018-04-13 LAB — URINE CULTURE
MICRO NUMBER:: 90555365
SPECIMEN QUALITY:: ADEQUATE

## 2018-04-13 LAB — WET PREP BY MOLECULAR PROBE
Candida species: NOT DETECTED
Gardnerella vaginalis: NOT DETECTED
MICRO NUMBER: 90558520
SOURCE:: 0
SPECIMEN QUALITY: ADEQUATE
Trichomonas vaginosis: NOT DETECTED

## 2018-04-26 DIAGNOSIS — M1990 Unspecified osteoarthritis, unspecified site: Secondary | ICD-10-CM | POA: Diagnosis not present

## 2018-04-26 DIAGNOSIS — M2550 Pain in unspecified joint: Secondary | ICD-10-CM | POA: Diagnosis not present

## 2018-04-26 DIAGNOSIS — R079 Chest pain, unspecified: Secondary | ICD-10-CM | POA: Diagnosis not present

## 2018-04-26 DIAGNOSIS — M549 Dorsalgia, unspecified: Secondary | ICD-10-CM | POA: Diagnosis not present

## 2018-04-26 DIAGNOSIS — R768 Other specified abnormal immunological findings in serum: Secondary | ICD-10-CM | POA: Diagnosis not present

## 2018-04-26 DIAGNOSIS — M940 Chondrocostal junction syndrome [Tietze]: Secondary | ICD-10-CM | POA: Diagnosis not present

## 2018-08-05 ENCOUNTER — Ambulatory Visit (INDEPENDENT_AMBULATORY_CARE_PROVIDER_SITE_OTHER): Payer: Medicare HMO

## 2018-08-05 VITALS — BP 124/60 | HR 70 | Temp 98.2°F | Resp 14 | Ht 69.0 in | Wt 209.9 lb

## 2018-08-05 DIAGNOSIS — Z1239 Encounter for other screening for malignant neoplasm of breast: Secondary | ICD-10-CM

## 2018-08-05 DIAGNOSIS — Z0000 Encounter for general adult medical examination without abnormal findings: Secondary | ICD-10-CM | POA: Diagnosis not present

## 2018-08-05 DIAGNOSIS — E2839 Other primary ovarian failure: Secondary | ICD-10-CM

## 2018-08-05 DIAGNOSIS — Z23 Encounter for immunization: Secondary | ICD-10-CM | POA: Diagnosis not present

## 2018-08-05 DIAGNOSIS — Z1231 Encounter for screening mammogram for malignant neoplasm of breast: Secondary | ICD-10-CM | POA: Diagnosis not present

## 2018-08-05 NOTE — Progress Notes (Signed)
Subjective:   Victorian Gunn is a 72 y.o. female who presents for Medicare Annual (Subsequent) preventive examination.  Review of Systems:  N/A Cardiac Risk Factors include: advanced age (>22men, >44 women);hypertension;sedentary lifestyle;obesity (BMI >30kg/m2)     Objective:     Vitals: BP 124/60 (BP Location: Left Arm, Patient Position: Sitting, Cuff Size: Large)   Pulse 70   Temp 98.2 F (36.8 C) (Oral)   Resp 14   Ht 5\' 9"  (1.753 m)   Wt 209 lb 14.4 oz (95.2 kg)   SpO2 94%   BMI 31.00 kg/m   Body mass index is 31 kg/m.  Advanced Directives 08/05/2018 08/04/2017 01/29/2017 01/13/2017 11/11/2016 05/26/2016 01/03/2016  Does Patient Have a Medical Advance Directive? Yes Yes Yes Yes Yes Yes Yes  Type of Paramedic of [place];Living will - - - Living will;Healthcare Power of [employer] will [place];Living will  Does patient want to make changes to medical advance directive? - - - - - - -  Copy of [place] in Chart? No - copy requested - - - - No - copy requested No - copy requested    Tobacco Social History   Tobacco Use  Smoking Status Never Smoker  Smokeless Tobacco Never Used  Tobacco Comment   smoking cessation materials not required     Counseling given: No Comment: smoking cessation materials not required  Clinical Intake:  Pre-visit preparation completed: Yes  Pain : No/denies pain   BMI - recorded: 31 Nutritional Status: BMI > 30  Obese Nutritional Risks: None Diabetes: No  How often do you need to have someone help you when you read instructions, pamphlets, or other written materials from your doctor or pharmacy?: 1 - Never  Interpreter Needed?: No  Information entered by :: AEversole, LPN  Past Medical History:  Diagnosis Date  . Arthritis   . Diastolic dysfunction 5/95/6387  . GERD (gastroesophageal reflux disease)   . Heart murmur   . Hypertension   . Incidental lung  nodule, > 38mm and < 58mm 08/04/2017   4 mm ground glass nodule LLL incidentally noted on CT scan Nov 2016   Past Surgical History:  Procedure Laterality Date  . ABDOMINAL HYSTERECTOMY    . COLONOSCOPY WITH PROPOFOL N/A 10/01/2015   Procedure: COLONOSCOPY WITH PROPOFOL;  Surgeon: Lollie Sails, MD;  Location[facility] ENDOSCOPY;  Service: Endoscopy;  Laterality: N/A;  . ESOPHAGOGASTRODUODENOSCOPY (EGD) WITH PROPOFOL N/A 10/01/2015   Procedure: ESOPHAGOGASTRODUODENOSCOPY (EGD) WITH PROPOFOL;  Surgeon: Lollie Sails, MD;  Location: [facility] ENDOSCOPY;  Service: Endoscopy;  Laterality: N/A;   Family History  Problem Relation Age of Onset  . Heart disease Mother   . Diabetes Mother   . Aneurysm Mother   . Heart disease Father   . Heart attack Father   . Heart disease Sister   . Diabetes Sister   . Pneumonia Sister   . Heart disease Brother   . Diabetes Brother   . Diabetes Sister   . Heart disease Sister   . Congestive Heart Failure Sister   . Breast cancer Neg Hx    Social History   Socioeconomic History  . Marital status: Widowed    Spouse name: Not on file  . Number of children: 1  . Years of education: Not on file  . Highest education level: Master's degree (e.g., MA, MS, MEng, MEd, MSW, MBA)  Occupational History  . Occupation: Retired  Scientific laboratory technician  . Financial  resource strain: Not hard at all  . Food insecurity:    Worry: Never true    Inability: Never true  . Transportation needs:    Medical: No    Non-medical: No  Tobacco Use  . Smoking status: Never Smoker  . Smokeless tobacco: Never Used  . Tobacco comment: smoking cessation materials not required  Substance and Sexual Activity  . Alcohol use: Not Currently    Alcohol/week: 0.0 standard drinks  . Drug use: No  . Sexual activity: Not Currently  Lifestyle  . Physical activity:    Days per week: 3 days    Minutes per session: 60 min  . Stress: Not at all  Relationships  . Social connections:    Talks on  phone: Patient refused    Gets together: Patient refused    Attends religious service: Patient refused    Active member of club or organization: Patient refused    Attends meetings of clubs or organizations: Patient refused    Relationship status: Widowed  Other Topics Concern  . Not on file  Social History Narrative  . Not on file    Outpatient Encounter Medications as of 08/05/2018  Medication Sig  . aspirin EC 81 MG tablet Take 81 mg by mouth daily.  . Calcium Carbonate-Vitamin D (CALTRATE 600+D) 600-400 MG-UNIT per tablet Take by mouth.  . docusate sodium (COLACE) 250 MG capsule Take 250 mg by mouth daily.  Marland Kitchen Hyoscyamine Sulfate 0.375 MG TBCR Take 1 tablet (0.375 mg total) by mouth 2 (two) times daily as needed.  Marland Kitchen losartan-hydrochlorothiazide (HYZAAR) 100-12.5 MG tablet Take 0.5 tablets by mouth daily.  . MULTIPLE VITAMIN PO Take by mouth.  . Probiotic Product (PROBIOTIC ADVANCED PO) Take by mouth.  . triamcinolone cream (KENALOG) 0.1 % Apply 1 application topically 2 (two) times daily. If needed  . TURMERIC PO Take by mouth.   No facility-administered encounter medications on file as of 08/05/2018.     Activities of Daily Living In your present state of health, do you have any difficulty performing the following activities: 08/05/2018 02/04/2018  Hearing? N N  Comment denies hearing aids -  Vision? N N  Comment wears eyeglasses -  Difficulty concentrating or making decisions? Y N  Comment short term memory loss -  Walking or climbing stairs? N N  Dressing or bathing? N N  Doing errands, shopping? N N  Preparing Food and eating ? N -  Comment denies dentures -  Using the Toilet? N -  In the past six months, have you accidently leaked urine? N -  Do you have problems with loss of bowel control? N -  Managing your Medications? N -  Managing your Finances? N -  Housekeeping or managing your Housekeeping? N -  Some recent data might be hidden    Patient Care Team: Lada,  Satira Anis, MD as PCP - General (Family Medicine)    Assessment:   This is a routine wellness examination for Elverda.  Exercise Activities and Dietary recommendations Current Exercise Habits: The patient does not participate in regular exercise at present;Structured exercise class, Time (Minutes): 60, Frequency (Times/Week): 3, Weekly Exercise (Minutes/Week): 180, Intensity: Mild, Exercise limited by: None identified  Goals    . DIET - INCREASE WATER INTAKE     Recommend to drink at least 6-8 8oz glasses of water per day.       Fall Risk Fall Risk  08/05/2018 02/04/2018 08/04/2017 01/29/2017 01/13/2017  Falls in the past year?  No No No No No  Number falls in past yr: - - - - -  Injury with Fall? - - - - -  Risk for fall due to : Impaired vision;History of fall(s) - - - -  Risk for fall due to: Comment wears eyeglasses - - - -   FALL RISK PREVENTION PERTAINING TO HOME: Is your home free of loose throw rugs in walkways, pet beds, electrical cords, etc? Yes Is there adequate lighting in your home to reduce risk of falls?  Yes Are there stairs in or around your home WITH handrails? Yes  ASSISTIVE DEVICES UTILIZED TO PREVENT FALLS: Use of a cane, walker or w/c? No Grab bars in the bathroom? Yes  Shower chair or a place to sit while bathing? No An elevated toilet seat or a handicapped toilet? No  Timed Get Up and Go Performed: Yes. Pt ambulated 10 feet within 9 sec. Gait stead-fast and without the use of an assistive device. No intervention required at this time. Fall risk prevention has been discussed.  Community Resource Referral:  Pt declined my offer to send [employer] Referral to Care Guide for a shower chair or an elevated toilet seat.  Depression Screen PHQ 2/9 Scores 08/05/2018 02/04/2018 08/04/2017 01/29/2017  PHQ - 2 Score 0 0 0 0  PHQ- 9 Score 0 - - -     Cognitive Function     6CIT Screen 08/05/2018  What Year? 0 points  What month? 0 points  What time? 0 points    Count back from 20 0 points  Months in reverse 0 points  Repeat phrase 4 points  Total Score 4    Immunization History  Administered Date(s) Administered  . Influenza, High Dose Seasonal PF 08/04/2017, 08/05/2018  . Influenza, Seasonal, Injecte, Preservative Fre 10/10/2014  . Influenza-Unspecified 10/23/2015, 10/06/2016  . Pneumococcal Conjugate-13 10/10/2014  . Pneumococcal Polysaccharide-23 05/24/2015, 10/23/2015  . Tdap 05/24/2015    Qualifies for Shingles Vaccine? Yes. Due for Shingrix. Education has been provided regarding the importance of this vaccine. Pt has been advised to call insurance company to determine out of pocket expense. Advised may also receive vaccine at local pharmacy or Health Dept. Verbalized acceptance and understanding.  Screening Tests Health Maintenance  Topic Date Due  . MAMMOGRAM  07/05/2018  . TETANUS/TDAP  05/23/2025  . COLONOSCOPY  09/30/2025  . INFLUENZA VACCINE  Completed  . DEXA SCAN  Completed  . Hepatitis C Screening  Completed  . PNA vac Low Risk Adult  Completed    Cancer Screenings: Lung: Low Dose CT Chest recommended if Age 21-80 years, 30 pack-year currently smoking OR have quit w/in 15years. Patient does not qualify. Breast:  Up to date on Mammogram? No. Completed 07/06/17. Repeat every year. Ordered today. Provided with contact info and advised to schedule appt  Up to date of Bone Density/Dexa? No. Completed 07/01/16. Results were normal. Repeat every 2 years. Ordered today. Provided with contact info and advised to schedule appt   Colorectal: Completed 10/01/15. Repeat every 10 years  Additional Screenings: Hepatitis C Screening: Completed 06/13/15    Plan:  I have personally reviewed and addressed the Medicare Annual Wellness questionnaire and have noted the following in the patient's chart:  A. Medical and social history B. Use of alcohol, tobacco or illicit drugs  C. Current medications and supplements D. Functional ability  and status E.  Nutritional status F.  Physical activity G. Advance directives H. List of other physicians I.  Hospitalizations,  surgeries, and ER visits in previous 12 months J.  [place] such as hearing and vision if needed, cognitive and depression L. Referrals and appointments  In addition, I have reviewed and discussed with patient certain preventive protocols, quality metrics, and best practice recommendations. A written personalized care plan for preventive services as well as general preventive health recommendations were provided to patient.  See attached scanned questionnaire for additional information.   Signed,  Aleatha Borer, LPN Nurse Health Advisor

## 2018-08-05 NOTE — Patient Instructions (Signed)
Ruth Bishop , Thank you for taking time to come for your Medicare Wellness Visit. I appreciate your ongoing commitment to your health goals. Please review the following plan we discussed and let me know if I can assist you in the future.   Screening recommendations/referrals: Colorectal Screening: Up to date Mammogram: Please call to schedule your appointment Bone Density: Please call to schedule your appointment  Vision and Dental Exams: Recommended annual ophthalmology exams for early detection of glaucoma and other disorders of the eye Recommended annual dental exams for proper oral hygiene  Vaccinations: Influenza vaccine: Completed today Pneumococcal vaccine: Up to date Tdap vaccine: Up to date Shingles vaccine: Please call your insurance company to determine your out of pocket expense for the Shingrix vaccine. You may also receive this vaccine at your local pharmacy or Health Dept.    Advanced directives: Advance directive discussed with you today. I have provided a copy for you to complete at home and have notarized. Once this is complete please bring a copy in to our office so we can scan it into your chart.  OR  We have received a copy of your POA (Power of Attorney) and/or Living Will. These documents can be located in your chart.  OR  Please bring a copy of your POA (Power of Attorney) and/or Living Will to your next appointment.  Goals: Recommend to drink at least 6-8 8oz glasses of water per day.  Next appointment: Please schedule your Annual Wellness Visit with your Nurse Health Advisor in one year.  Preventive Care 70 Years and Older, Female Preventive care refers to lifestyle choices and visits with your health care provider that can promote health and wellness. What does preventive care include?  A yearly physical exam. This is also called an annual well check.  Dental exams once or twice a year.  Routine eye exams. Ask your health care provider how often you  should have your eyes checked.  Personal lifestyle choices, including:  Daily care of your teeth and gums.  Regular physical activity.  Eating a healthy diet.  Avoiding tobacco and drug use.  Limiting alcohol use.  Practicing safe sex.  Taking low-dose aspirin every day.  Taking vitamin and mineral supplements as recommended by your health care provider. What happens during an annual well check? The services and screenings done by your health care provider during your annual well check will depend on your age, overall health, lifestyle risk factors, and family history of disease. Counseling  Your health care provider may ask you questions about your:  Alcohol use.  Tobacco use.  Drug use.  Emotional well-being.  Home and relationship well-being.  Sexual activity.  Eating habits.  History of falls.  Memory and ability to understand (cognition).  Work and work Statistician.  Reproductive health. Screening  You may have the following tests or measurements:  Height, weight, and BMI.  Blood pressure.  Lipid and cholesterol levels. These may be checked every 5 years, or more frequently if you are over 8 years old.  Skin check.  Lung cancer screening. You may have this screening every year starting at age 44 if you have a 30-pack-year history of smoking and currently smoke or have quit within the past 15 years.  Fecal occult blood test (FOBT) of the stool. You may have this test every year starting at age 50.  Flexible sigmoidoscopy or colonoscopy. You may have a sigmoidoscopy every 5 years or a colonoscopy every 10 years starting at age 16.  Hepatitis C blood test.  Hepatitis B blood test.  Sexually transmitted disease (STD) testing.  Diabetes screening. This is done by checking your blood sugar (glucose) after you have not eaten for a while (fasting). You may have this done every 1-3 years.  Bone density scan. This is done to screen for osteoporosis.  You may have this done starting at age 83.  Mammogram. This may be done every 1-2 years. Talk to your health care provider about how often you should have regular mammograms. Talk with your health care provider about your test results, treatment options, and if necessary, the need for more tests. Vaccines  Your health care provider may recommend certain vaccines, such as:  Influenza vaccine. This is recommended every year.  Tetanus, diphtheria, and acellular pertussis (Tdap, Td) vaccine. You may need a Td booster every 10 years.  Zoster vaccine. You may need this after age 48.  Pneumococcal 13-valent conjugate (PCV13) vaccine. One dose is recommended after age 23.  Pneumococcal polysaccharide (PPSV23) vaccine. One dose is recommended after age 31. Talk to your health care provider about which screenings and vaccines you need and how often you need them. This information is not intended to replace advice given to you by your health care provider. Make sure you discuss any questions you have with your health care provider. Document Released: 12/20/2015 Document Revised: 08/12/2016 Document Reviewed: 09/24/2015 Elsevier Interactive Patient Education  2017 [place] Prevention in the Home Falls can cause injuries. They can happen to people of all ages. There are many things you can do to make your home safe and to help prevent falls. What can I do on the outside of my home?  Regularly fix the edges of walkways and driveways and fix any cracks.  Remove anything that might make you trip as you walk through a door, such as a raised step or threshold.  Trim any bushes or trees on the path to your home.  Use bright outdoor lighting.  Clear any walking paths of anything that might make someone trip, such as rocks or tools.  Regularly check to see if handrails are loose or broken. Make sure that both sides of any steps have handrails.  Any raised decks and porches should have  guardrails on the edges.  Have any leaves, snow, or ice cleared regularly.  Use sand or salt on walking paths during winter.  Clean up any spills in your garage right away. This includes oil or grease spills. What can I do in the bathroom?  Use night lights.  Install grab bars by the toilet and in the tub and shower. Do not use towel bars as grab bars.  Use non-skid mats or decals in the tub or shower.  If you need to sit down in the shower, use a plastic, non-slip stool.  Keep the floor dry. Clean up any water that spills on the floor as soon as it happens.  Remove soap buildup in the tub or shower regularly.  Attach bath mats securely with double-sided non-slip rug tape.  Do not have throw rugs and other things on the floor that can make you trip. What can I do in the bedroom?  Use night lights.  Make sure that you have a light by your bed that is easy to reach.  Do not use any sheets or blankets that are too big for your bed. They should not hang down onto the floor.  Have a firm chair that has side  arms. You can use this for support while you get dressed.  Do not have throw rugs and other things on the floor that can make you trip. What can I do in the kitchen?  Clean up any spills right away.  Avoid walking on wet floors.  Keep items that you use a lot in easy-to-reach places.  If you need to reach something above you, use a strong step stool that has a grab bar.  Keep electrical cords out of the way.  Do not use floor polish or wax that makes floors slippery. If you must use wax, use non-skid floor wax.  Do not have throw rugs and other things on the floor that can make you trip. What can I do with my stairs?  Do not leave any items on the stairs.  Make sure that there are handrails on both sides of the stairs and use them. Fix handrails that are broken or loose. Make sure that handrails are as long as the stairways.  Check any carpeting to make sure that  it is firmly attached to the stairs. Fix any carpet that is loose or worn.  Avoid having throw rugs at the top or bottom of the stairs. If you do have throw rugs, attach them to the floor with carpet tape.  Make sure that you have a light switch at the top of the stairs and the bottom of the stairs. If you do not have them, ask someone to add them for you. What else can I do to help prevent falls?  Wear shoes that:  Do not have high heels.  Have rubber bottoms.  Are comfortable and fit you well.  Are closed at the toe. Do not wear sandals.  If you use a stepladder:  Make sure that it is fully opened. Do not climb a closed stepladder.  Make sure that both sides of the stepladder are locked into place.  Ask someone to hold it for you, if possible.  Clearly mark and make sure that you can see:  Any grab bars or handrails.  First and last steps.  Where the edge of each step is.  Use tools that help you move around (mobility aids) if they are needed. These include:  Canes.  Walkers.  Scooters.  Crutches.  Turn on the lights when you go into a dark area. Replace any light bulbs as soon as they burn out.  Set up your furniture so you have a clear path. Avoid moving your furniture around.  If any of your floors are uneven, fix them.  If there are any pets around you, be aware of where they are.  Review your medicines with your doctor. Some medicines can make you feel dizzy. This can increase your chance of falling. Ask your doctor what other things that you can do to help prevent falls. This information is not intended to replace advice given to you by your health care provider. Make sure you discuss any questions you have with your health care provider. Document Released: 09/19/2009 Document Revised: 04/30/2016 Document Reviewed: 12/28/2014 Elsevier Interactive Patient Education  2017 [employer].

## 2018-08-14 ENCOUNTER — Telehealth: Payer: Self-pay | Admitting: Family Medicine

## 2018-08-14 DIAGNOSIS — R911 Solitary pulmonary nodule: Secondary | ICD-10-CM

## 2018-08-14 DIAGNOSIS — R918 Other nonspecific abnormal finding of lung field: Secondary | ICD-10-CM

## 2018-08-14 NOTE — Telephone Encounter (Signed)
Please let the patient know that she is due for her repeat chest CT (I ordered it) Please let her know how to get that scheduled Thank you

## 2018-08-14 NOTE — Telephone Encounter (Signed)
-----   Message from Arnetha Courser, MD sent at 08/25/2017  4:02 PM EDT ----- Regarding: time to order chest CT w/o contrast 1. Part solid left upper lobe nodule with solid component measuring 5 mm, stable at 6 months when compared to the previous neck CT. Recommend 1 year follow-up chest CT without contrast.

## 2018-08-17 NOTE — Telephone Encounter (Signed)
Pt.notified

## 2018-08-25 ENCOUNTER — Ambulatory Visit
Admission: RE | Admit: 2018-08-25 | Discharge: 2018-08-25 | Disposition: A | Discharge status: Home / Self Care | Payer: Medicare HMO | Source: Ambulatory Visit | Attending: Family Medicine | Admitting: Family Medicine

## 2018-08-25 DIAGNOSIS — Z1231 Encounter for screening mammogram for malignant neoplasm of breast: Secondary | ICD-10-CM | POA: Diagnosis not present

## 2018-08-25 DIAGNOSIS — Z1239 Encounter for other screening for malignant neoplasm of breast: Secondary | ICD-10-CM

## 2018-08-25 DIAGNOSIS — E2839 Other primary ovarian failure: Secondary | ICD-10-CM

## 2018-08-26 ENCOUNTER — Ambulatory Visit: Payer: Medicare HMO

## 2018-08-30 ENCOUNTER — Ambulatory Visit
Admission: RE | Admit: 2018-08-30 | Discharge: 2018-08-30 | Disposition: A | Discharge status: Home / Self Care | Payer: Medicare HMO | Source: Ambulatory Visit | Attending: Family Medicine | Admitting: Family Medicine

## 2018-08-30 DIAGNOSIS — R918 Other nonspecific abnormal finding of lung field: Secondary | ICD-10-CM | POA: Diagnosis not present

## 2018-08-30 DIAGNOSIS — R911 Solitary pulmonary nodule: Secondary | ICD-10-CM

## 2018-09-05 ENCOUNTER — Other Ambulatory Visit: Payer: Self-pay | Admitting: Family Medicine

## 2018-09-05 DIAGNOSIS — R918 Other nonspecific abnormal finding of lung field: Secondary | ICD-10-CM

## 2018-09-05 NOTE — Progress Notes (Signed)
Ct surg referral

## 2018-09-21 ENCOUNTER — Telehealth: Payer: Self-pay | Admitting: Family Medicine

## 2018-09-21 DIAGNOSIS — R911 Solitary pulmonary nodule: Secondary | ICD-10-CM

## 2018-09-21 NOTE — Telephone Encounter (Signed)
I called to discuss her CT scan and questions about having Dr. Genevive Bi follow going forward I left a detailed message on her cell phone (recognized her voice) I acknowledge her discussion with folks at cancer center that she does not want to go there I'll put in a remind to myself to repeat her scan in Sept 2020 If she develops any weight loss, night sweats, cough, other symptoms before then, make an appt to see me Call or write through [place] with questions or concerns  -------------------------------------  "Remind Me" entered to appear Sept 1, 2020

## 2018-09-22 ENCOUNTER — Telehealth: Payer: Self-pay

## 2018-09-22 NOTE — Telephone Encounter (Signed)
Copied from [place] [id number]. Topic: General - Other >> Sep 21, 2018  5:10 PM Marin Olp L wrote: Reason for CRM: Patient missed a call. Sya's it was just a few minutes ago.

## 2018-09-22 NOTE — Telephone Encounter (Signed)
I spoke with patient, answered questions about the lung nodule; she denies any symptoms; she agrees to repeat scanning yearly per recommendations but does not want to establish with CT surgeon She said she is going to return in a few weeks about her handicapped parking request; I explained my chart does not document that she is handicapped and in fact that she is an avid tennis player; we'll talk at her appt

## 2018-10-05 ENCOUNTER — Encounter: Payer: Medicare HMO | Admitting: Family Medicine

## 2018-10-06 ENCOUNTER — Other Ambulatory Visit: Payer: Self-pay | Admitting: Family Medicine

## 2018-10-13 ENCOUNTER — Ambulatory Visit
Admission: RE | Admit: 2018-10-13 | Discharge: 2018-10-13 | Disposition: A | Discharge status: Home / Self Care | Payer: Medicare HMO | Source: Ambulatory Visit | Attending: Family Medicine | Admitting: Family Medicine

## 2018-10-13 ENCOUNTER — Ambulatory Visit (INDEPENDENT_AMBULATORY_CARE_PROVIDER_SITE_OTHER): Payer: Medicare HMO | Admitting: Family Medicine

## 2018-10-13 ENCOUNTER — Encounter: Payer: Self-pay | Admitting: Family Medicine

## 2018-10-13 VITALS — BP 130/78 | HR 75 | Temp 98.2°F | Ht 69.0 in | Wt 206.2 lb

## 2018-10-13 DIAGNOSIS — R05 Cough: Secondary | ICD-10-CM | POA: Diagnosis not present

## 2018-10-13 DIAGNOSIS — Z6830 Body mass index (BMI) 30.0-30.9, adult: Secondary | ICD-10-CM | POA: Diagnosis not present

## 2018-10-13 DIAGNOSIS — R7303 Prediabetes: Secondary | ICD-10-CM

## 2018-10-13 DIAGNOSIS — R0989 Other specified symptoms and signs involving the circulatory and respiratory systems: Secondary | ICD-10-CM

## 2018-10-13 DIAGNOSIS — E6609 Other obesity due to excess calories: Secondary | ICD-10-CM | POA: Diagnosis not present

## 2018-10-13 DIAGNOSIS — R059 Cough, unspecified: Secondary | ICD-10-CM

## 2018-10-13 DIAGNOSIS — Z0000 Encounter for general adult medical examination without abnormal findings: Secondary | ICD-10-CM | POA: Diagnosis not present

## 2018-10-13 DIAGNOSIS — E669 Obesity, unspecified: Secondary | ICD-10-CM

## 2018-10-13 DIAGNOSIS — I10 Essential (primary) hypertension: Secondary | ICD-10-CM | POA: Diagnosis not present

## 2018-10-13 MED ORDER — DOXYCYCLINE HYCLATE 100 MG PO TABS: 100.0000 mg | ORAL_TABLET | Freq: Two times a day (BID) | ORAL | 0 refills | Status: DC

## 2018-10-13 MED ORDER — BENZONATATE 100 MG PO CAPS: 100.0000 mg | ORAL_CAPSULE | Freq: Three times a day (TID) | ORAL | 0 refills | Status: DC | PRN

## 2018-10-13 NOTE — Assessment & Plan Note (Signed)
USPSTF grade A and B recommendations reviewed with patient; age-appropriate recommendations, preventive care, screening tests, etc discussed and encouraged; healthy living encouraged; see AVS for patient education given to patient  

## 2018-10-13 NOTE — Patient Instructions (Addendum)
Please get a chest xray across the street today Drink plenty of liquids Get plenty of rest Use the new cough medicine if needed  Check out the information at familydoctor.org entitled "Nutrition for Weight Loss: What You Need to Know about Fad Diets" Try to lose between 1-2 pounds per week by taking in fewer calories and burning off more calories You can succeed by limiting portions, limiting foods dense in calories and fat, becoming more active, and drinking 8 glasses of water a day (64 ounces) Don't skip meals, especially breakfast, as skipping meals may alter your metabolism Do not use over-the-counter weight loss pills or gimmicks that claim rapid weight loss A healthy BMI (or body mass index) is between 18.5 and 24.9 You can calculate your ideal BMI at the [place] website ClubMonetize.fr  Start antibiotics Please do eat yogurt or kimchi or take a probiotic daily for the next month We want to replace the healthy germs in the gut If you notice foul, watery diarrhea in the next two months, schedule an appointment RIGHT AWAY or go to an urgent care or the emergency room if a holiday or over a weekend  Obesity, Adult Obesity is the condition of having too much total body fat. Being overweight or obese means that your weight is greater than what is considered healthy for your body size. Obesity is determined by a measurement called BMI. BMI is an estimate of body fat and is calculated from height and weight. For adults, a BMI of 30 or higher is considered obese. Obesity can eventually lead to other health concerns and major illnesses, including:  Stroke.  Coronary artery disease (CAD).  Type 2 diabetes.  Some types of cancer, including cancers of the colon, breast, uterus, and gallbladder.  Osteoarthritis.  High blood pressure (hypertension).  High cholesterol.  Sleep apnea.  Gallbladder stones.  Infertility problems.  What are  the causes? The main cause of obesity is taking in (consuming) more calories than your body uses for energy. Other factors that contribute to this condition may include:  Being born with genes that make you more likely to become obese.  Having a medical condition that causes obesity. These conditions include: ? Hypothyroidism. ? Polycystic ovarian syndrome (PCOS). ? Binge-eating disorder. ? Cushing syndrome.  Taking certain medicines, such as steroids, antidepressants, and seizure medicines.  Not being physically active (sedentary lifestyle).  Living where there are limited places to exercise safely or buy healthy foods.  Not getting enough sleep.  What increases the risk? The following factors may increase your risk of this condition:  Having a family history of obesity.  Being a woman of African-American descent.  Being a man of Hispanic descent.  What are the signs or symptoms? Having excessive body fat is the main symptom of this condition. How is this diagnosed? This condition may be diagnosed based on:  Your symptoms.  Your medical history.  A physical exam. Your health care provider may measure: ? Your BMI. If you are an adult with a BMI between 25 and less than 30, you are considered overweight. If you are an adult with a BMI of 30 or higher, you are considered obese. ? The distances around your hips and your waist (circumferences). These may be compared to each other to help diagnose your condition. ? Your skinfold thickness. Your health care provider may gently pinch a fold of your skin and measure it.  How is this treated? Treatment for this condition often includes changing your lifestyle.  Treatment may include some or all of the following:  Dietary changes. Work with your health care provider and a dietitian to set a weight-loss goal that is healthy and reasonable for you. Dietary changes may include eating: ? Smaller portions. A portion size is the amount  of a particular food that is healthy for you to eat at one time. This varies from person to person. ? Low-calorie or low-fat options. ? More whole grains, fruits, and vegetables.  Regular physical activity. This may include aerobic activity (cardio) and strength training.  Medicine to help you lose weight. Your health care provider may prescribe medicine if you are unable to lose 1 pound a week after 6 weeks of eating more healthily and doing more physical activity.  Surgery. Surgical options may include gastric banding and gastric bypass. Surgery may be done if: ? Other treatments have not helped to improve your condition. ? You have a BMI of 40 or higher. ? You have life-threatening health problems related to obesity.  Follow these instructions at home:  Eating and drinking   Follow recommendations from your health care provider about what you eat and drink. Your health care provider may advise you to: ? Limit fast foods, sweets, and processed snack foods. ? Choose low-fat options, such as low-fat milk instead of whole milk. ? Eat 5 or more servings of fruits or vegetables every day. ? Eat at home more often. This gives you more control over what you eat. ? Choose healthy foods when you eat out. ? Learn what a healthy portion size is. ? Keep low-fat snacks on hand. ? Avoid sugary drinks, such as soda, fruit juice, iced tea sweetened with sugar, and flavored milk. ? Eat a healthy breakfast.  Drink enough water to keep your urine clear or pale yellow.  Do not go without eating for long periods of time (do not fast) or follow a fad diet. Fasting and fad diets can be unhealthy and even dangerous. Physical Activity  Exercise regularly, as told by your health care provider. Ask your health care provider what types of exercise are safe for you and how often you should exercise.  Warm up and stretch before being active.  Cool down and stretch after being active.  Rest between  periods of activity. Lifestyle  Limit the time that you spend in front of your TV, computer, or video game system.  Find ways to reward yourself that do not involve food.  Limit alcohol intake to no more than 1 drink a day for nonpregnant women and 2 drinks a day for men. One drink equals 12 oz of beer, 5 oz of wine, or 1 oz of hard liquor. General instructions  Keep a weight loss journal to keep track of the food you eat and how much you exercise you get.  Take over-the-counter and prescription medicines only as told by your health care provider.  Take vitamins and supplements only as told by your health care provider.  Consider joining a support group. Your health care provider may be able to recommend a support group.  Keep all follow-up visits as told by your health care provider. This is important. Contact a health care provider if:  You are unable to meet your weight loss goal after 6 weeks of dietary and lifestyle changes. This information is not intended to replace advice given to you by your health care provider. Make sure you discuss any questions you have with your health care provider. Document Released: 12/31/2004  Document Revised: 04/27/2016 Document Reviewed: 09/11/2015 Elsevier Interactive Patient Education  2018 [employer].  Preventing Unhealthy [employer], Adult Staying at a healthy weight is important. When fat builds up in your body, you may become overweight or obese. These conditions put you at greater risk for developing certain health problems, such as heart disease, diabetes, sleeping problems, joint problems, and some cancers. Unhealthy weight gain is often the result of making unhealthy choices in what you eat. It is also a result of not getting enough exercise. You can make changes to your lifestyle to prevent obesity and stay as healthy as possible. What nutrition changes can be made? To maintain a healthy weight and prevent obesity:  Eat only as much  as your body needs. To do this: ? Pay attention to signs that you are hungry or full. Stop eating as soon as you feel full. ? If you feel hungry, try drinking water first. Drink enough water so your urine is clear or pale yellow. ? Eat smaller portions. ? Look at serving sizes on food labels. Most foods contain more than one serving per container. ? Eat the recommended amount of calories for your gender and activity level. While most active people should eat around 2,000 calories per day, if you are trying to lose weight or are not very active, you main need to eat less calories. Talk to your health care provider or dietitian about how many calories you should eat each day.  Choose healthy foods, such as: ? Fruits and vegetables. Try to fill at least half of your plate at each meal with fruits and vegetables. ? Whole grains, such as whole wheat bread, brown rice, and quinoa. ? Lean meats, such as chicken or fish. ? Other healthy proteins, such as beans, eggs, or tofu. ? Healthy fats, such as nuts, seeds, fatty fish, and olive oil. ? Low-fat or fat-free dairy.  Check food labels and avoid food and drinks that: ? Are high in calories. ? Have added sugar. ? Are high in sodium. ? Have saturated fats or trans fats.  Limit how much you eat of the following foods: ? Prepackaged meals. ? Fast food. ? Fried foods. ? Processed meat, such as bacon, sausage, and deli meats. ? Fatty cuts of red meat and poultry with skin.  Cook foods in healthier ways, such as by baking, broiling, or grilling.  When grocery shopping, try to shop around the outside of the store. This helps you buy mostly fresh foods and avoid canned and prepackaged foods.  What lifestyle changes can be made?  Exercise at least 30 minutes 5 or more days each week. Exercising includes brisk walking, yard work, biking, running, swimming, and team sports like basketball and soccer. Ask your health care provider which exercises are  safe for you.  Do not use any products that contain nicotine or tobacco, such as cigarettes and e-cigarettes. If you need help quitting, ask your health care provider.  Limit alcohol intake to no more than 1 drink a day for nonpregnant women and 2 drinks a day for men. One drink equals 12 oz of beer, 5 oz of wine, or 1 oz of hard liquor.  Try to get 7-9 hours of sleep each night. What other changes can be made?  Keep a food and activity journal to keep track of: ? What you ate and how many calories you had. Remember to count sauces, dressings, and side dishes. ? Whether you were active, and what exercises you  did. ? Your calorie, weight, and activity goals.  Check your weight regularly. Track any changes. If you notice you have gained weight, make changes to your diet or activity routine.  Avoid taking weight-loss medicines or supplements. Talk to your health care provider before starting any new medicine or supplement.  Talk to your health care provider before trying any new diet or exercise plan. Why are these changes important? Eating healthy, staying active, and having healthy habits not only help prevent obesity, they also:  Help you to manage stress and emotions.  Help you to connect with friends and family.  Improve your self-esteem.  Improve your sleep.  Prevent long-term health problems.  What can happen if changes are not made? Being obese or overweight can cause you to develop joint or bone problems, which can make it hard for you to stay active or do activities you enjoy. Being obese or overweight also puts stress on your heart and lungs and can lead to health problems like diabetes, heart disease, and some cancers. Where to find more information: Talk with your health care provider or a dietitian about healthy eating and healthy lifestyle choices. You may also find other information through these resources:  [place] Department of Agriculture MyPlate:  FormerBoss.no  American Heart Association: www.heart.org  Centers for Disease Control and Prevention: http://www.wolf.info/  Summary  Staying at a healthy weight is important. It helps prevent certain diseases and health problems, such as heart disease, diabetes, joint problems, sleep disorders, and some cancers.  Being obese or overweight can cause you to develop joint or bone problems, which can make it hard for you to stay active or do activities you enjoy.  You can prevent unhealthy weight gain by eating a healthy diet, exercising regularly, not smoking, limiting alcohol, and getting enough sleep.  Talk with your health care provider or a dietitian for guidance about healthy eating and healthy lifestyle choices. This information is not intended to replace advice given to you by your health care provider. Make sure you discuss any questions you have with your health care provider. Document Released: 11/24/2016 Document Revised: 12/30/2016 Document Reviewed: 12/30/2016 Elsevier Interactive Patient Education  [employer].

## 2018-10-13 NOTE — Progress Notes (Signed)
Patient ID: Ruth Bishop, female   DOB: August 31, 1946, 72 y.o.   MRN: [id number]   Subjective:   Ruth Bishop is a 72 y.o. female here for a complete physical exam  Interim issues since last visit: since last visit, had asked for a handicapped placard; she fell [place] the middle of the [place] and walks with a cane; that was a while ago actually, way before; she used to play tennis but cannot play tennis anymore because of walking and SHOB; both legs feel weak; using cane; offered PT but she declined  USPSTF grade A and B recommendations Depression:  Depression screen [facility] 2/9 10/20/2018 10/13/2018 08/05/2018 02/04/2018 08/04/2017  Decreased Interest 0 0 0 0 0  Down, Depressed, Hopeless 0 0 0 0 0  PHQ - 2 Score 0 0 0 0 0  Altered sleeping 0 0 0 - -  Tired, decreased energy 0 0 0 - -  Change in appetite 0 0 0 - -  Feeling bad or failure about yourself  0 0 0 - -  Trouble concentrating 0 0 0 - -  Moving slowly or fidgety/restless 0 0 0 - -  Suicidal thoughts 0 0 0 - -  PHQ-9 Score 0 0 0 - -  Difficult doing work/chores Not difficult at all Not difficult at all Not difficult at all - -   Hypertension: BP Readings from Last 3 Encounters:  10/28/18 (!) 142/70  10/20/18 128/76  10/13/18 130/78   Obesity: eating better Wt Readings from Last 3 Encounters:  10/28/18 209 lb (94.8 kg)  10/20/18 206 lb 11.2 oz (93.8 kg)  10/13/18 206 lb 3.2 oz (93.5 kg)   BMI Readings from Last 3 Encounters:  10/28/18 30.86 kg/m  10/20/18 30.52 kg/m  10/13/18 30.45 kg/m    Skin cancer: no worrisome moles Lung cancer:  Screening for nodules; nonsmoker Breast cancer: just had mammogram Sept 2019; no palpable lumps or bumps Colorectal cancer: 2016 Cervical cancer screening: graduated BRCA gene screening: family hx of breast and/or ovarian cancer and/or metastatic prostate cancer? no HIV, hep B, hep C: not needed by patient STD testing and prevention (chl/gon/syphilis): not intersted Intimate partner  violence: no abuse  Osteoporosis: dark greens, cow's milk Fall prevention/vitamin D: discussed Immunizations: pneumonia UTD; shingles vaccine discussed, she'll get at pharmacy; flu shot UTD Diet: very little fried foods; likes fish, seafood, baked chicken; occasional fried chicken every now and then, occasional hamburger, likes veggies Exercise: she used to be active and played tennis; now has shortness of breath at times; has a cold now; back was bothering her  Coughing and has chest congestion; got sick about 10 days ago; no visits; no travel; tried mucinex extra strength DM and something for congestion; worried about it being in her chest; caught a cold; walking with a cane now; missed a step and caught herself with her hands; has SHOB, can't walk as far as she used to; that was after talking to the wellness nurse; had chest cold once and had pericarditis; no wheezing; hard coughing; she thinks the worst is over but still having the cough; no rash   Alcohol:    [facility] Visit from 10/13/2018 in [facility]  AUDIT-C Score  1     Tobacco use: nonsmoker essentially; just tried it AAA: no Aspirin: saw heart doctor, Dr. Rockey Situ; taking aspirin daily 81 mg no bleeding Glucose:  Glucose, Bld  Date Value Ref Range Status  10/13/2018 92 65 - 99 mg/dL Final  Comment:    .            Fasting reference interval .   02/04/2018 94 65 - 99 mg/dL Final    Comment:    .            Fasting reference interval .   08/04/2017 90 65 - 99 mg/dL Final   Lipids:  Lab Results  Component Value Date   CHOL 134 10/13/2018   CHOL 142 08/04/2017   CHOL 138 11/11/2016   Lab Results  Component Value Date   HDL 39 (L) 10/13/2018   HDL 60 08/04/2017   HDL 58 11/11/2016   Lab Results  Component Value Date   LDLCALC 64 10/13/2018   LDLCALC 55 08/04/2017   LDLCALC 62 11/11/2016   Lab Results  Component Value Date   TRIG 261 (H) 10/13/2018   TRIG 135 08/04/2017   TRIG 88  11/11/2016   Lab Results  Component Value Date   CHOLHDL 3.4 10/13/2018   CHOLHDL 2.4 08/04/2017   CHOLHDL 2.4 11/11/2016   No results found for: LDLDIRECT   Past Medical History:  Diagnosis Date  . Arthritis   . Diastolic dysfunction 5/99/3570  . GERD (gastroesophageal reflux disease)   . Heart murmur   . Hypertension   . Incidental lung nodule, > 62m and < 83m8/29/2018   4 mm ground glass nodule LLL incidentally noted on CT scan Nov 2016   Past Surgical History:  Procedure Laterality Date  . ABDOMINAL HYSTERECTOMY    . COLONOSCOPY WITH PROPOFOL N/A 10/01/2015   Procedure: COLONOSCOPY WITH PROPOFOL;  Surgeon: MaLollie SailsMD;  [facility];  Service: Endoscopy;  Laterality: N/A;  . ESOPHAGOGASTRODUODENOSCOPY (EGD) WITH PROPOFOL N/A 10/01/2015   Procedure: ESOPHAGOGASTRODUODENOSCOPY (EGD) WITH PROPOFOL;  Surgeon: MaLollie SailsMD;  [facility];  Service: Endoscopy;  Laterality: N/A;   Family History  Problem Relation Age of Onset  . Heart disease Mother   . Diabetes Mother   . Aneurysm Mother   . Heart disease Father   . Heart attack Father   . Heart disease Sister   . Diabetes Sister   . Pneumonia Sister   . Heart disease Brother   . Diabetes Brother   . Diabetes Sister   . Heart disease Sister   . Congestive Heart Failure Sister   . Breast cancer Neg Hx    Social History   Tobacco Use  . Smoking status: Never Smoker  . Smokeless tobacco: Never Used  . Tobacco comment: smoking cessation materials not required  Substance Use Topics  . Alcohol use: Not Currently    Alcohol/week: 0.0 standard drinks  . Drug use: No   Review of Systems  Constitutional: Positive for fever (one in the beginning but that broke). Negative for unexpected weight change.  Respiratory: Positive for cough. Negative for wheezing.     Objective:   Vitals:   10/13/18 1418  BP: 130/78  Pulse: 75  Temp: 98.2 F (36.8 C)  TempSrc: Oral  SpO2:  98%  Weight: 206 lb 3.2 oz (93.5 kg)  Height: _0  (1.753 m)   Body mass index is 30.45 kg/m. Wt Readings from Last 3 Encounters:  10/28/18 209 lb (94.8 kg)  10/20/18 206 lb 11.2 oz (93.8 kg)  10/13/18 206 lb 3.2 oz (93.5 kg)   Physical Exam  Constitutional: She appears well-developed and well-nourished.  HENT:  Right Ear: Hearing, tympanic membrane, external ear and ear canal normal.  Left Ear: Hearing, tympanic membrane, external ear and ear canal normal.  Mouth/Throat: Mucous membranes are normal. No posterior oropharyngeal edema or posterior oropharyngeal erythema.  Eyes: Conjunctivae and EOM are normal. Right eye exhibits no hordeolum. Left eye exhibits no hordeolum. No scleral icterus.  Neck: Carotid bruit is not present. No thyromegaly present.  Cardiovascular: Normal rate, regular rhythm, S1 normal, S2 normal and normal heart sounds.  No extrasystoles are present.  Pulmonary/Chest: Effort normal and breath sounds normal. No respiratory distress.  Abdominal: Soft. Normal appearance and bowel sounds are normal. She exhibits no distension, no abdominal bruit, no pulsatile midline mass and no mass. There is no hepatosplenomegaly. There is no tenderness. No hernia.  Musculoskeletal: Normal range of motion. She exhibits no edema.  Lymphadenopathy:       Head (right side): No submandibular adenopathy present.       Head (left side): No submandibular adenopathy present.    She has no cervical adenopathy.  Neurological: She is alert. She displays no tremor. She exhibits normal muscle tone. Gait normal.  Reflex Scores:      Patellar reflexes are 2+ on the right side and 2+ on the left side. Skin: Skin is warm and dry. No bruising and no ecchymosis noted. No cyanosis. No pallor.  Psychiatric: She has a normal mood and affect. Her speech is normal and behavior is normal. Thought content normal. Her mood appears not anxious. She does not exhibit a depressed mood.    Assessment/Plan:    Problem List Items Addressed This Visit      Other   Preventative health care - Primary    USPSTF grade A and B recommendations reviewed with patient; age-appropriate recommendations, preventive care, screening tests, etc discussed and encouraged; healthy living encouraged; see AVS for patient education given to patient       Prediabetes    Check glucose and A1c      Relevant Orders   Lipid panel (Completed)   Hemoglobin A1c (Completed)   Obese    Encouraged weight loss, next step is under BMI 30       Other Visit Diagnoses    Cough       Relevant Orders   DG Chest 2 View (Completed)   CBC with Differential/Platelet (Completed)   COMPLETE METABOLIC PANEL WITH GFR (Completed)   Chest congestion       Relevant Orders   DG Chest 2 View (Completed)   CBC with Differential/Platelet (Completed)   COMPLETE METABOLIC PANEL WITH GFR (Completed)       Meds ordered this encounter  Medications  . benzonatate (TESSALON PERLES) 100 MG capsule    Sig: Take 1 capsule (100 mg total) by mouth every 8 (eight) hours as needed for cough.    Dispense:  30 capsule    Refill:  0  . DISCONTD: doxycycline (VIBRA-TABS) 100 MG tablet    Sig: Take 1 tablet (100 mg total) by mouth 2 (two) times daily.    Dispense:  20 tablet    Refill:  0   Orders Placed This Encounter  Procedures  . DG Chest 2 View    Standing Status:   Future    Number of Occurrences:   1    Standing Expiration Date:   12/13/2018    Order Specific Question:   Reason for Exam (SYMPTOM  OR DIAGNOSIS REQUIRED)    Answer:   cough, chest congestion    Order Specific Question:   Preferred imaging location?  Answer:   [facility]  . CBC with Differential/Platelet  . COMPLETE METABOLIC PANEL WITH GFR  . Lipid panel  . Hemoglobin A1c    Follow up plan: Return in about 1 week (around 10/20/2018) for follow-up visit with Dr. Sanda Klein or Suezanne Cheshire, DNP.  An After Visit Summary was printed and given to the  patient.

## 2018-10-13 NOTE — Assessment & Plan Note (Signed)
Encouraged weight loss, next step is under BMI 30

## 2018-10-13 NOTE — Assessment & Plan Note (Signed)
Check glucose and A1c 

## 2018-10-14 LAB — COMPLETE METABOLIC PANEL WITH GFR
AG RATIO: 1.5 (calc) (ref 1.0–2.5)
ALT: 15 U/L (ref 6–29)
AST: 18 U/L (ref 10–35)
Albumin: 4.2 g/dL (ref 3.6–5.1)
Alkaline phosphatase (APISO): 100 U/L (ref 33–130)
BUN/Creatinine Ratio: 19 (calc) (ref 6–22)
BUN: 18 mg/dL (ref 7–25)
CALCIUM: 9.7 mg/dL (ref 8.6–10.4)
CO2: 29 mmol/L (ref 20–32)
Chloride: 105 mmol/L (ref 98–110)
Creat: 0.94 mg/dL — ABNORMAL HIGH (ref 0.60–0.93)
GFR, EST AFRICAN AMERICAN: 71 mL/min/{1.73_m2} (ref >=60)
GFR, EST NON AFRICAN AMERICAN: 61 mL/min/{1.73_m2} (ref >=60)
Globulin: 2.8 g/dL (calc) (ref 1.9–3.7)
Glucose, Bld: 92 mg/dL (ref 65–99)
POTASSIUM: 4.1 mmol/L (ref 3.5–5.3)
Sodium: 141 mmol/L (ref 135–146)
TOTAL PROTEIN: 7 g/dL (ref 6.1–8.1)
Total Bilirubin: 0.3 mg/dL (ref 0.2–1.2)

## 2018-10-14 LAB — CBC WITH DIFFERENTIAL/PLATELET
Basophils Absolute: 28 cells/uL (ref 0–200)
Basophils Relative: 0.4 %
Eosinophils Absolute: 138 cells/uL (ref 15–500)
Eosinophils Relative: 2 %
HCT: 42.1 % (ref 35.0–45.0)
Hemoglobin: 13.8 g/dL (ref 11.7–15.5)
Lymphs Abs: 3146 cells/uL (ref 850–3900)
MCH: 26.3 pg — ABNORMAL LOW (ref 27.0–33.0)
MCHC: 32.8 g/dL (ref 32.0–36.0)
MCV: 80.2 fL (ref 80.0–100.0)
MONOS PCT: 5.4 %
MPV: 10.6 fL (ref 7.5–12.5)
NEUTROS PCT: 46.6 %
Neutro Abs: 3215 cells/uL (ref 1500–7800)
PLATELETS: 310 10*3/uL (ref 140–400)
RBC: 5.25 10*6/uL — AB (ref 3.80–5.10)
RDW: 14.1 % (ref 11.0–15.0)
TOTAL LYMPHOCYTE: 45.6 %
WBC mixed population: 373 cells/uL (ref 200–950)
WBC: 6.9 10*3/uL (ref 3.8–10.8)

## 2018-10-14 LAB — LIPID PANEL
CHOL/HDL RATIO: 3.4 (calc) (ref <5.0)
Cholesterol: 134 mg/dL (ref <200)
HDL: 39 mg/dL — ABNORMAL LOW (ref >50)
LDL CHOLESTEROL (CALC): 64 mg/dL
NON-HDL CHOLESTEROL (CALC): 95 mg/dL (ref <130)
Triglycerides: 261 mg/dL — ABNORMAL HIGH (ref <150)

## 2018-10-14 LAB — HEMOGLOBIN A1C
HEMOGLOBIN A1C: 5.8 %{Hb} — AB (ref <5.7)
Mean Plasma Glucose: 120 (calc)
eAG (mmol/L): 6.6 (calc)

## 2018-10-18 ENCOUNTER — Other Ambulatory Visit: Payer: Self-pay | Admitting: Family Medicine

## 2018-10-18 DIAGNOSIS — R0602 Shortness of breath: Secondary | ICD-10-CM | POA: Insufficient documentation

## 2018-10-18 DIAGNOSIS — R9389 Abnormal findings on diagnostic imaging of other specified body structures: Secondary | ICD-10-CM | POA: Insufficient documentation

## 2018-10-18 DIAGNOSIS — R911 Solitary pulmonary nodule: Secondary | ICD-10-CM

## 2018-10-18 HISTORY — DX: Abnormal findings on diagnostic imaging of other specified body structures: R93.89

## 2018-10-18 NOTE — Assessment & Plan Note (Signed)
Refer to pulm

## 2018-10-18 NOTE — Progress Notes (Signed)
Refer to PULM

## 2018-10-18 NOTE — Assessment & Plan Note (Signed)
Refer to pulm for monitoring of this along with the abnormal CXR

## 2018-10-20 ENCOUNTER — Encounter: Payer: Self-pay | Admitting: Family Medicine

## 2018-10-20 ENCOUNTER — Ambulatory Visit (INDEPENDENT_AMBULATORY_CARE_PROVIDER_SITE_OTHER): Payer: Medicare HMO | Admitting: Family Medicine

## 2018-10-20 VITALS — BP 128/76 | HR 80 | Temp 98.3°F | Resp 14 | Ht 69.0 in | Wt 206.7 lb

## 2018-10-20 DIAGNOSIS — R0602 Shortness of breath: Secondary | ICD-10-CM | POA: Diagnosis not present

## 2018-10-20 DIAGNOSIS — R059 Cough, unspecified: Secondary | ICD-10-CM

## 2018-10-20 DIAGNOSIS — R9389 Abnormal findings on diagnostic imaging of other specified body structures: Secondary | ICD-10-CM | POA: Diagnosis not present

## 2018-10-20 DIAGNOSIS — R05 Cough: Secondary | ICD-10-CM | POA: Diagnosis not present

## 2018-10-20 NOTE — Progress Notes (Signed)
Name: Ruth Bishop   MRN: [id number]    DOB: 1946-12-07   Date:10/20/2018       Progress Note  Subjective  Chief Complaint  Chief Complaint  Patient presents with  . Follow-up    1 week recheck    HPI  PT presents for 1 week follow up on cough, shortness of breath, and abnormal CXR. She has noticed improving shortness of breath and cough.  She did have chest Xray that showed Chronic increased interstitial markings.  She has 3 more days of the Doxycyline, and taking Tessalon PRN.  Denies fevers, chest pain, body aches, headache.   Patient Active Problem List   Diagnosis Date Noted  . Shortness of breath 10/18/2018  . Abnormal chest x-ray 10/18/2018  . Preventative health care 10/13/2018  . Obese 10/13/2018  . Elevated C-reactive protein (CRP) 02/14/2018  . Elevated antinuclear antibody (ANA) level 02/14/2018  . First degree AV block 09/17/2017  . Left atrial enlargement 09/17/2017  . Incidental lung nodule, > 33mm and < 87mm 08/04/2017  . Prediabetes 11/11/2016  . Aortic regurgitation 06/17/2016  . Mitral regurgitation 06/17/2016  . Diastolic dysfunction 40/98/1191  . Hormone replacement therapy 05/26/2016  . Elevated alkaline phosphatase level 06/13/2015  . Osteoarthritis of both knees 06/13/2015  . 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 Chronic pain 05/24/2015  . Chronic constipation 05/24/2015  . 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 Cardiac murmur 05/24/2015  . Hypertension goal BP (blood pressure) < 150/90 05/24/2015  . 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  . Post menopausal syndrome 05/24/2015  . Skin-picking disorder 05/24/2015    Social History   Tobacco Use  . Smoking status: Never Smoker  . Smokeless tobacco: Never Used  . Tobacco comment: smoking cessation materials not required  Substance Use Topics  . Alcohol use: Not Currently    Alcohol/week: 0.0 standard drinks     Current Outpatient  Medications:  .  aspirin EC 81 MG tablet, Take 81 mg by mouth daily., Disp: , Rfl:  .  benzonatate (TESSALON PERLES) 100 MG capsule, Take 1 capsule (100 mg total) by mouth every 8 (eight) hours as needed for cough., Disp: 30 capsule, Rfl: 0 .  Calcium Carbonate-Vitamin D (CALTRATE 600+D) 600-400 MG-UNIT per tablet, Take by mouth., Disp: , Rfl:  .  docusate sodium (COLACE) 250 MG capsule, Take 250 mg by mouth daily., Disp: , Rfl:  .  doxycycline (VIBRA-TABS) 100 MG tablet, Take 1 tablet (100 mg total) by mouth 2 (two) times daily., Disp: 20 tablet, Rfl: 0 .  Hyoscyamine Sulfate 0.375 MG TBCR, Take 1 tablet (0.375 mg total) by mouth 2 (two) times daily as needed., Disp: 100 each, Rfl: 1 .  losartan-hydrochlorothiazide (HYZAAR) 100-12.5 MG tablet, TAKE 1/2 TABLET EVERY DAY, Disp: 45 tablet, Rfl: 1 .  MULTIPLE VITAMIN PO, Take by mouth., Disp: , Rfl:  .  Probiotic Product (PROBIOTIC ADVANCED PO), Take by mouth., Disp: , Rfl:  .  triamcinolone cream (KENALOG) 0.1 %, Apply 1 application topically 2 (two) times daily. If needed, Disp: 45 g, Rfl: 1 .  TURMERIC PO, Take by mouth., Disp: , Rfl:   Allergies  Allergen Reactions  . Nitrofurantoin Monohyd Macro     I personally reviewed active problem list, medication list, allergies, notes from last encounter, lab results with the patient/caregiver today.  ROS  Constitutional: Negative for fever or weight change.  Respiratory: Positive for improving cough and shortness of breath.   Cardiovascular: Negative for chest  pain or palpitations.  Gastrointestinal: Negative for abdominal pain, no bowel changes.  Musculoskeletal: Negative for gait problem or joint swelling.  Skin: Negative for rash.  Neurological: Negative for dizziness or headache.  No other specific complaints in a complete review of systems (except as listed in HPI above).  Objective  Vitals:   10/20/18 1340  BP: 128/76  Pulse: 80  Resp: 14  Temp: 98.3 F (36.8 C)  TempSrc: Oral    SpO2: 94%  Weight: 206 lb 11.2 oz (93.8 kg)  Height: 5\' 9"  (1.753 m)   Body mass index is 30.52 kg/m.  Nursing Note and Vital Signs reviewed.  Physical Exam  Constitutional: Patient appears well-developed and well-nourished. No distress.  HENT: Head: Normocephalic and atraumatic. Eyes: Conjunctivae and EOM are normal. No scleral icterus.   Neck: Normal range of motion. Neck supple. No JVD present. No thyromegaly present.  Cardiovascular: Normal rate, regular rhythm and normal heart sounds.  No murmur heard. No BLE edema. Pulmonary/Chest: Effort normal and breath sounds - RLL exhibits very mild rhonchi, otherwise clear. No respiratory distress. Musculoskeletal: Normal range of motion, no joint effusions. No gross deformities Neurological: Pt is alert and oriented to person, place, and time. No cranial nerve deficit. Coordination, balance, strength, speech and gait are normal.  Skin: Skin is warm and dry. No rash noted. No erythema.  Psychiatric: Patient has a normal mood and affect. behavior is normal. Judgment and thought content normal.  No results found for this or any previous visit (from the past 72 hour(s)).  Assessment & Plan  1. Shortness of breath 2. Abnormal chest x-ray 3. Cough SpO2 is stable today, improving course overall.  Advised to complete ABX.  If she does not hear from Pulmonology in 1 week, she will call our office to follow up.   -Red flags and when to present for emergency care or RTC including fever >101.28F, chest pain, shortness of breath, new/worsening/un-resolving symptoms, malaise/fatigue reviewed with patient at time of visit. Follow up and care instructions discussed and provided in AVS.

## 2018-10-28 ENCOUNTER — Encounter: Payer: Self-pay | Admitting: Pulmonary Disease

## 2018-10-28 ENCOUNTER — Ambulatory Visit (INDEPENDENT_AMBULATORY_CARE_PROVIDER_SITE_OTHER): Payer: Medicare HMO | Admitting: Pulmonary Disease

## 2018-10-28 VITALS — BP 142/70 | HR 57 | Ht 69.0 in | Wt 209.0 lb

## 2018-10-28 DIAGNOSIS — K219 Gastro-esophageal reflux disease without esophagitis: Secondary | ICD-10-CM | POA: Diagnosis not present

## 2018-10-28 DIAGNOSIS — R062 Wheezing: Secondary | ICD-10-CM

## 2018-10-28 DIAGNOSIS — J9801 Acute bronchospasm: Secondary | ICD-10-CM | POA: Diagnosis not present

## 2018-10-28 DIAGNOSIS — R918 Other nonspecific abnormal finding of lung field: Secondary | ICD-10-CM

## 2018-10-28 DIAGNOSIS — J683 Other acute and subacute respiratory conditions due to chemicals, gases, fumes and vapors: Secondary | ICD-10-CM | POA: Diagnosis not present

## 2018-10-28 DIAGNOSIS — J3089 Other allergic rhinitis: Secondary | ICD-10-CM

## 2018-10-28 MED ORDER — MONTELUKAST SODIUM 10 MG PO TABS: 10.0000 mg | ORAL_TABLET | Freq: Every day | ORAL | 1 refills | Status: DC

## 2018-10-28 MED ORDER — BUDESONIDE-FORMOTEROL FUMARATE 160-4.5 MCG/ACT IN AERO: 2.0000 | INHALATION_SPRAY | Freq: Two times a day (BID) | RESPIRATORY_TRACT | 12 refills | Status: DC

## 2018-10-28 MED ORDER — BUDESONIDE-FORMOTEROL FUMARATE 160-4.5 MCG/ACT IN AERO: 2.0000 | INHALATION_SPRAY | Freq: Two times a day (BID) | RESPIRATORY_TRACT | 1 refills | Status: DC

## 2018-10-28 NOTE — Progress Notes (Signed)
Patient seen in the office today and instructed on use of Symbicort 160.  Patient expressed understanding and demonstrated technique. 

## 2018-10-28 NOTE — Patient Instructions (Signed)
1) Singulair (montelukast) 1 tablet daily  2) Symbicort 160/4.5, 2 inhalations twice a day.  Hopefully you can come off of this inhaler once we get to stable.  3) we will schedule you for formal breathing test.  4) we will see you back in 4 to 6 weeks time.

## 2018-10-28 NOTE — Progress Notes (Signed)
Subjective:    Patient ID: Ruth Bishop, female    DOB: [birth year], 72 y.o.   MRN: [id number]  HPI Patient is a 72 year old African-American woman, lifelong never smoker who presents for evaluation management of cough and dyspnea after an episode of bronchitis.  The patient is kindly referred by Raelyn Ensign, [place].  Patient has noted significantly increased congestion particularly nasal congestion and then this leads to chest congestion.  She states that she coughs and that cough is productive of whitish sputum.  Initially it did look yellow to green but after antibiotics (doxycycline) she has noted improvement in this.  She notes that cold weather and noxious fumes aggravate her symptoms of dyspnea.  She has never been diagnosed with any pulmonary disease in the past.  She also notes that during this episode of congestion she has also had some issues with snoring and apneic episodes (witnessed by family member).  She has not had this issue previously.  She has not had nocturnal awakenings even with these issues.  Not had orthopnea or paroxysmal nocturnal dyspnea.  Does note significant heartburn and indigestion and at times refluxes and will noted on the back of her throat.  Currently she has had no fevers, chills or sweats.  He had a CT scan of the chest 24 September that showed multiple small lung nodules which will need follow-up.  Past medical history, surgical history and social history have been reviewed.  They are as noted.  As noted she is a lifelong never smoker.  She has not had occupational exposure in the past.  She is retired.  She has never had a military history.  She previously resided in [place] and [place].  She has not had exotic travel outside of the country.  No exposure to tuberculosis.   Review of Systems  Constitutional: Negative.   HENT: Negative.   Eyes: Negative.   Respiratory: Positive for cough and shortness of breath.   Cardiovascular: Negative.     Gastrointestinal:       Significant heartburn and indigestion.  Reflux symptoms.  Endocrine: Negative.   Genitourinary: Negative.   Musculoskeletal: Negative.   Skin: Negative.   Allergic/Immunologic: Positive for environmental allergies.  Neurological: Negative.   Hematological: Negative.   Psychiatric/Behavioral: Negative.   All other systems reviewed and are negative.      Objective:   Physical Exam  Constitutional: She is oriented to person, place, and time. She appears well-developed and well-nourished. She does not appear ill. No distress.  HENT:  Head: Normocephalic and atraumatic.  She has turbinate edema bilaterally.  With crusted nasal discharge.  Postnasal drip noted.  Coated tongue.  Eyes: Pupils are equal, round, and reactive to light. EOM are normal.  Neck: Neck supple. No JVD present. No tracheal deviation present. No thyromegaly present.  Cardiovascular: Normal rate, regular rhythm, normal heart sounds and intact distal pulses.  No murmur heard. Pulmonary/Chest: Effort normal. She has no decreased breath sounds. She has wheezes (Faint, diffuse.). She has no rhonchi. She has no rales. She exhibits no retraction.  Abdominal: Bowel sounds are normal. She exhibits no distension.  Musculoskeletal: Normal range of motion.       Right lower leg: Normal. She exhibits no edema.       Left lower leg: Normal. She exhibits no edema.  Lymphadenopathy:    She has no cervical adenopathy.  Neurological: She is alert and oriented to person, place, and time.  Focal deficit.  Skin: Skin is warm and  dry. No rash noted. No cyanosis. Nails show no clubbing.  Psychiatric: She has a normal mood and affect. Her behavior is normal.          Assessment & Plan:   1) Reactive airways dysfunction syndrome: She has likely developed issues with airways reactivity after an infection.  Though her spirometry showed no overt major airway obstruction she did have significantly decreased peak  flow and very decreased volumes may be related to small airways dysfunction.  We will give her a trial of Symbicort 160/4.5, 2 inhalations twice a day and montelukast 1 tablet daily.  Hopefully she will come off of this inhaler once she stabilizes.  People may exhibit these issues for up to 8 weeks after an infection.  We will obtain full pulmonary function studies to evaluate this issue.  2) Wheezing/bronchospasm: Patient was noted to have active bronchospasm today though her spirometry did not show obstruction.  Formal PFTs will be performed.  3) Multiple lung nodules: This will need to be followed with serial chest CTs.  The patient does exhibit some areas of some groundglass attenuation she is having significant reflux symptoms and chronic silent aspiration can cause these issues.  4) Gastroesophageal reflux disease: Patient was taught we discussed antireflux measures.  If her symptoms persist despite proper antireflux measures consider short course with PPI or H2 blocker.   5) Perennial allergic rhinitis: As noted above trial of montelukast.  6) Snoring: She has significant nasal turbinate edema and congestion, this will be reassessed once these symptoms subside.   We will see the patient in 4 to 6 weeks time she is to contact us prior to that time should any new difficulties arise.  You for allowing [place] to participate in this patient's care.

## 2018-11-24 ENCOUNTER — Ambulatory Visit: Payer: Self-pay

## 2018-11-28 ENCOUNTER — Ambulatory Visit: Payer: Self-pay | Admitting: Pulmonary Disease

## 2018-12-21 ENCOUNTER — Telehealth: Payer: Self-pay | Admitting: Family Medicine

## 2018-12-21 MED ORDER — FAMOTIDINE 20 MG PO TABS: 20.0000 mg | ORAL_TABLET | Freq: Two times a day (BID) | ORAL | 2 refills | Status: DC | PRN

## 2018-12-21 NOTE — Telephone Encounter (Signed)
Pt.notified

## 2018-12-21 NOTE — Telephone Encounter (Signed)
Copied from [place] [id number]. Topic: Quick Communication - See Telephone Encounter >> Dec 21, 2018 11:04 AM Vernona Rieger wrote: CRM for notification. See Telephone encounter for: 12/21/18. Patient state she used to take omeprazole (PRILOSEC) 40 MG capsule and then was switched to Zantac. She said they have taken that off the shelves. She would like to know would it be okay to take Pepcid. She said the pharmacy recommended this. Please Advise.

## 2018-12-21 NOTE — Telephone Encounter (Signed)
New Rx sent, okay for Pepcid

## 2019-01-25 ENCOUNTER — Telehealth: Payer: Self-pay | Admitting: Family Medicine

## 2019-01-25 DIAGNOSIS — R911 Solitary pulmonary nodule: Secondary | ICD-10-CM

## 2019-01-25 NOTE — Telephone Encounter (Signed)
From the March 2019 chest CT: 2. Stable sub solid lesion medially at the left apex from neck CT 03/05/2017. Additional small subpleural nodules are present bilaterally, stable over 6 months and likely benign. An additional follow-up study without contrast 1 year from now suggested to better assess stability of the left apical lesion.  She had a CT scan in September 2019  I'm forwarding this to our new pulmonary nodule clinic for management and follow-up to determine if next chest CT is appropriate to get in March 2020 or Sept 2020 and to follow ongoing

## 2019-01-27 ENCOUNTER — Other Ambulatory Visit: Payer: Self-pay | Admitting: Oncology

## 2019-01-27 DIAGNOSIS — R911 Solitary pulmonary nodule: Secondary | ICD-10-CM

## 2019-01-27 NOTE — Progress Notes (Unsigned)
.   Pulmonary Nodule Clinic Telephone Note  Received referral from PCP, Dr. Sanda Klein   Per most recent guidelines and recommendations from [employer] (2017), this patient requires a CT scan without contrast, 6-12 months from last scan and follow-up visit in the pulmonary nodule clinic a few days later.    I have personally reviewed all patient's previous imaging. Last CT scan completed on 08/30/18 revealed a part solid nodule within the medial aspect of the left upper lobe measuring 1.3 cm with a central solid component of 5 mm.  Previous imaging from 02/17/18 and 08/18/17 revealed stable sub solid left upper lobe nodule measuring 5 mm and 4 mm. Per Fleischner guidelines (2017), CT scan without contrast is recommended in 12 months and if stable repeat CT scan at 18 to 24 months (from first scan) to ensure stability in high risk patients.  High risk factors include: History of heavy smoking, exposure to asbestos, radium or uranium, personal family history of lung cancer, older age, sex (females greater than males), race (black and native [place] greater than weight), marginal speculation, upper lobe location, multiplicity (less than 5 nodules increases risk for malignancy) and emphysema and/or pulmonary fibrosis.   This recommendation follows the consensus statement: Guidelines for Management of Incidental Pulmonary Nodules Detected on CT Images: From the Fleischner Society 2017; Radiology 2017; 284:228-243.    I have placed order for CT scan without contrast to be completed approximately 12 months from previous CT scan.  I would like her to see me in our Pulmonary Nodule Clinic after her CT scan on scheduled clinic day (Friday Morning).   Patient verbalizes understanding of program and agrees to appt and ct scan in September 2020.   Scheduling notified.   Faythe Casa, NP 01/18/2019 2:22 PM

## 2019-03-28 ENCOUNTER — Encounter: Payer: Self-pay | Admitting: Family Medicine

## 2019-03-28 ENCOUNTER — Ambulatory Visit (INDEPENDENT_AMBULATORY_CARE_PROVIDER_SITE_OTHER): Payer: Medicare HMO | Admitting: Family Medicine

## 2019-03-28 ENCOUNTER — Other Ambulatory Visit: Payer: Self-pay

## 2019-03-28 DIAGNOSIS — Z7189 Other specified counseling: Secondary | ICD-10-CM

## 2019-03-28 NOTE — Progress Notes (Signed)
There were no vitals taken for this visit.   Subjective:    Patient ID: Ruth Bishop, female    DOB: 1946-09-05, 73 y.o.   MRN: [id number]  HPI: Ruth Bishop is a 73 y.o. female  Chief Complaint  Patient presents with  . COVID concerns    pt is not experiencing any symptoms but is concerned as she is high risk and has travel plans    HPI Virtual Visit via Telephone/Video Note   I connected with the patient via:  Doximity video I verified that I am speaking with the correct person using two identifiers.  Call started: 1:42 pm Call terminated: 2:07 pm Total length of call: 25 minutes   I discussed the limitations, risks, and privacy concerns of performing an evaluation and management service by telephone and the availability of in-person appointments. I explained that he/she may be responsible for charges related to this service. The patient expressed understanding and agreed to proceed.  Provider location: home, upstairs office with door closed, earphones/headset on Patient location: home Additional participants: no one  She says she is fine and does not have any symptoms She is hoping to travel back to [place] in the middle of May and wants to make sure she is okay to travel No fever, SHOB, cough, diarrhea, sore throat No travel to endemic area No known exposures to anyone with COVID-19 in the last two weeks  From our hospital system's menu, I read this to her...   Coronavirus (COVID-19) Are you at risk?  Are you at risk for the Coronavirus (COVID-19)?  To be considered HIGH RISK for Coronavirus (COVID-19), you have to meet the following criteria:  . Traveled to [place], [place], [place], [place] or [place]; or in the [place] to [place], [place], [place], or [place]; and have fever, cough, and shortness of breath within the last 2 weeks of travel OR . Been in close contact with a person diagnosed with COVID-19 within the last 2 weeks and have  fever, cough, and shortness of breath . IF YOU DO NOT MEET THESE CRITERIA, YOU ARE CONSIDERED LOW RISK FOR COVID-19.  What to do if you are HIGH RISK for COVID-19?  Marland Kitchen If you are having a medical emergency, call 911. . Seek medical care right away. Before you go to a doctor's office, urgent care or emergency department, call ahead and tell them about your recent travel, contact with someone diagnosed with COVID-19, and your symptoms. You should receive instructions from your physician's office regarding next steps of care.  . When you arrive at healthcare provider, tell the healthcare staff immediately you have returned from visiting [place], [place], [place], [place] or [place]; or traveled in the [place] to [place], [place], [place], or [place]; in the last two weeks or you have been in close contact with a person diagnosed with COVID-19 in the last 2 weeks.   . Tell the health care staff about your symptoms: fever, cough and shortness of breath. . After you have been seen by a medical provider, you will be either: o Tested for (COVID-19) and discharged home on quarantine except to seek medical care if symptoms worsen, and asked to  - Stay home and avoid contact with others until you get your results (4-5 days)  - Avoid travel on public transportation if possible (such as bus, train, or airplane) or o Sent to the Emergency Department by EMS for evaluation, COVID-19 testing, and possible admission  depending on your condition and test results.  What to do if you are LOW RISK for COVID-19?  Reduce your risk of any infection by using the same precautions used for avoiding the common cold or flu:  Marland Kitchen Wash your hands often with soap and warm water for at least 20 seconds.  If soap and water are not readily available, use an alcohol-based hand sanitizer with at least 60% alcohol.  . If coughing or sneezing, cover your mouth and nose by coughing or sneezing into the elbow areas of your shirt  or coat, into a tissue or into your sleeve (not your hands). . Avoid shaking hands with others and consider head nods or verbal greetings only. . Avoid touching your eyes, nose, or mouth with unwashed hands.  . Avoid close contact with people who are sick. . Avoid places or events with large numbers of people in one location, like concerts or sporting events. . Carefully consider travel plans you have or are making. . If you are planning any travel outside or inside the [place], visit the CDC's Travelers' Health webpage for the latest health notices. . If you have some symptoms but not all symptoms, continue to monitor at home and seek medical attention if your symptoms worsen. . If you are having a medical emergency, call 911.   [place] / e-Visit: [email or web address]         [facility]: [facility]: [phone]                   [facility]: [phone]   She asked questions about travel, homemade masks   Fall Risk  03/28/2019 10/20/2018 10/13/2018 08/05/2018 02/04/2018  Falls in the past year? 0 1 1 No No  Number falls in past yr: - 0 0 - -  Injury with Fall? - 0 0 - -  Risk for fall due to : - - - Impaired vision;History of fall(s) -  Risk for fall due to: Comment - - - wears eyeglasses -    Relevant past medical, surgical, family and social history reviewed Past Medical History:  Diagnosis Date  . Arthritis   . Diastolic dysfunction 8/41/3244  . GERD (gastroesophageal reflux disease)   . Heart murmur   . Hypertension   . Incidental lung nodule, > 84mm and < 53mm 08/04/2017   4 mm ground glass nodule LLL incidentally noted on CT scan Nov 2016   Past Surgical History:  Procedure Laterality Date  . ABDOMINAL HYSTERECTOMY    . COLONOSCOPY WITH PROPOFOL N/A 10/01/2015   Procedure: COLONOSCOPY WITH PROPOFOL;  Surgeon: Lollie Sails, MD;  Location[facility] ENDOSCOPY;  Service: Endoscopy;  Laterality: N/A;  . ESOPHAGOGASTRODUODENOSCOPY (EGD) WITH PROPOFOL N/A 10/01/2015   Procedure: ESOPHAGOGASTRODUODENOSCOPY (EGD) WITH PROPOFOL;  Surgeon: Lollie Sails, MD;  Location: [facility] ENDOSCOPY;  Service: Endoscopy;  Laterality: N/A;   Family History  Problem Relation Age of Onset  . Heart disease Mother   . Diabetes Mother   . Aneurysm Mother   . Heart disease Father   . Heart attack Father   . Heart disease Sister   . Diabetes Sister   . Pneumonia Sister   . Heart disease Brother   . Diabetes Brother   . Diabetes Sister   . Heart disease Sister   . Congestive Heart Failure Sister   . Breast cancer Neg Hx    Social History  Tobacco Use  . Smoking status: Never Smoker  . Smokeless tobacco: Never Used  . Tobacco comment: smoking cessation materials not required  Substance Use Topics  . Alcohol use: Not Currently    Alcohol/week: 0.0 standard drinks  . Drug use: No     [facility] Visit from 03/28/2019 [facility]  AUDIT-C Score  1      Interim medical history since last visit reviewed. Allergies and medications reviewed  Review of Systems Per HPI unless specifically indicated above     Objective:    There were no vitals taken for this visit.  Wt Readings from Last 3 Encounters:  10/28/18 209 lb (94.8 kg)  10/20/18 206 lb 11.2 oz (93.8 kg)  10/13/18 206 lb 3.2 oz (93.5 kg)    Physical Exam Constitutional:      Appearance: Normal appearance.  Eyes:     Extraocular Movements: Extraocular movements intact.     Right eye: Normal extraocular motion.     Left eye: Normal extraocular motion.  Pulmonary:     Effort: No tachypnea or respiratory distress.  Skin:    Coloration: Skin is not jaundiced or pale.  Neurological:     Mental Status: She is alert.     Cranial Nerves: No dysarthria.  Psychiatric:        Mood and Affect: Mood is not anxious or depressed.        Speech:  Speech is not rapid and pressured, delayed or slurred.       Assessment & Plan:   Problem List Items Addressed This Visit    None    Visit Diagnoses    Educated About Covid-19 Virus Infection    -  Primary   lengthy discussion about high risk of complications vs high risk of acquiring COVID-19; safeguard, hand washing, masks, testing req; stay at home encouraged      Follow up plan: No follow-ups on file.

## 2019-04-17 ENCOUNTER — Other Ambulatory Visit: Payer: Self-pay | Admitting: Nurse Practitioner

## 2019-04-18 NOTE — Telephone Encounter (Signed)
Medication approved but patient needs routine appointment within the next month

## 2019-04-18 NOTE — Telephone Encounter (Signed)
Spoke with patient and informed her that the requested medication was sent in to her mail order pharmacy.  Also informed patient that she needed to schedule a follow up appointment within a month either in person or virtual.  Patient stated that she did not want to schedule a appointment at this time.  I informed patient that if she needs another refill she would have to make an appointment.  Patient verbalized understanding.

## 2019-04-25 DIAGNOSIS — Z03818 Encounter for observation for suspected exposure to other biological agents ruled out: Secondary | ICD-10-CM | POA: Diagnosis not present

## 2019-04-25 DIAGNOSIS — Z20828 Contact with and (suspected) exposure to other viral communicable diseases: Secondary | ICD-10-CM | POA: Diagnosis not present

## 2019-05-03 ENCOUNTER — Ambulatory Visit (INDEPENDENT_AMBULATORY_CARE_PROVIDER_SITE_OTHER): Payer: Medicare HMO | Admitting: Nurse Practitioner

## 2019-05-03 ENCOUNTER — Other Ambulatory Visit: Payer: Self-pay

## 2019-05-03 ENCOUNTER — Encounter: Payer: Self-pay | Admitting: Nurse Practitioner

## 2019-05-03 VITALS — BP 126/64 | HR 85 | Temp 97.8°F | Resp 12 | Ht 69.0 in | Wt 207.8 lb

## 2019-05-03 DIAGNOSIS — H00012 Hordeolum externum right lower eyelid: Secondary | ICD-10-CM

## 2019-05-03 DIAGNOSIS — H00014 Hordeolum externum left upper eyelid: Secondary | ICD-10-CM

## 2019-05-03 DIAGNOSIS — J302 Other seasonal allergic rhinitis: Secondary | ICD-10-CM | POA: Diagnosis not present

## 2019-05-03 MED ORDER — BACITRACIN-POLYMYXIN B 500-10000 UNIT/GM OP OINT: 1.0000 "application " | TOPICAL_OINTMENT | Freq: Four times a day (QID) | OPHTHALMIC | 1 refills | Status: DC

## 2019-05-03 MED ORDER — CETIRIZINE HCL 10 MG PO CAPS: 10.0000 mg | ORAL_CAPSULE | Freq: Every day | ORAL | 0 refills | Status: DC

## 2019-05-03 NOTE — Progress Notes (Signed)
Name: Ruth Bishop   MRN: [id number]    DOB: June 25, 1946   Date:05/03/2019       Progress Note  Subjective  Chief Complaint  Chief Complaint  Patient presents with  . Eye Problem    HPI  Patient has stye to left upper eye and right lower eye over a week ago. Has been using warm, moist compresses without any relief. Had used OTC stye relief and eye patch without relief. Denies vision changes or eye pain.  Sometime wears contacts Does not wear make-up.    PHQ2/9: Depression screen [facility] 2/9 05/03/2019 10/20/2018 10/13/2018 08/05/2018 02/04/2018  Decreased Interest 0 0 0 0 0  Down, Depressed, Hopeless 1 0 0 0 0  PHQ - 2 Score 1 0 0 0 0  Altered sleeping 0 0 0 0 -  Tired, decreased energy 0 0 0 0 -  Change in appetite 0 0 0 0 -  Feeling bad or failure about yourself  0 0 0 0 -  Trouble concentrating 0 0 0 0 -  Moving slowly or fidgety/restless 0 0 0 0 -  Suicidal thoughts 0 0 0 0 -  PHQ-9 Score 1 0 0 0 -  Difficult doing work/chores Not difficult at all Not difficult at all Not difficult at all Not difficult at all -     PHQ reviewed. Negative  Patient Active Problem List   Diagnosis Date Noted  . Shortness of breath 10/18/2018  . Abnormal chest x-ray 10/18/2018  . Preventative health care 10/13/2018  . Obese 10/13/2018  . Elevated C-reactive protein (CRP) 02/14/2018  . Elevated antinuclear antibody (ANA) level 02/14/2018  . First degree AV block 09/17/2017  . Left atrial enlargement 09/17/2017  . Incidental lung nodule, > 33mm and < 4mm 08/04/2017  . Prediabetes 11/11/2016  . Aortic regurgitation 06/17/2016  . Mitral regurgitation 06/17/2016  . Diastolic dysfunction 55/73/2202  . Hormone replacement therapy 05/26/2016  . Elevated alkaline phosphatase level 06/13/2015  . Osteoarthritis of both knees 06/13/2015  . 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 Chronic pain 05/24/2015  . Chronic constipation 05/24/2015  . Gastro-esophageal reflux disease without  esophagitis 05/24/2015  . Cardiac murmur 05/24/2015  . Hypertension goal BP (blood pressure) < 150/90 05/24/2015  . 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  . Post menopausal syndrome 05/24/2015  . Skin-picking disorder 05/24/2015    Past Medical History:  Diagnosis Date  . Arthritis   . Diastolic dysfunction 5/42/7062  . GERD (gastroesophageal reflux disease)   . Heart murmur   . Hypertension   . Incidental lung nodule, > 78mm and < 11mm 08/04/2017   4 mm ground glass nodule LLL incidentally noted on CT scan Nov 2016    Past Surgical History:  Procedure Laterality Date  . ABDOMINAL HYSTERECTOMY    . COLONOSCOPY WITH PROPOFOL N/A 10/01/2015   Procedure: COLONOSCOPY WITH PROPOFOL;  Surgeon: Lollie Sails, MD;  Location: [facility] ENDOSCOPY;  Service: Endoscopy;  Laterality: N/A;  . ESOPHAGOGASTRODUODENOSCOPY (EGD) WITH PROPOFOL N/A 10/01/2015   Procedure: ESOPHAGOGASTRODUODENOSCOPY (EGD) WITH PROPOFOL;  Surgeon: Lollie Sails, MD;  Location[facility] ENDOSCOPY;  Service: Endoscopy;  Laterality: N/A;    Social History   Tobacco Use  . Smoking status: Never Smoker  . Smokeless tobacco: Never Used  . Tobacco comment: smoking cessation materials not required  Substance Use Topics  . Alcohol use: Not Currently    Alcohol/week: 0.0 standard drinks     Current Outpatient Medications:  .  aspirin EC 81 MG  tablet, Take 81 mg by mouth daily., Disp: , Rfl:  .  Calcium Carbonate-Vitamin D (CALTRATE 600+D) 600-400 MG-UNIT per tablet, Take 1 tablet by mouth daily. , Disp: , Rfl:  .  docusate sodium (COLACE) 250 MG capsule, Take 250 mg by mouth daily., Disp: , Rfl:  .  famotidine (PEPCID) 20 MG tablet, Take 1 tablet (20 mg total) by mouth 2 (two) times daily as needed for heartburn or indigestion., Disp: 60 tablet, Rfl: 2 .  Hyoscyamine Sulfate 0.375 MG TBCR, Take 1 tablet (0.375 mg total) by mouth 2 (two) times daily as needed., Disp: 100 each, Rfl: 1 .   losartan-hydrochlorothiazide (HYZAAR) 100-12.5 MG tablet, TAKE 1/2 TABLET EVERY DAY, Disp: 45 tablet, Rfl: 1 .  MULTIPLE VITAMIN PO, Take 1 tablet by mouth daily. , Disp: , Rfl:  .  Probiotic Product (PROBIOTIC ADVANCED PO), Take 1 tablet by mouth daily. , Disp: , Rfl:  .  triamcinolone cream (KENALOG) 0.1 %, Apply 1 application topically 2 (two) times daily. If needed, Disp: 45 g, Rfl: 1 .  TURMERIC PO, Take 1 tablet by mouth daily. , Disp: , Rfl:  .  benzonatate (TESSALON PERLES) 100 MG capsule, Take 1 capsule (100 mg total) by mouth every 8 (eight) hours as needed for cough. (Patient not taking: Reported on 03/28/2019), Disp: 30 capsule, Rfl: 0 .  budesonide-formoterol (SYMBICORT) 160-4.5 MCG/ACT inhaler, Inhale 2 puffs into the lungs 2 (two) times daily. (Patient not taking: Reported on 03/28/2019), Disp: 6 g, Rfl: 1 .  budesonide-formoterol (SYMBICORT) 160-4.5 MCG/ACT inhaler, Inhale 2 puffs into the lungs 2 (two) times daily. (Patient not taking: Reported on 03/28/2019), Disp: 1 Inhaler, Rfl: 12 .  montelukast (SINGULAIR) 10 MG tablet, Take 1 tablet (10 mg total) by mouth daily. (Patient not taking: Reported on 03/28/2019), Disp: 30 tablet, Rfl: 1  Allergies  Allergen Reactions  . Nitrofurantoin Monohyd Macro     ROS    No other specific complaints in a complete review of systems (except as listed in HPI above).  Objective  Vitals:   05/03/19 1146  BP: 126/64  Pulse: 85  Resp: 12  Temp: 97.8 F (36.6 C)  TempSrc: Oral  SpO2: 99%  Weight: 207 lb 12.8 oz (94.3 kg)  Height: 5\' 9"  (1.753 m)     Body mass index is 30.69 kg/m.  Nursing Note and Vital Signs reviewed.  Physical Exam Constitutional:      Appearance: Normal appearance. She is well-developed.  HENT:     Head: Normocephalic and atraumatic.     Right Ear: Hearing normal.     Left Ear: Hearing normal.  Eyes:     General: Vision grossly intact.        Right eye: Hordeolum present. No foreign body or discharge.         Left eye: Hordeolum present.No foreign body or discharge.     Extraocular Movements:     Right eye: Normal extraocular motion.     Left eye: Normal extraocular motion.     Conjunctiva/sclera: Conjunctivae normal.  Cardiovascular:     Rate and Rhythm: Normal rate.  Pulmonary:     Effort: Pulmonary effort is normal.  Musculoskeletal: Normal range of motion.  Neurological:     Mental Status: She is alert and oriented to person, place, and time.  Psychiatric:        Speech: Speech normal.        Behavior: Behavior normal. Behavior is cooperative.  Thought Content: Thought content normal.        Judgment: Judgment normal.        No results found for this or any previous visit (from the past 48 hour(s)).  Assessment & Plan  1. Hordeolum externum of left upper eyelid - bacitracin-polymyxin b (POLYSPORIN) ophthalmic ointment; Place 1 application into both eyes 4 (four) times daily. apply to eye every 6 hours while awake for 1-2 weeks until completely resolved.  Dispense: 3.5 g; Refill: 1  2. Hordeolum externum of right lower eyelid - bacitracin-polymyxin b (POLYSPORIN) ophthalmic ointment; Place 1 application into both eyes 4 (four) times daily. apply to eye every 6 hours while awake for 1-2 weeks until completely resolved.  Dispense: 3.5 g; Refill: 1  3. Seasonal allergies - Cetirizine HCl 10 MG CAPS; Take 1 capsule (10 mg total) by mouth daily.  Dispense: 30 capsule; Refill: 0  - please use antibiotic ointment on effected eye(s) for at least one week if symptoms resolve stop then, at maximum 2 weeks. If your symptoms do not resolve please call your eye specialist.

## 2019-05-03 NOTE — Patient Instructions (Addendum)
- please use antibiotic ointment on effected eye(s) for at least one week if symptoms resolve stop then, at maximum 2 weeks. If your symptoms do not resolve please call your eye specialist.   Stye  A stye, also known as a hordeolum, is a bump that forms on an eyelid. It may look like a pimple next to the eyelash. A stye can form inside the eyelid (internal stye) or outside the eyelid (external stye). A stye can cause redness, swelling, and pain on the eyelid. Styes are very common. Anyone can get them at any age. They usually occur in just one eye, but you may have more than one in either eye. What are the causes? A stye is caused by an infection. The infection is almost always caused by bacteria called Staphylococcus aureus. This is a common type of bacteria that lives on the skin. An internal stye may result from an infected oil-producing gland inside the eyelid. An external stye may be caused by an infection at the base of the eyelash (hair follicle). What increases the risk? You are more likely to develop a stye if:  You have had a stye before.  You have any of these conditions: ? Diabetes. ? Red, itchy, inflamed eyelids (blepharitis). ? A skin condition such as seborrheic dermatitis or rosacea. ? High fat levels in your blood (lipids). What are the signs or symptoms? The most common symptom of a stye is eyelid pain. Internal styes are more painful than external styes. Other symptoms may include:  Painful swelling of your eyelid.  A scratchy feeling in your eye.  Tearing and redness of your eye.  Pus draining from the stye. How is this diagnosed? Your health care provider may be able to diagnose a stye just by examining your eye. The health care provider may also check to make sure:  You do not have a fever or other signs of a more serious infection.  The infection has not spread to other parts of your eye or areas around your eye. How is this treated? Most styes will clear up  in a few days without treatment or with warm compresses applied to the area. You may need to use antibiotic drops or ointment to treat an infection. In some cases, if your stye does not heal with routine treatment, your health care provider may drain pus from the stye using a thin blade or needle. This may be done if the stye is large, causing a lot of pain, or affecting your vision. Follow these instructions at home:  Take over-the-counter and prescription medicines only as told by your health care provider. This includes eye drops or ointments.  If you were prescribed an antibiotic medicine, apply or use it as told by your health care provider. Do not stop using the antibiotic even if your condition improves.  Apply a warm, wet cloth (warm compress) to your eye for 5-10 minutes, 4 times a day.  Clean the affected eyelid as directed by your health care provider.  Do not wear contact lenses or eye makeup until your stye has healed.  Do not try to pop or drain the stye.  Do not rub your eye. Contact a health care provider if:  You have chills or a fever.  Your stye does not go away after several days.  Your stye affects your vision.  Your eyeball becomes swollen, red, or painful. Get help right away if:  You have pain when moving your eye around. Summary  A stye is a bump that forms on an eyelid. It may look like a pimple next to the eyelash.  A stye can form inside the eyelid (internal stye) or outside the eyelid (external stye). A stye can cause redness, swelling, and pain on the eyelid.  Your health care provider may be able to diagnose a stye just by examining your eye.  Apply a warm, wet cloth (warm compress) to your eye for 5-10 minutes, 4 times a day. This information is not intended to replace advice given to you by your health care provider. Make sure you discuss any questions you have with your health care provider. Document Released: 09/02/2005 Document Revised:  08/05/2017 Document Reviewed: 08/05/2017 Elsevier Interactive Patient Education  2019 [employer].

## 2019-06-27 DIAGNOSIS — Z0100 Encounter for examination of eyes and vision without abnormal findings: Secondary | ICD-10-CM | POA: Diagnosis not present

## 2019-06-28 ENCOUNTER — Other Ambulatory Visit: Payer: Self-pay

## 2019-06-28 ENCOUNTER — Emergency Department
Admission: EM | Admit: 2019-06-28 | Discharge: 2019-06-28 | Disposition: A | Discharge status: Home / Self Care | Payer: Medicare HMO | Attending: Emergency Medicine | Admitting: Emergency Medicine

## 2019-06-28 ENCOUNTER — Emergency Department: Payer: Medicare HMO

## 2019-06-28 ENCOUNTER — Ambulatory Visit: Payer: Self-pay | Admitting: *Deleted

## 2019-06-28 DIAGNOSIS — R079 Chest pain, unspecified: Secondary | ICD-10-CM | POA: Insufficient documentation

## 2019-06-28 DIAGNOSIS — Z5321 Procedure and treatment not carried out due to patient leaving prior to being seen by health care provider: Secondary | ICD-10-CM | POA: Diagnosis not present

## 2019-06-28 DIAGNOSIS — R0789 Other chest pain: Secondary | ICD-10-CM | POA: Diagnosis not present

## 2019-06-28 LAB — CBC
HCT: 41.6 % (ref 36.0–46.0)
Hemoglobin: 13.2 g/dL (ref 12.0–15.0)
MCH: 26.4 pg (ref 26.0–34.0)
MCHC: 31.7 g/dL (ref 30.0–36.0)
MCV: 83.2 fL (ref 80.0–100.0)
Platelets: 235 10*3/uL (ref 150–400)
RBC: 5 MIL/uL (ref 3.87–5.11)
RDW: 15.4 % (ref 11.5–15.5)
WBC: 6.2 10*3/uL (ref 4.0–10.5)
nRBC: 0 % (ref 0.0–0.2)

## 2019-06-28 LAB — BASIC METABOLIC PANEL
Anion gap: 8 (ref 5–15)
BUN: 16 mg/dL (ref 8–23)
CO2: 26 mmol/L (ref 22–32)
Calcium: 8.9 mg/dL (ref 8.9–10.3)
Chloride: 105 mmol/L (ref 98–111)
Creatinine, Ser: 0.89 mg/dL (ref 0.44–1.00)
GFR calc Af Amer: >60 mL/min (ref >60)
GFR calc non Af Amer: >60 mL/min (ref >60)
Glucose, Bld: 120 mg/dL — ABNORMAL HIGH (ref 70–99)
Potassium: 3.7 mmol/L (ref 3.5–5.1)
Sodium: 139 mmol/L (ref 135–145)

## 2019-06-28 LAB — TROPONIN I (HIGH SENSITIVITY): Troponin I (High Sensitivity): 5 ng/L (ref <18)

## 2019-06-28 MED ORDER — SODIUM CHLORIDE 0.9% FLUSH: 3.0000 mL | Freq: Once | INTRAVENOUS | Status: DC

## 2019-06-28 NOTE — Telephone Encounter (Signed)
Patient to go to ER; reviewed note- appears patient has gone.

## 2019-06-28 NOTE — Telephone Encounter (Signed)
Pt called with complaints of chest pressure; she says that during the night she felt like her heart was fluttering, and she had chest pressure; the pressure continues this morning; she describes it as "putting your fist in my chest"; the pt says that she was previously diagnosed with inflammation of her chest wall per rheumatology (costochondritis); the pt says that she feels like the fluttering is making her have the the pressure; the pt says that she is to have an EKG yearly; recomenedations made per nurse; spoke with Katharine Look at [facility], and she states that there are no available appointments in the office today, and the pt should proceed to the ED; pt given recommendations per the office, but she would like to be seen in the office; she can be contacted at [phone]; will route to office for final disposition.  Reason for Disposition . Age > 60 years (Exception: brief heart beat symptoms that went away and now feels well)  Answer Assessment - Initial Assessment Questions 1. DESCRIPTION: "Please describe your heart rate or heart beat that you are having" (e.g., fast/slow, regular/irregular, skipped or extra beats, "palpitations")     fluttering 2. ONSET: "When did it start?" (Minutes, hours or days)      06/28/2019 at 0300 3. DURATION: "How long does it last" (e.g., seconds, minutes, hours)     hours 4. PATTERN "Does it come and go, or has it been constant since it started?"  "Does it get worse with exertion?"   "Are you feeling it now?"     No; still having chest pressure 5. TAP: "Using your hand, can you tap out what you are feeling on a chair or table in front of you, so that I can hear?" (Note: not all patients can do this)       Pt can not complete task 6. HEART RATE: "Can you tell me your heart rate?" "How many beats in 15 seconds?"  (Note: not all patients can do this)        Pt can not complete task 7. RECURRENT SYMPTOM: "Have you ever had this before?" If so, ask: "When was the last  time?" and "What happened that time?"      Yes; seen by rheumatology 8. CAUSE: "What do you think is causing the palpitations?"     No sure 9. CARDIAC HISTORY: "Do you have any history of heart disease?" (e.g., heart attack, angina, bypass surgery, angioplasty, arrhythmia)      Mitral regurg 10. OTHER SYMPTOMS: "Do you have any other symptoms?" (e.g., dizziness, chest pain, sweating, difficulty breathing)      Chest pressure 11. PREGNANCY: "Is there any chance you are pregnant?" "When was your last menstrual period?"      no  Protocols used: HEART RATE AND HEARTBEAT QUESTIONS-A-AH

## 2019-06-28 NOTE — ED Notes (Signed)
Sent green and purple tubes to lab. 

## 2019-06-28 NOTE — ED Triage Notes (Signed)
Pt states she felt like her heart was "fluttering" this morning and since having chest pressure. Denies nausea or SOB.

## 2019-06-29 ENCOUNTER — Telehealth: Payer: Self-pay | Admitting: Emergency Medicine

## 2019-06-29 NOTE — Telephone Encounter (Signed)
Called patient due to lwot to inquire about condition and follow up plans. Says she still feels like bad case of indigestion.  Also feels like heart beating differently.  She left due to long wait.  I told her that she really needs to be seen by md.  She says she will definitely call her pcp in am.  She also agrees to return for any worsening symptoms or if she decides to be seen now.

## 2019-07-03 ENCOUNTER — Ambulatory Visit (INDEPENDENT_AMBULATORY_CARE_PROVIDER_SITE_OTHER): Payer: Medicare HMO | Admitting: Nurse Practitioner

## 2019-07-03 ENCOUNTER — Encounter: Payer: Self-pay | Admitting: Nurse Practitioner

## 2019-07-03 ENCOUNTER — Other Ambulatory Visit: Payer: Self-pay

## 2019-07-03 VITALS — BP 122/76 | HR 72 | Temp 96.6°F | Resp 14 | Ht 69.0 in | Wt 209.5 lb

## 2019-07-03 DIAGNOSIS — R311 Benign essential microscopic hematuria: Secondary | ICD-10-CM | POA: Diagnosis not present

## 2019-07-03 DIAGNOSIS — R0789 Other chest pain: Secondary | ICD-10-CM | POA: Diagnosis not present

## 2019-07-03 DIAGNOSIS — R350 Frequency of micturition: Secondary | ICD-10-CM | POA: Diagnosis not present

## 2019-07-03 DIAGNOSIS — J683 Other acute and subacute respiratory conditions due to chemicals, gases, fumes and vapors: Secondary | ICD-10-CM

## 2019-07-03 LAB — POCT URINALYSIS DIPSTICK
Bilirubin, UA: NEGATIVE
Glucose, UA: NEGATIVE
Ketones, UA: NEGATIVE
Leukocytes, UA: NEGATIVE
Nitrite, UA: NEGATIVE
Protein, UA: NEGATIVE
Spec Grav, UA: 1.015 (ref 1.010–1.025)
Urobilinogen, UA: NEGATIVE E.U./dL — AB
pH, UA: 5 (ref 5.0–8.0)

## 2019-07-03 MED ORDER — PANTOPRAZOLE SODIUM 40 MG PO TBEC: 40.0000 mg | DELAYED_RELEASE_TABLET | Freq: Every day | ORAL | 1 refills | Status: DC | PRN

## 2019-07-03 NOTE — Progress Notes (Signed)
Name: Ruth Bishop   MRN: [id number]    DOB: 1946/08/15   Date:07/03/2019       Progress Note  Subjective  Chief Complaint  Chief Complaint  Patient presents with  . Follow-up  . Urinary Tract Infection    Urination frequency, itching, onset 2-3 days ago.    HPI  Patient presents for ER follow-up however she left without being seen after triage and blood draw. She initially went for central upper chest pressure and heart fluttering sensation on 06/28/2019. States she has lots of belching so she took an acid-reflux pill without relief. States when she tried to go to sleep she felt her heart racing for a bit. States heart racing lasted just for Tuesday night and has resolved by the time she went to the ER but chest pressure was still there. Presently still having chest pressure but has improved some. Pain is achy to touch and worse with deep inspiration and movement. No heavy lifting.   Denies shortness of breath, dizziness, lightheadedness, nausea, vomiting, radiation down arms or jaw.    Patient has history for first degree block, controlled hypertension, aortic and mitral regurgitation and gerd.  EKG, chest x-ray and labs reviewed- nothing acutely concerning. EKG  Patient has see Dr. Rockey Situ, cardiologist on 02/14/2018 to establish care and follow-up on chest pressure symptoms at that time as well.  CT scan 02/07/2018 No coronary calcifications, no aortic atherosclerosis Echo 10/2017  Left ventricle: The cavity size was normal. Systolic function wasnormal. The estimated ejection fraction was in the range of 60%to 65%. Wall motion was normal; there were no regional wall motion abnormalities. Doppler parameters are consistent withabnormal left ventricular relaxation (grade 1 diastolicdysfunction). - Aortic valve: There was mild regurgitation. - Left atrium: The atrium was normal in size. - Right ventricle: Systolic function was normal. - Pulmonary arteries: Systolic pressure was within  the normal range.  Patient additionally notes today that she is having urinary frequency that started 2-3 days ago also notes that she has been drinking a lot more water and wants her urine checked.    PHQ2/9: Depression screen [facility] 2/9 07/03/2019 05/03/2019 10/20/2018 10/13/2018 08/05/2018  Decreased Interest 0 0 0 0 0  Down, Depressed, Hopeless 0 1 0 0 0  PHQ - 2 Score 0 1 0 0 0  Altered sleeping 0 0 0 0 0  Tired, decreased energy 0 0 0 0 0  Change in appetite 0 0 0 0 0  Feeling bad or failure about yourself  0 0 0 0 0  Trouble concentrating 0 0 0 0 0  Moving slowly or fidgety/restless 0 0 0 0 0  Suicidal thoughts 0 0 0 0 0  PHQ-9 Score 0 1 0 0 0  Difficult doing work/chores Not difficult at all Not difficult at all Not difficult at all Not difficult at all Not difficult at all     PHQ reviewed. Negative  Patient Active Problem List   Diagnosis Date Noted  . Shortness of breath 10/18/2018  . Abnormal chest x-ray 10/18/2018  . Preventative health care 10/13/2018  . Obese 10/13/2018  . Elevated C-reactive protein (CRP) 02/14/2018  . Elevated antinuclear antibody (ANA) level 02/14/2018  . First degree AV block 09/17/2017  . Left atrial enlargement 09/17/2017  . Incidental lung nodule, > 52mm and < 38mm 08/04/2017  . Prediabetes 11/11/2016  . Aortic regurgitation 06/17/2016  . Mitral regurgitation 06/17/2016  . Diastolic dysfunction 24/23/5361  . Hormone replacement therapy 05/26/2016  . Elevated alkaline  phosphatase level 06/13/2015  . Osteoarthritis of both knees 06/13/2015  . 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 Chronic pain 05/24/2015  . Chronic constipation 05/24/2015  . 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 Cardiac murmur 05/24/2015  . Hypertension goal BP (blood pressure) < 150/90 05/24/2015  . 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  . Post menopausal syndrome 05/24/2015  . Skin-picking disorder 05/24/2015     Past Medical History:  Diagnosis Date  . Arthritis   . Diastolic dysfunction 05/20/4314  . GERD (gastroesophageal reflux disease)   . Heart murmur   . Hypertension   . Incidental lung nodule, > 60mm and < 33mm 08/04/2017   4 mm ground glass nodule LLL incidentally noted on CT scan Nov 2016    Past Surgical History:  Procedure Laterality Date  . ABDOMINAL HYSTERECTOMY    . COLONOSCOPY WITH PROPOFOL N/A 10/01/2015   Procedure: COLONOSCOPY WITH PROPOFOL;  Surgeon: Lollie Sails, MD;  Location: [facility] ENDOSCOPY;  Service: Endoscopy;  Laterality: N/A;  . ESOPHAGOGASTRODUODENOSCOPY (EGD) WITH PROPOFOL N/A 10/01/2015   Procedure: ESOPHAGOGASTRODUODENOSCOPY (EGD) WITH PROPOFOL;  Surgeon: Lollie Sails, MD;  Location: [facility] ENDOSCOPY;  Service: Endoscopy;  Laterality: N/A;    Social History   Tobacco Use  . Smoking status: Never Smoker  . Smokeless tobacco: Never Used  . Tobacco comment: smoking cessation materials not required  Substance Use Topics  . Alcohol use: Not Currently    Alcohol/week: 0.0 standard drinks     Current Outpatient Medications:  .  aspirin EC 81 MG tablet, Take 81 mg by mouth daily., Disp: , Rfl:  .  Calcium Carbonate-Vitamin D (CALTRATE 600+D) 600-400 MG-UNIT per tablet, Take 1 tablet by mouth daily. , Disp: , Rfl:  .  Cetirizine HCl 10 MG CAPS, Take 1 capsule (10 mg total) by mouth daily., Disp: 30 capsule, Rfl: 0 .  docusate sodium (COLACE) 250 MG capsule, Take 250 mg by mouth daily., Disp: , Rfl:  .  famotidine (PEPCID) 20 MG tablet, Take 1 tablet (20 mg total) by mouth 2 (two) times daily as needed for heartburn or indigestion., Disp: 60 tablet, Rfl: 2 .  Hyoscyamine Sulfate 0.375 MG TBCR, Take 1 tablet (0.375 mg total) by mouth 2 (two) times daily as needed., Disp: 100 each, Rfl: 1 .  losartan-hydrochlorothiazide (HYZAAR) 100-12.5 MG tablet, TAKE 1/2 TABLET EVERY DAY, Disp: 45 tablet, Rfl: 1 .  MULTIPLE VITAMIN PO, Take 1 tablet by mouth daily. ,  Disp: , Rfl:  .  Probiotic Product (PROBIOTIC ADVANCED PO), Take 1 tablet by mouth daily. , Disp: , Rfl:  .  triamcinolone cream (KENALOG) 0.1 %, Apply 1 application topically 2 (two) times daily. If needed, Disp: 45 g, Rfl: 1 .  TURMERIC PO, Take 1 tablet by mouth daily. , Disp: , Rfl:  .  bacitracin-polymyxin b (POLYSPORIN) ophthalmic ointment, Place 1 application into both eyes 4 (four) times daily. apply to eye every 6 hours while awake for 1-2 weeks until completely resolved. (Patient not taking: Reported on 07/03/2019), Disp: 3.5 g, Rfl: 1  Allergies  Allergen Reactions  . Nitrofurantoin Monohyd Macro     Review of Systems  Constitutional: Negative for chills, fever and malaise/fatigue.  Respiratory: Negative for cough and shortness of breath.   Cardiovascular: Positive for chest pain and palpitations. Negative for orthopnea, leg swelling and PND.  Gastrointestinal: Negative for abdominal pain, constipation, diarrhea, nausea and vomiting.  Musculoskeletal: Negative for joint pain, myalgias and neck pain.  Skin: Negative  for rash.  Neurological: Negative for dizziness and headaches.  Psychiatric/Behavioral: The patient is not nervous/anxious and does not have insomnia.        No other specific complaints in a complete review of systems (except as listed in HPI above).  Objective  Vitals:   07/03/19 0900  BP: 122/76  Pulse: 72  Resp: 14  Temp: (!) 96.6 F (35.9 C)  TempSrc: Temporal  SpO2: 99%  Weight: 209 lb 8 oz (95 kg)  Height: 5\' 9"  (1.753 m)    Body mass index is 30.94 kg/m.  Nursing Note and Vital Signs reviewed.  Physical Exam Vitals signs reviewed.  Constitutional:      Appearance: She is well-developed.  HENT:     Head: Normocephalic and atraumatic.  Neck:     Musculoskeletal: Normal range of motion and neck supple.     Vascular: No carotid bruit.  Cardiovascular:     Heart sounds: Murmur present.  Pulmonary:     Effort: Pulmonary effort is  normal.     Breath sounds: Wheezing (mild expiratory wheezing throughout) present.  Abdominal:     General: Bowel sounds are normal.     Palpations: Abdomen is soft.     Tenderness: There is no abdominal tenderness.  Musculoskeletal: Normal range of motion.  Skin:    General: Skin is warm and dry.     Capillary Refill: Capillary refill takes less than 2 seconds.  Neurological:     Mental Status: She is alert and oriented to person, place, and time.     GCS: GCS eye subscore is 4. GCS verbal subscore is 5. GCS motor subscore is 6.     Sensory: No sensory deficit.  Psychiatric:        Speech: Speech normal.        Behavior: Behavior normal.        Thought Content: Thought content normal.        Judgment: Judgment normal.        No results found for this or any previous visit (from the past 48 hour(s)).  Assessment & Plan  1. Chest pressure Pain is achy, constant, and improving worse to touch- discussed likely costochondritis- utilize heat to area. She states she cannot tolerate NSAIDs, her palpitations may have been due to anxiety related to chest pain- will follow-up with cardiology.  - pantoprazole (PROTONIX) 40 MG tablet; Take 1 tablet (40 mg total) by mouth daily as needed.  Dispense: 30 tablet; Refill: 1  2. Urination frequency Negative for UTI, continue drinking plenty of water.  - POCT urinalysis dipstick  3. Reactive airways dysfunction syndrome (HCC) Restart inhaler for one week and then PRN   4. Benign essential microscopic hematuria - Urinalysis, Routine w reflex microscopic

## 2019-07-03 NOTE — Patient Instructions (Addendum)
Please follow-up with cardiologist, Dr. Gwenyth Ober office for heart fluttering. You can call to set up appointment: [phone].  - I am sending you in a stronger as needed medication for heart burn. You can take this in addition to the famotidine that you already have.   - If you are having gas pains you can take simethicone 50-150mg  as needed.   - Chest wall pain- can use moist warm heat for 20 minutes 3 times a day for the next week. Use your Symbicort inhaler 2 puffs twice a day for the next week.

## 2019-07-04 LAB — URINALYSIS, ROUTINE W REFLEX MICROSCOPIC
Bilirubin Urine: NEGATIVE
Glucose, UA: NEGATIVE
Hgb urine dipstick: NEGATIVE
Ketones, ur: NEGATIVE
Leukocytes,Ua: NEGATIVE
Nitrite: NEGATIVE
Protein, ur: NEGATIVE
Specific Gravity, Urine: 1.016 (ref 1.001–1.03)
pH: <=5 (ref 5.0–8.0)

## 2019-07-10 ENCOUNTER — Other Ambulatory Visit: Payer: Self-pay | Admitting: Family Medicine

## 2019-07-10 NOTE — Telephone Encounter (Signed)
triamcinolone cream (KENALOG) 0.1 %  Send to [facility]

## 2019-07-11 MED ORDER — TRIAMCINOLONE ACETONIDE 0.1 % EX CREA: 1.0000 "application " | TOPICAL_CREAM | Freq: Two times a day (BID) | CUTANEOUS | 1 refills | Status: DC

## 2019-07-27 DIAGNOSIS — H02119 Cicatricial ectropion of unspecified eye, unspecified eyelid: Secondary | ICD-10-CM | POA: Diagnosis not present

## 2019-08-02 ENCOUNTER — Telehealth: Payer: Self-pay | Admitting: *Deleted

## 2019-08-02 NOTE — Telephone Encounter (Signed)
Pt made aware of referral to [facility] from PCP. Pt is in agreement to have upcoming CT scan and follow up as recommended.  Pt has been made aware of upcoming appts for follow up CT scan and follow up appt with Jennifer Burns, NP in the [facility]. Pt verbalized understanding. Nothing further needed at this time.  

## 2019-08-03 DIAGNOSIS — Z20828 Contact with and (suspected) exposure to other viral communicable diseases: Secondary | ICD-10-CM | POA: Diagnosis not present

## 2019-08-04 DIAGNOSIS — Z20828 Contact with and (suspected) exposure to other viral communicable diseases: Secondary | ICD-10-CM | POA: Diagnosis not present

## 2019-08-11 ENCOUNTER — Ambulatory Visit: Payer: Medicare HMO

## 2019-08-11 ENCOUNTER — Ambulatory Visit: Payer: Medicare HMO | Admitting: Nurse Practitioner

## 2019-08-16 ENCOUNTER — Telehealth: Payer: Self-pay | Admitting: Family Medicine

## 2019-08-16 NOTE — Chronic Care Management (AMB) (Signed)
Chronic Care Management   Note  08/16/2019 Name: Ruth Bishop MRN: [id number] DOB: [birth year]  Ruth Bishop is a 73 y.o. year old female who is a primary care patient of Lada, Satira Anis, MD. I reached out to Ruth Bishop by phone today in response to a referral sent by Ruth Bishop health plan.    Ruth Bishop was given information about Chronic Care Management services today including:  1. CCM service includes personalized support from designated clinical staff supervised by her physician, including individualized plan of care and coordination with other care providers 2. 24/7 contact phone numbers for assistance for urgent and routine care needs. 3. Service will only be billed when office clinical staff spend 20 minutes or more in a month to coordinate care. 4. Only one practitioner may furnish and bill the service in a calendar month. 5. The patient may stop CCM services at any time (effective at the end of the month) by phone call to the office staff. 6. The patient will be responsible for cost sharing (co-pay) of up to 20% of the service fee (after annual deductible is met).  Patient agreed to services and verbal consent obtained.   Follow up plan: Telephone appointment with CCM team member scheduled for: 08/30/2019  [employer]  ??[email or web address]   ??[phone]

## 2019-08-17 ENCOUNTER — Ambulatory Visit (INDEPENDENT_AMBULATORY_CARE_PROVIDER_SITE_OTHER): Payer: Medicare HMO | Admitting: Family Medicine

## 2019-08-17 ENCOUNTER — Encounter: Payer: Self-pay | Admitting: Family Medicine

## 2019-08-17 ENCOUNTER — Ambulatory Visit (INDEPENDENT_AMBULATORY_CARE_PROVIDER_SITE_OTHER): Payer: Medicare HMO

## 2019-08-17 ENCOUNTER — Other Ambulatory Visit: Payer: Self-pay

## 2019-08-17 VITALS — BP 142/78 | HR 62 | Temp 96.9°F | Resp 16 | Ht 69.0 in | Wt 211.4 lb

## 2019-08-17 VITALS — BP 142/78 | HR 62 | Temp 96.9°F | Resp 16 | Ht 69.0 in | Wt 211.0 lb

## 2019-08-17 DIAGNOSIS — E785 Hyperlipidemia, unspecified: Secondary | ICD-10-CM

## 2019-08-17 DIAGNOSIS — Z1231 Encounter for screening mammogram for malignant neoplasm of breast: Secondary | ICD-10-CM

## 2019-08-17 DIAGNOSIS — K580 Irritable bowel syndrome with diarrhea: Secondary | ICD-10-CM | POA: Diagnosis not present

## 2019-08-17 DIAGNOSIS — J683 Other acute and subacute respiratory conditions due to chemicals, gases, fumes and vapors: Secondary | ICD-10-CM

## 2019-08-17 DIAGNOSIS — Z23 Encounter for immunization: Secondary | ICD-10-CM

## 2019-08-17 DIAGNOSIS — I10 Essential (primary) hypertension: Secondary | ICD-10-CM | POA: Diagnosis not present

## 2019-08-17 DIAGNOSIS — R7303 Prediabetes: Secondary | ICD-10-CM

## 2019-08-17 DIAGNOSIS — Z0000 Encounter for general adult medical examination without abnormal findings: Secondary | ICD-10-CM | POA: Diagnosis not present

## 2019-08-17 DIAGNOSIS — K219 Gastro-esophageal reflux disease without esophagitis: Secondary | ICD-10-CM | POA: Diagnosis not present

## 2019-08-17 NOTE — Assessment & Plan Note (Signed)
On half dose of 100-12.5 losartan-HCTZ No SE, excellent compliance Check labs today

## 2019-08-17 NOTE — Patient Instructions (Addendum)
Ruth Bishop , Thank you for taking time to come for your Medicare Wellness Visit. I appreciate your ongoing commitment to your health goals. Please review the following plan we discussed and let me know if I can assist you in the future.   Screening recommendations/referrals: Colonoscopy: done 10/01/15. Repeat in 2026 Mammogram: done 08/25/18. Please call [phone] to schedule your mammogram. Bone Density: done 08/25/18 Recommended yearly ophthalmology/optometry visit for glaucoma screening and checkup Recommended yearly dental visit for hygiene and checkup  Vaccinations: Influenza vaccine: given today Pneumococcal vaccine: done 10/23/15 Tdap vaccine: done 05/24/15 Shingles vaccine: 1st Shingrix 08/04/19. Due for second dose 10/04/19    Advanced directives: Please bring a copy of your health care power of attorney and living will to the office at your convenience.  Conditions/risks identified: Keep up the great work!  Next appointment: Please follow up in one year for your Medicare Annual Wellness visit.     Preventive Care 34 Years and Older, Female Preventive care refers to lifestyle choices and visits with your health care provider that can promote health and wellness. What does preventive care include?  A yearly physical exam. This is also called an annual well check.  Dental exams once or twice a year.  Routine eye exams. Ask your health care provider how often you should have your eyes checked.  Personal lifestyle choices, including:  Daily care of your teeth and gums.  Regular physical activity.  Eating a healthy diet.  Avoiding tobacco and drug use.  Limiting alcohol use.  Practicing safe sex.  Taking low-dose aspirin every day.  Taking vitamin and mineral supplements as recommended by your health care provider. What happens during an annual well check? The services and screenings done by your health care provider during your annual well check will depend on  your age, overall health, lifestyle risk factors, and family history of disease. Counseling  Your health care provider may ask you questions about your:  Alcohol use.  Tobacco use.  Drug use.  Emotional well-being.  Home and relationship well-being.  Sexual activity.  Eating habits.  History of falls.  Memory and ability to understand (cognition).  Work and work Statistician.  Reproductive health. Screening  You may have the following tests or measurements:  Height, weight, and BMI.  Blood pressure.  Lipid and cholesterol levels. These may be checked every 5 years, or more frequently if you are over 87 years old.  Skin check.  Lung cancer screening. You may have this screening every year starting at age 65 if you have a 30-pack-year history of smoking and currently smoke or have quit within the past 15 years.  Fecal occult blood test (FOBT) of the stool. You may have this test every year starting at age 78.  Flexible sigmoidoscopy or colonoscopy. You may have a sigmoidoscopy every 5 years or a colonoscopy every 10 years starting at age 42.  Hepatitis C blood test.  Hepatitis B blood test.  Sexually transmitted disease (STD) testing.  Diabetes screening. This is done by checking your blood sugar (glucose) after you have not eaten for a while (fasting). You may have this done every 1-3 years.  Bone density scan. This is done to screen for osteoporosis. You may have this done starting at age 37.  Mammogram. This may be done every 1-2 years. Talk to your health care provider about how often you should have regular mammograms. Talk with your health care provider about your test results, treatment options, and if necessary, the  need for more tests. Vaccines  Your health care provider may recommend certain vaccines, such as:  Influenza vaccine. This is recommended every year.  Tetanus, diphtheria, and acellular pertussis (Tdap, Td) vaccine. You may need a Td booster  every 10 years.  Zoster vaccine. You may need this after age 2.  Pneumococcal 13-valent conjugate (PCV13) vaccine. One dose is recommended after age 49.  Pneumococcal polysaccharide (PPSV23) vaccine. One dose is recommended after age 64. Talk to your health care provider about which screenings and vaccines you need and how often you need them. This information is not intended to replace advice given to you by your health care provider. Make sure you discuss any questions you have with your health care provider. Document Released: 12/20/2015 Document Revised: 08/12/2016 Document Reviewed: 09/24/2015 Elsevier Interactive Patient Education  2017 [place] Prevention in the Home Falls can cause injuries. They can happen to people of all ages. There are many things you can do to make your home safe and to help prevent falls. What can I do on the outside of my home?  Regularly fix the edges of walkways and driveways and fix any cracks.  Remove anything that might make you trip as you walk through a door, such as a raised step or threshold.  Trim any bushes or trees on the path to your home.  Use bright outdoor lighting.  Clear any walking paths of anything that might make someone trip, such as rocks or tools.  Regularly check to see if handrails are loose or broken. Make sure that both sides of any steps have handrails.  Any raised decks and porches should have guardrails on the edges.  Have any leaves, snow, or ice cleared regularly.  Use sand or salt on walking paths during winter.  Clean up any spills in your garage right away. This includes oil or grease spills. What can I do in the bathroom?  Use night lights.  Install grab bars by the toilet and in the tub and shower. Do not use towel bars as grab bars.  Use non-skid mats or decals in the tub or shower.  If you need to sit down in the shower, use a plastic, non-slip stool.  Keep the floor dry. Clean up any  water that spills on the floor as soon as it happens.  Remove soap buildup in the tub or shower regularly.  Attach bath mats securely with double-sided non-slip rug tape.  Do not have throw rugs and other things on the floor that can make you trip. What can I do in the bedroom?  Use night lights.  Make sure that you have a light by your bed that is easy to reach.  Do not use any sheets or blankets that are too big for your bed. They should not hang down onto the floor.  Have a firm chair that has side arms. You can use this for support while you get dressed.  Do not have throw rugs and other things on the floor that can make you trip. What can I do in the kitchen?  Clean up any spills right away.  Avoid walking on wet floors.  Keep items that you use a lot in easy-to-reach places.  If you need to reach something above you, use a strong step stool that has a grab bar.  Keep electrical cords out of the way.  Do not use floor polish or wax that makes floors slippery. If you must use wax,  use non-skid floor wax.  Do not have throw rugs and other things on the floor that can make you trip. What can I do with my stairs?  Do not leave any items on the stairs.  Make sure that there are handrails on both sides of the stairs and use them. Fix handrails that are broken or loose. Make sure that handrails are as long as the stairways.  Check any carpeting to make sure that it is firmly attached to the stairs. Fix any carpet that is loose or worn.  Avoid having throw rugs at the top or bottom of the stairs. If you do have throw rugs, attach them to the floor with carpet tape.  Make sure that you have a light switch at the top of the stairs and the bottom of the stairs. If you do not have them, ask someone to add them for you. What else can I do to help prevent falls?  Wear shoes that:  Do not have high heels.  Have rubber bottoms.  Are comfortable and fit you well.  Are closed  at the toe. Do not wear sandals.  If you use a stepladder:  Make sure that it is fully opened. Do not climb a closed stepladder.  Make sure that both sides of the stepladder are locked into place.  Ask someone to hold it for you, if possible.  Clearly mark and make sure that you can see:  Any grab bars or handrails.  First and last steps.  Where the edge of each step is.  Use tools that help you move around (mobility aids) if they are needed. These include:  Canes.  Walkers.  Scooters.  Crutches.  Turn on the lights when you go into a dark area. Replace any light bulbs as soon as they burn out.  Set up your furniture so you have a clear path. Avoid moving your furniture around.  If any of your floors are uneven, fix them.  If there are any pets around you, be aware of where they are.  Review your medicines with your doctor. Some medicines can make you feel dizzy. This can increase your chance of falling. Ask your doctor what other things that you can do to help prevent falls. This information is not intended to replace advice given to you by your health care provider. Make sure you discuss any questions you have with your health care provider. Document Released: 09/19/2009 Document Revised: 04/30/2016 Document Reviewed: 12/28/2014 Elsevier Interactive Patient Education  2017 [place].  Influenza (Flu) Vaccine (Inactivated or Recombinant): What You Need to Know 1. Why get vaccinated? Influenza vaccine can prevent influenza (flu). Flu is a contagious disease that spreads around the [place] every year, usually between October and May. Anyone can get the flu, but it is more dangerous for some people. Infants and young children, people 66 years of age and older, pregnant women, and people with certain health conditions or a weakened immune system are at greatest risk of flu complications. Pneumonia, bronchitis, sinus infections and ear infections are examples of  flu-related complications. If you have a medical condition, such as heart disease, cancer or diabetes, flu can make it worse. Flu can cause fever and chills, sore throat, muscle aches, fatigue, cough, headache, and runny or stuffy nose. Some people may have vomiting and diarrhea, though this is more common in children than adults. Each year thousands of people in the [place] die from flu, and many more are hospitalized.  Flu vaccine prevents millions of illnesses and flu-related visits to the doctor each year. 2. Influenza vaccine CDC recommends everyone 67 months of age and older get vaccinated every flu season. Children 6 months through 73 years of age may need 2 doses during a single flu season. Everyone else needs only 1 dose each flu season. It takes about 2 weeks for protection to develop after vaccination. There are many flu viruses, and they are always changing. Each year a new flu vaccine is made to protect against three or four viruses that are likely to cause disease in the upcoming flu season. Even when the vaccine doesn't exactly match these viruses, it may still provide some protection. Influenza vaccine does not cause flu. Influenza vaccine may be given at the same time as other vaccines. 3. Talk with your health care provider Tell your vaccine provider if the person getting the vaccine:  Has had an allergic reaction after a previous dose of influenza vaccine, or has any severe, life-threatening allergies.  Has ever had Guillain-Barr Syndrome (also called GBS). In some cases, your health care provider may decide to postpone influenza vaccination to a future visit. People with minor illnesses, such as a cold, may be vaccinated. People who are moderately or severely ill should usually wait until they recover before getting influenza vaccine. Your health care provider can give you more information. 4. Risks of a vaccine reaction  Soreness, redness, and swelling where shot is  given, fever, muscle aches, and headache can happen after influenza vaccine.  There may be a very small increased risk of Guillain-Barr Syndrome (GBS) after inactivated influenza vaccine (the flu shot). Young children who get the flu shot along with pneumococcal vaccine (PCV13), and/or DTaP vaccine at the same time might be slightly more likely to have a seizure caused by fever. Tell your health care provider if a child who is getting flu vaccine has ever had a seizure. People sometimes faint after medical procedures, including vaccination. Tell your provider if you feel dizzy or have vision changes or ringing in the ears. As with any medicine, there is a very remote chance of a vaccine causing a severe allergic reaction, other serious injury, or death. 5. What if there is a serious problem? An allergic reaction could occur after the vaccinated person leaves the clinic. If you see signs of a severe allergic reaction (hives, swelling of the face and throat, difficulty breathing, a fast heartbeat, dizziness, or weakness), call 9-1-1 and get the person to the nearest hospital. For other signs that concern you, call your health care provider. Adverse reactions should be reported to the Vaccine Adverse Event Reporting System (VAERS). Your health care provider will usually file this report, or you can do it yourself. Visit the VAERS website at www.vaers.SamedayNews.es or call 6678319136.VAERS is only for reporting reactions, and VAERS staff do not give medical advice. 6. The National Vaccine Injury Compensation Program The [employer] Vaccine Injury Compensation Program (VICP) is a federal program that was created to compensate people who may have been injured by certain vaccines. Visit the VICP website at GoldCloset.com.ee or call 818-852-0277 to learn about the program and about filing a claim. There is a time limit to file a claim for compensation. 7. How can I learn more?  Ask your healthcare  provider.  Call your local or state health department.  Contact the Centers for Disease Control and Prevention (CDC): ? Call 913 417 2212 (1-800-CDC-INFO) or ? Visit CDC's https://gibson.com/ Vaccine Information Statement (Interim) Inactivated Influenza  Vaccine (07/21/2018) This information is not intended to replace advice given to you by your health care provider. Make sure you discuss any questions you have with your health care provider. Document Released: 09/17/2006 Document Revised: 03/14/2019 Document Reviewed: 07/25/2018 Elsevier Patient Education  2020 [employer].

## 2019-08-17 NOTE — Assessment & Plan Note (Signed)
On PPI PRN, improving with meds

## 2019-08-17 NOTE — Assessment & Plan Note (Signed)
Morning congestion with some coughing to clear throat, not using inhaler now and denies SOB, wheeze CP, chest tightness No hx of asthma no smoking hx

## 2019-08-17 NOTE — Patient Instructions (Addendum)
Start taking a daily zyrtec and start using flonase and see if that will help decrease your possible post nasal drip and clearing of the throat.    We will call you with your labs  Please check your blood pressure for the next to weeks and let me know if it is >150/90 and we will need to increase your blood pressure medicines.    My preference for you would be to keep your blood pressure 120/70 - 140/80 in that range    Calorie Counting for Weight Loss  Check out Your basal metabolic rate around [id number] cal/day  Calories are units of energy. Your body needs a certain amount of calories from food to keep you going throughout the day. When you eat more calories than your body needs, your body stores the extra calories as fat. When you eat fewer calories than your body needs, your body burns fat to get the energy it needs. Calorie counting means keeping track of how many calories you eat and drink each day. Calorie counting can be helpful if you need to lose weight. If you make sure to eat fewer calories than your body needs, you should lose weight. Ask your health care provider what a healthy weight is for you. For calorie counting to work, you will need to eat the right number of calories in a day in order to lose a healthy amount of weight per week. A dietitian can help you determine how many calories you need in a day and will give you suggestions on how to reach your calorie goal.  A healthy amount of weight to lose per week is usually 1-2 lb (0.5-0.9 kg). This usually means that your daily calorie intake should be reduced by 500-750 calories.  Eating 1,200 - 1,500 calories per day can help most women lose weight.  Eating 1,500 - 1,800 calories per day can help most men lose weight. What is my plan? My goal is to have __________ calories per day. If I have this many calories per day, I should lose around __________ pounds per week. What do I need to know about calorie counting? In order  to meet your daily calorie goal, you will need to:  Find out how many calories are in each food you would like to eat. Try to do this before you eat.  Decide how much of the food you plan to eat.  Write down what you ate and how many calories it had. Doing this is called keeping a food log. To successfully lose weight, it is important to balance calorie counting with a healthy lifestyle that includes regular activity. Aim for 150 minutes of moderate exercise (such as walking) or 75 minutes of vigorous exercise (such as running) each week. Where do I find calorie information?  The number of calories in a food can be found on a Nutrition Facts label. If a food does not have a Nutrition Facts label, try to look up the calories online or ask your dietitian for help. Remember that calories are listed per serving. If you choose to have more than one serving of a food, you will have to multiply the calories per serving by the amount of servings you plan to eat. For example, the label on a package of bread might say that a serving size is 1 slice and that there are 90 calories in a serving. If you eat 1 slice, you will have eaten 90 calories. If you eat 2 slices, you will  have eaten 180 calories. How do I keep a food log? Immediately after each meal, record the following information in your food log:  What you ate. Don't forget to include toppings, sauces, and other extras on the food.  How much you ate. This can be measured in cups, ounces, or number of items.  How many calories each food and drink had.  The total number of calories in the meal. Keep your food log near you, such as in a small notebook in your pocket, or use a mobile app or website. Some programs will calculate calories for you and show you how many calories you have left for the day to meet your goal. What are some calorie counting tips?   Use your calories on foods and drinks that will fill you up and not leave you  hungry: ? Some examples of foods that fill you up are nuts and nut butters, vegetables, lean proteins, and high-fiber foods like whole grains. High-fiber foods are foods with more than 5 g fiber per serving. ? Drinks such as sodas, specialty coffee drinks, alcohol, and juices have a lot of calories, yet do not fill you up.  Eat nutritious foods and avoid empty calories. Empty calories are calories you get from foods or beverages that do not have many vitamins or protein, such as candy, sweets, and soda. It is better to have a nutritious high-calorie food (such as an avocado) than a food with few nutrients (such as a bag of chips).  Know how many calories are in the foods you eat most often. This will help you calculate calorie counts faster.  Pay attention to calories in drinks. Low-calorie drinks include water and unsweetened drinks.  Pay attention to nutrition labels for "low fat" or "fat free" foods. These foods sometimes have the same amount of calories or more calories than the full fat versions. They also often have added sugar, starch, or salt, to make up for flavor that was removed with the fat.  Find a way of tracking calories that works for you. Get creative. Try different apps or programs if writing down calories does not work for you. What are some portion control tips?  Know how many calories are in a serving. This will help you know how many servings of a certain food you can have.  Use a measuring cup to measure serving sizes. You could also try weighing out portions on a kitchen scale. With time, you will be able to estimate serving sizes for some foods.  Take some time to put servings of different foods on your favorite plates, bowls, and cups so you know what a serving looks like.  Try not to eat straight from a bag or box. Doing this can lead to overeating. Put the amount you would like to eat in a cup or on a plate to make sure you are eating the right portion.  Use smaller  plates, glasses, and bowls to prevent overeating.  Try not to multitask (for example, watch TV or use your computer) while eating. If it is time to eat, sit down at a table and enjoy your food. This will help you to know when you are full. It will also help you to be aware of what you are eating and how much you are eating. What are tips for following this plan? Reading food labels  Check the calorie count compared to the serving size. The serving size may be smaller than what you are used  to eating.  Check the source of the calories. Make sure the food you are eating is high in vitamins and protein and low in saturated and trans fats. Shopping  Read nutrition labels while you shop. This will help you make healthy decisions before you decide to purchase your food.  Make a grocery list and stick to it. Cooking  Try to cook your favorite foods in a healthier way. For example, try baking instead of frying.  Use low-fat dairy products. Meal planning  Use more fruits and vegetables. Half of your plate should be fruits and vegetables.  Include lean proteins like poultry and fish. How do I count calories when eating out?  Ask for smaller portion sizes.  Consider sharing an entree and sides instead of getting your own entree.  If you get your own entree, eat only half. Ask for a box at the beginning of your meal and put the rest of your entree in it so you are not tempted to eat it.  If calories are listed on the menu, choose the lower calorie options.  Choose dishes that include vegetables, fruits, whole grains, low-fat dairy products, and lean protein.  Choose items that are boiled, broiled, grilled, or steamed. Stay away from items that are buttered, battered, fried, or served with cream sauce. Items labeled "crispy" are usually fried, unless stated otherwise.  Choose water, low-fat milk, unsweetened iced tea, or other drinks without added sugar. If you want an alcoholic beverage,  choose a lower calorie option such as a glass of wine or light beer.  Ask for dressings, sauces, and syrups on the side. These are usually high in calories, so you should limit the amount you eat.  If you want a salad, choose a garden salad and ask for grilled meats. Avoid extra toppings like bacon, cheese, or fried items. Ask for the dressing on the side, or ask for olive oil and vinegar or lemon to use as dressing.  Estimate how many servings of a food you are given. For example, a serving of cooked rice is  cup or about the size of half a baseball. Knowing serving sizes will help you be aware of how much food you are eating at restaurants. The list below tells you how big or small some common portion sizes are based on everyday objects: ? 1 oz-4 stacked dice. ? 3 oz-1 deck of cards. ? 1 tsp-1 die. ? 1 Tbsp- a ping-pong ball. ? 2 Tbsp-1 ping-pong ball. ?  cup- baseball. ? 1 cup-1 baseball. Summary  Calorie counting means keeping track of how many calories you eat and drink each day. If you eat fewer calories than your body needs, you should lose weight.  A healthy amount of weight to lose per week is usually 1-2 lb (0.5-0.9 kg). This usually means reducing your daily calorie intake by 500-750 calories.  The number of calories in a food can be found on a Nutrition Facts label. If a food does not have a Nutrition Facts label, try to look up the calories online or ask your dietitian for help.  Use your calories on foods and drinks that will fill you up, and not on foods and drinks that will leave you hungry.  Use smaller plates, glasses, and bowls to prevent overeating. This information is not intended to replace advice given to you by your health care provider. Make sure you discuss any questions you have with your health care provider. Document Released: 11/23/2005 Document Revised: 08/12/2018  Document Reviewed: 10/23/2016 Elsevier Patient Education  [employer].

## 2019-08-17 NOTE — Progress Notes (Signed)
Subjective:   Ruth Bishop is a 73 y.o. female who presents for Medicare Annual (Subsequent) preventive examination.  Review of Systems:   Cardiac Risk Factors include: advanced age (>27men, >30 women);hypertension;obesity (BMI >30kg/m2)     Objective:     Vitals: BP (!) 142/78 (BP Location: Right Arm, Patient Position: Sitting, Cuff Size: Normal)   Pulse 62   Temp (!) 96.9 F (36.1 C) (Temporal)   Resp 16   Ht 5\' 9"  (1.753 m)   Wt 211 lb 6.4 oz (95.9 kg)   SpO2 98%   BMI 31.22 kg/m   Body mass index is 31.22 kg/m.  Advanced Directives 08/17/2019 06/28/2019 08/05/2018 08/04/2017 01/29/2017 01/13/2017 11/11/2016  Does Patient Have a Medical Advance Directive? Yes No Yes Yes Yes Yes Yes  Type of Paramedic of [place];Living will - [place];Living will - - - Living will;Healthcare Power of Attorney  Does patient want to make changes to medical advance directive? - - - - - - -  Copy of [place] in Chart? No - copy requested - No - copy requested - - - -  Would patient like information on creating a medical advance directive? - No - Patient declined - - - - -    Tobacco Social History   Tobacco Use  Smoking Status Never Smoker  Smokeless Tobacco Never Used  Tobacco Comment   smoking cessation materials not required     Counseling given: Not Answered Comment: smoking cessation materials not required   Clinical Intake:  Pre-visit preparation completed: Yes  Pain : No/denies pain     BMI - recorded: 31.22 Nutritional Status: BMI > 30  Obese Nutritional Risks: None Diabetes: No  How often do you need to have someone help you when you read instructions, pamphlets, or other written materials from your doctor or pharmacy?: 1 - Never  Interpreter Needed?: No  Information entered by :: Clemetine Marker LPN  Past Medical History:  Diagnosis Date  . Arthritis   . Diastolic dysfunction 99991111  . GERD  (gastroesophageal reflux disease)   . Heart murmur   . Hypertension   . Incidental lung nodule, > 30mm and < 67mm 08/04/2017   4 mm ground glass nodule LLL incidentally noted on CT scan Nov 2016   Past Surgical History:  Procedure Laterality Date  . ABDOMINAL HYSTERECTOMY    . COLONOSCOPY WITH PROPOFOL N/A 10/01/2015   Procedure: COLONOSCOPY WITH PROPOFOL;  Surgeon: Lollie Sails, MD;  Location: [facility] ENDOSCOPY;  Service: Endoscopy;  Laterality: N/A;  . ESOPHAGOGASTRODUODENOSCOPY (EGD) WITH PROPOFOL N/A 10/01/2015   Procedure: ESOPHAGOGASTRODUODENOSCOPY (EGD) WITH PROPOFOL;  Surgeon: Lollie Sails, MD;  Location: [facility] ENDOSCOPY;  Service: Endoscopy;  Laterality: N/A;   Family History  Problem Relation Age of Onset  . Heart disease Mother   . Diabetes Mother   . Aneurysm Mother   . Heart disease Father   . Heart attack Father   . Heart disease Sister   . Diabetes Sister   . Pneumonia Sister   . Heart disease Brother   . Diabetes Brother   . Diabetes Sister   . Heart disease Sister   . Congestive Heart Failure Sister   . Breast cancer Neg Hx    Social History   Socioeconomic History  . Marital status: Widowed    Spouse name: Not on file  . Number of children: 1  . Years of education: Not on file  . Highest education  level: Master's degree (e.g., MA, MS, MEng, MEd, MSW, MBA)  Occupational History  . Occupation: Retired  Scientific laboratory technician  . Financial resource strain: Not hard at all  . Food insecurity    Worry: Never true    Inability: Never true  . Transportation needs    Medical: No    Non-medical: No  Tobacco Use  . Smoking status: Never Smoker  . Smokeless tobacco: Never Used  . Tobacco comment: smoking cessation materials not required  Substance and Sexual Activity  . Alcohol use: Not Currently    Alcohol/week: 0.0 standard drinks  . Drug use: No  . Sexual activity: Not Currently  Lifestyle  . Physical activity    Days per week: 4 days    Minutes per  session: 30 min  . Stress: Only a little  Relationships  . Social Herbalist on phone: Three times a week    Gets together: Once a week    Attends religious service: More than 4 times per year    Active member of club or organization: No    Attends meetings of clubs or organizations: Never    Relationship status: Widowed  Other Topics Concern  . Not on file  Social History Narrative  . Not on file    Outpatient Encounter Medications as of 08/17/2019  Medication Sig  . aspirin EC 81 MG tablet Take 81 mg by mouth daily.  . Calcium Carbonate-Vitamin D (CALTRATE 600+D) 600-400 MG-UNIT per tablet Take 1 tablet by mouth daily.   . Cetirizine HCl 10 MG CAPS Take 1 capsule (10 mg total) by mouth daily.  Marland Kitchen docusate sodium (COLACE) 250 MG capsule Take 250 mg by mouth daily.  . famotidine (PEPCID) 20 MG tablet Take 1 tablet (20 mg total) by mouth 2 (two) times daily as needed for heartburn or indigestion.  Marland Kitchen Hyoscyamine Sulfate 0.375 MG TBCR Take 1 tablet (0.375 mg total) by mouth 2 (two) times daily as needed.  Marland Kitchen losartan-hydrochlorothiazide (HYZAAR) 100-12.5 MG tablet TAKE 1/2 TABLET EVERY DAY  . MULTIPLE VITAMIN PO Take 1 tablet by mouth daily.   . pantoprazole (PROTONIX) 40 MG tablet Take 1 tablet (40 mg total) by mouth daily as needed.  . psyllium (METAMUCIL) 0.52 g capsule Take 0.52 g by mouth daily.  Marland Kitchen triamcinolone cream (KENALOG) 0.1 % Apply 1 application topically 2 (two) times daily. If needed  . TURMERIC PO Take 1 tablet by mouth daily.   . [DISCONTINUED] bacitracin-polymyxin b (POLYSPORIN) ophthalmic ointment Place 1 application into both eyes 4 (four) times daily. apply to eye every 6 hours while awake for 1-2 weeks until completely resolved. (Patient not taking: Reported on 07/03/2019)  . [DISCONTINUED] Probiotic Product (PROBIOTIC ADVANCED PO) Take 1 tablet by mouth daily.    No facility-administered encounter medications on file as of 08/17/2019.     Activities of  Daily Living In your present state of health, do you have any difficulty performing the following activities: 08/17/2019 07/03/2019  Hearing? N N  Comment declines hearing aids -  Vision? N N  Difficulty concentrating or making decisions? N N  Walking or climbing stairs? N N  Dressing or bathing? N N  Doing errands, shopping? N N  Preparing Food and eating ? N -  Using the Toilet? N -  In the past six months, have you accidently leaked urine? N -  Do you have problems with loss of bowel control? N -  Managing your Medications? N -  Managing your Finances? N -  Housekeeping or managing your Housekeeping? N -  Some recent data might be hidden    Patient Care Team: Delsa Grana, PA-C as PCP - General (Family Medicine) Lahoma Rocker, MD as Consulting Physician (Rheumatology) Minna Merritts, MD as Consulting Physician (Cardiology)    Assessment:   This is a routine wellness examination for Javionna.  Exercise Activities and Dietary recommendations Current Exercise Habits: Home exercise routine, Type of exercise: calisthenics;walking, Time (Minutes): 30, Frequency (Times/Week): 4, Weekly Exercise (Minutes/Week): 120, Intensity: Moderate, Exercise limited by: None identified  Goals    . DIET - INCREASE WATER INTAKE     Recommend to drink at least 6-8 8oz glasses of water per day.       Fall Risk Fall Risk  08/17/2019 07/03/2019 05/03/2019 03/28/2019 10/20/2018  Falls in the past year? 0 0 0 0 1  Number falls in past yr: 0 0 - - 0  Injury with Fall? 0 0 - - 0  Risk for fall due to : - - - - -  Risk for fall due to: Comment - - - - -  Follow up Falls prevention discussed - - - -   FALL RISK PREVENTION PERTAINING TO THE HOME:  Any stairs in or around the home? No  If so, do they handrails? No   Home free of loose throw rugs in walkways, pet beds, electrical cords, etc? Yes  Adequate lighting in your home to reduce risk of falls? Yes   ASSISTIVE DEVICES UTILIZED TO PREVENT  FALLS:  Life alert? No  Use of a cane, walker or w/c? No  Grab bars in the bathroom? Yes  Shower chair or bench in shower? Yes  Elevated toilet seat or a handicapped toilet? Yes   DME ORDERS:  DME order needed?  No   TIMED UP AND GO:  Was the test performed? Yes .  Length of time to ambulate 10 feet: 5 sec.   GAIT:  Appearance of gait: Gait stead-fast and without the use of an assistive device.   Education: Fall risk prevention has been discussed.  Intervention(s) required? No    Depression Screen PHQ 2/9 Scores 08/17/2019 07/03/2019 05/03/2019 10/20/2018  PHQ - 2 Score 0 0 1 0  PHQ- 9 Score - 0 1 0     Cognitive Function     6CIT Screen 08/17/2019 08/05/2018  What Year? 0 points 0 points  What month? 0 points 0 points  What time? 0 points 0 points  Count back from 20 0 points 0 points  Months in reverse 0 points 0 points  Repeat phrase 0 points 4 points  Total Score 0 4    Immunization History  Administered Date(s) Administered  . Fluad Quad(high Dose 65+) 08/17/2019  . Influenza, High Dose Seasonal PF 08/04/2017, 08/05/2018  . Influenza, Seasonal, Injecte, Preservative Fre 10/10/2014  . Influenza-Unspecified 10/23/2015, 10/06/2016  . Pneumococcal Conjugate-13 10/10/2014  . Pneumococcal Polysaccharide-23 05/24/2015, 10/23/2015  . Tdap 05/24/2015  . Zoster Recombinat (Shingrix) 08/04/2019    Qualifies for Shingles Vaccine? Yes  1st dose of Shingrix completed.   Tdap: Up to date  Flu Vaccine: Due for Flu vaccine. Does the patient want to receive this vaccine today?  Yes . Education has been provided regarding the importance of this vaccine but still declined. Advised may receive this vaccine at local pharmacy or Health Dept. Aware to provide a copy of the vaccination record if obtained from local pharmacy or Health Dept. Verbalized  acceptance and understanding.  Pneumococcal Vaccine: Up to date   Screening Tests Health Maintenance  Topic Date Due  .  INFLUENZA VACCINE  07/08/2019  . MAMMOGRAM  08/26/2019  . TETANUS/TDAP  05/23/2025  . COLONOSCOPY  09/30/2025  . DEXA SCAN  Completed  . Hepatitis C Screening  Completed  . PNA vac Low Risk Adult  Completed    Cancer Screenings:  Colorectal Screening: Completed 10/01/15. Repeat every 10 years;   Mammogram: Completed 08/25/18. Repeat every year. Ordered today. Pt provided with contact information and advised to call to schedule appt.   Bone Density: Completed 08/25/18. Results reflect NORMAL. Repeat every 2 years.   Lung Cancer Screening: (Low Dose CT Chest recommended if Age 39-80 years, 30 pack-year currently smoking OR have quit w/in 15years.) does not qualify.    Additional Screening:  Hepatitis C Screening: does qualify; Completed 06/13/15  Vision Screening: Recommended annual ophthalmology exams for early detection of glaucoma and other disorders of the eye. Is the patient up to date with their annual eye exam?  Yes  Who is the provider or what is the name of the office in which the pt attends annual eye exams? [place]   Dental Screening: Recommended annual dental exams for proper oral hygiene  Community Resource Referral:  CRR required this visit?  No      Plan:     I have personally reviewed and addressed the Medicare Annual Wellness questionnaire and have noted the following in the patient's chart:  A. Medical and social history B. Use of alcohol, tobacco or illicit drugs  C. Current medications and supplements D. Functional ability and status E.  Nutritional status F.  Physical activity G. Advance directives H. List of other physicians I.  Hospitalizations, surgeries, and ER visits in previous 12 months J.  [place] such as hearing and vision if needed, cognitive and depression L. Referrals and appointments   In addition, I have reviewed and discussed with patient certain preventive protocols, quality metrics, and best practice  recommendations. A written personalized care plan for preventive services as well as general preventive health recommendations were provided to patient.   Signed,  Clemetine Marker, LPN Nurse Health Advisor   Nurse Notes: none. Same day follow up appt with Delsa Grana PAC.

## 2019-08-17 NOTE — Assessment & Plan Note (Signed)
Currently no constipation and no diarrhea, but some gassiness and bloating/rumbling

## 2019-08-17 NOTE — Progress Notes (Signed)
Name: Ruth Bishop   MRN: [id number]    DOB: 1946/06/28   Date:08/17/2019       Progress Note  Chief Complaint  Patient presents with  . Follow-up  . Hypertension  . Gastroesophageal Reflux    Subjective:   Ruth Bishop is a 73 y.o. female, presents to clinic for routine follow up on the conditions listed above.  HTN - Dr. Jarold Bishop goal with pt was <150/90 and she is taking losartan-HCTZ 100-12.5 half a pill daily, no se or sx  BP monitor at home - she has one but doesn't use regularly  Prediabetes -   Diet controlled, no sx Lab Results  Component Value Date   HGBA1C 5.8 (H) 10/13/2018    Cholesterol - in the past triglycerides high and HDL low, not on meds Had activia smoothy this morning will get labs anyways, trying to have healthy diet Lab Results  Component Value Date   CHOL 134 10/13/2018   HDL 39 (L) 10/13/2018   LDLCALC 64 10/13/2018   TRIG 261 (H) 10/13/2018   CHOLHDL 3.4 10/13/2018   The 10-year ASCVD risk score Ruth Bishop., et al., 2013) is: 10.1%   Values used to calculate the score:     Age: 51 years     Sex: Female     Is Non-Hispanic African American: Yes     Diabetic: No     Tobacco smoker: No     Systolic Blood Pressure: [id number] mmHg     Is BP treated: Yes     HDL Cholesterol: 39 mg/dL     Total Cholesterol: 134 mg/dL Statin indicated per ascvd   RAD - asthma? In chart hx, she complains of having to clear throat daily and she denies SOB, Wheeze, CP Has some allergies, but doesn't currently feel congested or any worsening drainage.  She has GERD as well, on protonix, taking intermittently, some reflux sx     Patient Active Problem List   Diagnosis Date Noted  . 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 Shortness of breath 10/18/2018  . Abnormal chest x-ray 10/18/2018  . Preventative health care 10/13/2018  . Obese 10/13/2018  . Elevated C-reactive protein (CRP) 02/14/2018  . Elevated antinuclear antibody (ANA) level  02/14/2018  . First degree AV block 09/17/2017  . Left atrial enlargement 09/17/2017  . Incidental lung nodule, > 37mm and < 57mm 08/04/2017  . Prediabetes 11/11/2016  . Aortic regurgitation 06/17/2016  . Mitral regurgitation 06/17/2016  . Diastolic dysfunction [id number]  . Hormone replacement therapy 05/26/2016  . Elevated alkaline phosphatase level 06/13/2015  . Osteoarthritis of both knees 06/13/2015  . 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 Chronic pain 05/24/2015  . Chronic constipation 05/24/2015  . 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 Cardiac murmur 05/24/2015  . Hypertension goal BP (blood pressure) < 150/90 05/24/2015  . 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  . Post menopausal syndrome 05/24/2015  . Skin-picking disorder 05/24/2015    Past Surgical History:  Procedure Laterality Date  . ABDOMINAL HYSTERECTOMY    . COLONOSCOPY WITH PROPOFOL N/A 10/01/2015   Procedure: COLONOSCOPY WITH PROPOFOL;  Surgeon: Lollie Sails, MD;  Location: [facility] ENDOSCOPY;  Service: Endoscopy;  Laterality: N/A;  . ESOPHAGOGASTRODUODENOSCOPY (EGD) WITH PROPOFOL N/A 10/01/2015   Procedure: ESOPHAGOGASTRODUODENOSCOPY (EGD) WITH PROPOFOL;  Surgeon: Lollie Sails, MD;  Location: [facility] ENDOSCOPY;  Service: Endoscopy;  Laterality: N/A;    Family History  Problem Relation Age of Onset  . Heart  disease Mother   . Diabetes Mother   . Aneurysm Mother   . Heart disease Father   . Heart attack Father   . Heart disease Sister   . Diabetes Sister   . Pneumonia Sister   . Heart disease Brother   . Diabetes Brother   . Diabetes Sister   . Heart disease Sister   . Congestive Heart Failure Sister   . Breast cancer Neg Hx     Social History   Socioeconomic History  . Marital status: Widowed    Spouse name: Not on file  . Number of children: 1  . Years of education: Not on file  . Highest education level: Master's degree (e.g., MA, MS, MEng,  MEd, MSW, MBA)  Occupational History  . Occupation: Retired  Scientific laboratory technician  . Financial resource strain: Not hard at all  . Food insecurity    Worry: Never true    Inability: Never true  . Transportation needs    Medical: No    Non-medical: No  Tobacco Use  . Smoking status: Never Smoker  . Smokeless tobacco: Never Used  . Tobacco comment: smoking cessation materials not required  Substance and Sexual Activity  . Alcohol use: Not Currently    Alcohol/week: 0.0 standard drinks  . Drug use: No  . Sexual activity: Not Currently  Lifestyle  . Physical activity    Days per week: 4 days    Minutes per session: 30 min  . Stress: Only a little  Relationships  . Social Herbalist on phone: Three times a week    Gets together: Once a week    Attends religious service: More than 4 times per year    Active member of club or organization: No    Attends meetings of clubs or organizations: Never    Relationship status: Widowed  . Intimate partner violence    Fear of current or ex partner: No    Emotionally abused: No    Physically abused: No    Forced sexual activity: No  Other Topics Concern  . Not on file  Social History Narrative  . Not on file     Current Outpatient Medications:  .  aspirin EC 81 MG tablet, Take 81 mg by mouth daily., Disp: , Rfl:  .  Calcium Carbonate-Vitamin D (CALTRATE 600+D) 600-400 MG-UNIT per tablet, Take 1 tablet by mouth daily. , Disp: , Rfl:  .  Cetirizine HCl 10 MG CAPS, Take 1 capsule (10 mg total) by mouth daily., Disp: 30 capsule, Rfl: 0 .  docusate sodium (COLACE) 250 MG capsule, Take 250 mg by mouth daily., Disp: , Rfl:  .  famotidine (PEPCID) 20 MG tablet, Take 1 tablet (20 mg total) by mouth 2 (two) times daily as needed for heartburn or indigestion., Disp: 60 tablet, Rfl: 2 .  Hyoscyamine Sulfate 0.375 MG TBCR, Take 1 tablet (0.375 mg total) by mouth 2 (two) times daily as needed., Disp: 100 each, Rfl: 1 .   losartan-hydrochlorothiazide (HYZAAR) 100-12.5 MG tablet, TAKE 1/2 TABLET EVERY DAY, Disp: 45 tablet, Rfl: 1 .  MULTIPLE VITAMIN PO, Take 1 tablet by mouth daily. , Disp: , Rfl:  .  pantoprazole (PROTONIX) 40 MG tablet, Take 1 tablet (40 mg total) by mouth daily as needed., Disp: 30 tablet, Rfl: 1 .  psyllium (METAMUCIL) 0.52 g capsule, Take 0.52 g by mouth daily., Disp: , Rfl:  .  triamcinolone cream (KENALOG) 0.1 %, Apply 1 application topically 2 (  two) times daily. If needed, Disp: 45 g, Rfl: 1 .  TURMERIC PO, Take 1 tablet by mouth daily. , Disp: , Rfl:   Allergies  Allergen Reactions  . Nitrofurantoin Monohyd Macro     I personally reviewed active problem list, medication list, allergies, family history, social history, health maintenance, notes from last encounter, lab results with the patient/caregiver today.  Review of Systems  Constitutional: Negative.   HENT: Negative.   Eyes: Negative.   Respiratory: Negative.   Cardiovascular: Negative.   Gastrointestinal: Negative.   Endocrine: Negative.   Genitourinary: Negative.   Musculoskeletal: Negative.   Skin: Negative.   Allergic/Immunologic: Negative.   Neurological: Negative.   Hematological: Negative.   Psychiatric/Behavioral: Negative.   All other systems reviewed and are negative.    Objective:    Vitals:   08/17/19 1110  BP: (!) 142/78  Pulse: 62  Resp: 16  Temp: (!) 96.9 F (36.1 C)  SpO2: 98%  Weight: 211 lb (95.7 kg)  Height: 5\' 9"  (1.753 m)    Body mass index is 31.16 kg/m.  Physical Exam Vitals signs and nursing note reviewed.  Constitutional:      General: She is not in acute distress.    Appearance: Normal appearance. She is well-developed. She is not toxic-appearing or diaphoretic.  HENT:     Head: Normocephalic and atraumatic.     Right Ear: Tympanic membrane, ear canal and external ear normal.     Left Ear: Tympanic membrane, ear canal and external ear normal.     Nose: Congestion and  rhinorrhea present.     Mouth/Throat:     Mouth: Mucous membranes are moist.     Pharynx: Oropharynx is clear. Uvula midline. No oropharyngeal exudate.  Eyes:     General: Lids are normal. No scleral icterus.    Conjunctiva/sclera: Conjunctivae normal.     Pupils: Pupils are equal, round, and reactive to light.  Neck:     Musculoskeletal: Normal range of motion and neck supple.     Trachea: Phonation normal. No tracheal deviation.  Cardiovascular:     Rate and Rhythm: Normal rate and regular rhythm.     Pulses: Normal pulses.          Radial pulses are 2+ on the right side and 2+ on the left side.       Posterior tibial pulses are 2+ on the right side and 2+ on the left side.     Heart sounds: Normal heart sounds. No murmur. No friction rub. No gallop.   Pulmonary:     Effort: Pulmonary effort is normal. No respiratory distress.     Breath sounds: Normal breath sounds. No stridor. No wheezing, rhonchi or rales.  Chest:     Chest wall: No tenderness.  Abdominal:     General: Bowel sounds are normal. There is no distension.     Palpations: Abdomen is soft.     Tenderness: There is no abdominal tenderness. There is no guarding or rebound.  Musculoskeletal: Normal range of motion.        General: No deformity.     Right lower leg: No edema.     Left lower leg: No edema.  Lymphadenopathy:     Cervical: No cervical adenopathy.  Skin:    General: Skin is warm and dry.     Capillary Refill: Capillary refill takes less than 2 seconds.     Coloration: Skin is not jaundiced or pale.     Findings: No  rash.  Neurological:     Mental Status: She is alert and oriented to person, place, and time.     Motor: No abnormal muscle tone.     Gait: Gait normal.  Psychiatric:        Mood and Affect: Mood normal.        Speech: Speech normal.        Behavior: Behavior normal.      Recent Results (from the past 2160 hour(s))  Basic metabolic panel     Status: Abnormal   Collection Time:  06/28/19 11:01 AM  Result Value Ref Range   Sodium 139 135 - 145 mmol/L   Potassium 3.7 3.5 - 5.1 mmol/L   Chloride 105 98 - 111 mmol/L   CO2 26 22 - 32 mmol/L   Glucose, Bld 120 (H) 70 - 99 mg/dL   BUN 16 8 - 23 mg/dL   Creatinine, Ser 0.89 0.44 - 1.00 mg/dL   Calcium 8.9 8.9 - 10.3 mg/dL   GFR calc non Af Amer >60 >60 mL/min   GFR calc Af Amer >60 >60 mL/min   Anion gap 8 5 - 15    Comment: Performed at [facility], [address] [place], [place] [address]  CBC     Status: None   Collection Time: 06/28/19 11:01 AM  Result Value Ref Range   WBC 6.2 4.0 - 10.5 K/uL   RBC 5.00 3.87 - 5.11 MIL/uL   Hemoglobin 13.2 12.0 - 15.0 g/dL   HCT 41.6 36.0 - 46.0 %   MCV 83.2 80.0 - 100.0 fL   MCH 26.4 26.0 - 34.0 pg   MCHC 31.7 30.0 - 36.0 g/dL   RDW 15.4 11.5 - 15.5 %   Platelets 235 150 - 400 K/uL   nRBC 0.0 0.0 - 0.2 %    Comment: Performed at [facility], [place], [place] [address]  Troponin I (High Sensitivity)     Status: None   Collection Time: 06/28/19 11:01 AM  Result Value Ref Range   Troponin I (High Sensitivity) 5 <18 ng/L    Comment: (NOTE) Elevated high sensitivity troponin I (hsTnI) values and significant  changes across serial measurements may suggest ACS but many other  chronic and acute conditions are known to elevate hsTnI results.  Refer to the "Links" section for chest pain algorithms and additional  guidance. Performed at [facility], [address] [place],  [address]   POCT urinalysis dipstick     Status: Abnormal   Collection Time: 07/03/19  9:09 AM  Result Value Ref Range   Color, UA Dark yellow    Clarity, UA clear    Glucose, UA Negative Negative   Bilirubin, UA negative    Ketones, UA negative    Spec Grav, UA 1.015 1.010 - 1.025   Blood, UA Trace    pH, UA 5.0 5.0 - 8.0   Protein, UA Negative Negative   Urobilinogen, UA negative (A) 0.2 or 1.0 E.U./dL   Nitrite, UA Negative     Leukocytes, UA Negative Negative   Appearance     Odor    Urinalysis, Routine w reflex microscopic     Status: None   Collection Time: 07/03/19  9:44 AM  Result Value Ref Range   Color, Urine YELLOW YELLOW   APPearance CLEAR CLEAR   Specific Gravity, Urine 1.016 1.001 - 1.03   pH < OR = 5.0 5.0 - 8.0   Glucose, UA  NEGATIVE NEGATIVE   Bilirubin Urine NEGATIVE NEGATIVE   Ketones, ur NEGATIVE NEGATIVE   Hgb urine dipstick NEGATIVE NEGATIVE   Protein, ur NEGATIVE NEGATIVE   Nitrite NEGATIVE NEGATIVE   Leukocytes,Ua NEGATIVE NEGATIVE    Diabetic Foot Exam: Diabetic Foot Exam - Simple   No data filed       PHQ2/9: Depression screen [facility] 2/9 08/17/2019 07/03/2019 05/03/2019 10/20/2018 10/13/2018  Decreased Interest 0 0 0 0 0  Down, Depressed, Hopeless - 0 1 0 0  PHQ - 2 Score 0 0 1 0 0  Altered sleeping - 0 0 0 0  Tired, decreased energy - 0 0 0 0  Change in appetite - 0 0 0 0  Feeling bad or failure about yourself  - 0 0 0 0  Trouble concentrating - 0 0 0 0  Moving slowly or fidgety/restless - 0 0 0 0  Suicidal thoughts - 0 0 0 0  PHQ-9 Score - 0 1 0 0  Difficult doing work/chores - Not difficult at all Not difficult at all Not difficult at all Not difficult at all    phq 9 is negative Reviewed today  Fall Risk: Fall Risk  08/17/2019 07/03/2019 05/03/2019 03/28/2019 10/20/2018  Falls in the past year? 0 0 0 0 1  Number falls in past yr: 0 0 - - 0  Injury with Fall? 0 0 - - 0  Risk for fall due to : - - - - -  Risk for fall due to: Comment - - - - -  Follow up Falls prevention discussed - - - -     Assessment & Plan:    Problem List Items Addressed This Visit      Cardiovascular and Mediastinum   Hypertension goal BP (blood pressure) < 150/90    On half dose of 100-12.5 losartan-HCTZ No SE, excellent compliance Check labs today BP a little high today, will monitor at home and call if > 140/90, can increase meds if persistently elevated      Relevant Orders    COMPLETE METABOLIC PANEL WITH GFR     Respiratory   Reactive airways dysfunction syndrome ([facility])    Morning congestion with some coughing to clear throat, not using inhaler now and denies SOB, wheeze CP, chest tightness No hx of asthma no smoking hx  Currently suspect any coughing is from post nasal drip from AR        Digestive   Gastro-esophageal reflux disease without esophagitis    On PPI PRN, improving with meds      Irritable bowel syndrome with diarrhea    Currently no constipation and no diarrhea, but some gassiness and bloating/rumbling        Other   Prediabetes - Primary   Relevant Orders   COMPLETE METABOLIC PANEL WITH GFR   Hemoglobin A1c    Other Visit Diagnoses    Dyslipidemia    - recheck lipids, risk calculator indicates statin   Relevant Orders   COMPLETE METABOLIC PANEL WITH GFR   Lipid panel      AR - seasonal allergies with post nasal drip, tx with daily zyrtec and flonase  Return in about 3 months (around 11/16/2019) for Routine follow-up.   Delsa Grana, PA-C 08/17/19 11:25 AM

## 2019-08-18 LAB — COMPLETE METABOLIC PANEL WITH GFR
AG Ratio: 1.3 (calc) (ref 1.0–2.5)
ALT: 14 U/L (ref 6–29)
AST: 14 U/L (ref 10–35)
Albumin: 3.9 g/dL (ref 3.6–5.1)
Alkaline phosphatase (APISO): 93 U/L (ref 37–153)
BUN: 16 mg/dL (ref 7–25)
CO2: 29 mmol/L (ref 20–32)
Calcium: 9.9 mg/dL (ref 8.6–10.4)
Chloride: 105 mmol/L (ref 98–110)
Creat: 0.86 mg/dL (ref 0.60–0.93)
GFR, Est African American: 79 mL/min/{1.73_m2} (ref >=60)
GFR, Est Non African American: 68 mL/min/{1.73_m2} (ref >=60)
Globulin: 3 g/dL (calc) (ref 1.9–3.7)
Glucose, Bld: 98 mg/dL (ref 65–99)
Potassium: 4.1 mmol/L (ref 3.5–5.3)
Sodium: 141 mmol/L (ref 135–146)
Total Bilirubin: 0.5 mg/dL (ref 0.2–1.2)
Total Protein: 6.9 g/dL (ref 6.1–8.1)

## 2019-08-18 LAB — LIPID PANEL
Cholesterol: 158 mg/dL (ref <200)
HDL: 53 mg/dL (ref >=50)
LDL Cholesterol (Calc): 78 mg/dL (calc)
Non-HDL Cholesterol (Calc): 105 mg/dL (calc) (ref <130)
Total CHOL/HDL Ratio: 3 (calc) (ref <5.0)
Triglycerides: 175 mg/dL — ABNORMAL HIGH (ref <150)

## 2019-08-18 LAB — HEMOGLOBIN A1C
Hgb A1c MFr Bld: 5.6 % of total Hgb (ref <5.7)
Mean Plasma Glucose: 114 (calc)
eAG (mmol/L): 6.3 (calc)

## 2019-08-30 ENCOUNTER — Telehealth: Payer: Medicare HMO

## 2019-09-04 ENCOUNTER — Encounter: Payer: Self-pay | Admitting: Cardiovascular Disease

## 2019-09-05 ENCOUNTER — Ambulatory Visit: Payer: Medicare HMO

## 2019-09-07 ENCOUNTER — Other Ambulatory Visit: Payer: Self-pay

## 2019-09-07 ENCOUNTER — Ambulatory Visit
Admission: RE | Admit: 2019-09-07 | Discharge: 2019-09-07 | Disposition: A | Discharge status: Home / Self Care | Payer: Medicare HMO | Source: Ambulatory Visit | Attending: Oncology | Admitting: Oncology

## 2019-09-07 ENCOUNTER — Inpatient Hospital Stay: Payer: Medicare HMO | Attending: Oncology | Admitting: Oncology

## 2019-09-07 DIAGNOSIS — Z79899 Other long term (current) drug therapy: Secondary | ICD-10-CM | POA: Insufficient documentation

## 2019-09-07 DIAGNOSIS — Z7951 Long term (current) use of inhaled steroids: Secondary | ICD-10-CM | POA: Diagnosis not present

## 2019-09-07 DIAGNOSIS — K219 Gastro-esophageal reflux disease without esophagitis: Secondary | ICD-10-CM | POA: Diagnosis not present

## 2019-09-07 DIAGNOSIS — Z8249 Family history of ischemic heart disease and other diseases of the circulatory system: Secondary | ICD-10-CM | POA: Diagnosis not present

## 2019-09-07 DIAGNOSIS — R911 Solitary pulmonary nodule: Secondary | ICD-10-CM

## 2019-09-07 DIAGNOSIS — R918 Other nonspecific abnormal finding of lung field: Secondary | ICD-10-CM | POA: Diagnosis not present

## 2019-09-07 DIAGNOSIS — Z833 Family history of diabetes mellitus: Secondary | ICD-10-CM | POA: Diagnosis not present

## 2019-09-07 DIAGNOSIS — I10 Essential (primary) hypertension: Secondary | ICD-10-CM | POA: Insufficient documentation

## 2019-09-07 DIAGNOSIS — Z7982 Long term (current) use of aspirin: Secondary | ICD-10-CM | POA: Diagnosis not present

## 2019-09-07 NOTE — Progress Notes (Signed)
Pulmonary Nodule Clinic Consult note [facility]  Telephone:([phone] [phone]  Patient Care Team: Delsa Grana, PA-C as PCP - General (Family Medicine) Lahoma Rocker, MD as Consulting Physician (Rheumatology) Minna Merritts, MD as Consulting Physician (Cardiology) Neldon Labella, RN as Case Manager   Name of the patient: Ruth Bishop  [id number]  20-Mar-1946   Date of visit: 09/07/2019   Diagnosis- Lung Nodule  Virtual Visit via Telephone Note   I connected with Ruth Bishop on 10/16/19 at 11 am by telephone visit and verified that I am speaking with the correct person using two identifiers.   I discussed the limitations, risks, security and privacy concerns of performing an evaluation and management service by telemedicine and the availability of in-person appointments. I also discussed with the patient that there may be a patient responsible charge related to this service. The patient expressed understanding and agreed to proceed.   Other persons participating in the visit and their role in the encounter:   Patient's location: Home  Provider's location: Office  Chief complaint/ Reason for visit- Pulmonary Nodule Clinic Initial Visit  Past Medical History:  Patient is managed/referred by her PCP Dr. Sanda Klein.  Initial CT scan completed on 08/30/2018 revealed a part solid nodule within the medial aspect of the left upper lobe measuring 1.3 cm with a central solid component at 5 mm.  Previous imaging from 02/17/2018 and 08/18/2017 revealed stable subsolid left upper lobe nodule measuring 5 mm and 4 mm.  Per Fleischner's guidelines, CT scan with out contrast is recommended in 12 months and if stable repeat CT scan at 18-24 months to ensure stability.  Interval history-after epic chart review, Ruth Bishop has a past medical history of:  Past Medical History:  Diagnosis Date  . Abnormal chest x-ray 10/18/2018  . Arthritis   . Chronic pain  05/24/2015  . Diastolic dysfunction 99991111  . GERD (gastroesophageal reflux disease)   . Heart murmur   . Hormone replacement therapy 05/26/2016  . Hypertension   . Incidental lung nodule, > 23mm and < 77mm 08/04/2017   4 mm ground glass nodule LLL incidentally noted on CT scan Nov 2016  . Osteoarthritis of both knees 06/13/2015   [facility]   . Post menopausal syndrome 05/24/2015  . Skin-picking disorder 05/24/2015   when nervous/stressed chews on fingers and peels down skin, started with cuticles but now hypopigmentation of fingers down to palm in some areas with lifetime of skin picking.    She has a past surgical history of: Past Surgical History:  Procedure Laterality Date  . ABDOMINAL HYSTERECTOMY    . COLONOSCOPY WITH PROPOFOL N/A 10/01/2015   Procedure: COLONOSCOPY WITH PROPOFOL;  Surgeon: Lollie Sails, MD;  Location: [facility] ENDOSCOPY;  Service: Endoscopy;  Laterality: N/A;  . ESOPHAGOGASTRODUODENOSCOPY (EGD) WITH PROPOFOL N/A 10/01/2015   Procedure: ESOPHAGOGASTRODUODENOSCOPY (EGD) WITH PROPOFOL;  Surgeon: Lollie Sails, MD;  Location: [facility] ENDOSCOPY;  Service: Endoscopy;  Laterality: N/A;   She currently does not smoke and has never smoked or used smokeless tobacco.  She is retired.  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  She lives at home alone.  She is currently divorced.  She has 1 daughter and 1 granddaughter.  She does not know much about her past family history.  She was adopted and lives at with her adopted family.  She does remember that her dad died of "cardiac problem" and her mom died  of an "aneurysm".  She believes her aunt had uterine cancer but otherwise does not know of additional cancers.  In the interim, she has been doing well.  She denies any shortness of breath, cough or chest pain.  She denies any recent fevers or illness, easy bleeding or bruising, reports a good appetite and denies any weight  loss.  She denies any nausea vomiting constipation or diarrhea.  She denies any urinary concerns.  ECOG FS:0 - Asymptomatic  Review of systems- Review of Systems  Constitutional: Negative.  Negative for chills, fever, malaise/fatigue and weight loss.  HENT: Negative for congestion, ear pain and tinnitus.   Eyes: Negative.  Negative for blurred vision and double vision.  Respiratory: Negative.  Negative for cough, sputum production and shortness of breath.   Cardiovascular: Negative.  Negative for chest pain, palpitations and leg swelling.  Gastrointestinal: Negative.  Negative for abdominal pain, constipation, diarrhea, nausea and vomiting.  Genitourinary: Negative for dysuria, frequency and urgency.  Musculoskeletal: Negative for back pain and falls.  Skin: Negative.  Negative for rash.  Neurological: Negative.  Negative for weakness and headaches.  Endo/Heme/Allergies: Negative.  Does not bruise/bleed easily.  Psychiatric/Behavioral: Negative.  Negative for depression. The patient is not nervous/anxious and does not have insomnia.      Allergies  Allergen Reactions  . Nitrofurantoin Monohyd Macro      Past Medical History:  Diagnosis Date  . Abnormal chest x-ray 10/18/2018  . Arthritis   . Chronic pain 05/24/2015  . Diastolic dysfunction 99991111  . GERD (gastroesophageal reflux disease)   . Heart murmur   . Hormone replacement therapy 05/26/2016  . Hypertension   . Incidental lung nodule, > 66mm and < 85mm 08/04/2017   4 mm ground glass nodule LLL incidentally noted on CT scan Nov 2016  . Osteoarthritis of both knees 06/13/2015   [facility]   . Post menopausal syndrome 05/24/2015  . Skin-picking disorder 05/24/2015   when nervous/stressed chews on fingers and peels down skin, started with cuticles but now hypopigmentation of fingers down to palm in some areas with lifetime of skin picking.      Past Surgical History:  Procedure Laterality Date  . ABDOMINAL HYSTERECTOMY     . COLONOSCOPY WITH PROPOFOL N/A 10/01/2015   Procedure: COLONOSCOPY WITH PROPOFOL;  Surgeon: Lollie Sails, MD;  Location: [facility] ENDOSCOPY;  Service: Endoscopy;  Laterality: N/A;  . ESOPHAGOGASTRODUODENOSCOPY (EGD) WITH PROPOFOL N/A 10/01/2015   Procedure: ESOPHAGOGASTRODUODENOSCOPY (EGD) WITH PROPOFOL;  Surgeon: Lollie Sails, MD;  Location: [facility] ENDOSCOPY;  Service: Endoscopy;  Laterality: N/A;    Social History   Socioeconomic History  . Marital status: Widowed    Spouse name: Not on file  . Number of children: 1  . Years of education: Not on file  . Highest education level: Master's degree (e.g., MA, MS, MEng, MEd, MSW, MBA)  Occupational History  . Occupation: Retired  Scientific laboratory technician  . Financial resource strain: Not hard at all  . Food insecurity    Worry: Never true    Inability: Never true  . Transportation needs    Medical: No    Non-medical: No  Tobacco Use  . Smoking status: Never Smoker  . Smokeless tobacco: Never Used  . Tobacco comment: smoking cessation materials not required  Substance and Sexual Activity  . Alcohol use: Not Currently    Alcohol/week: 0.0 standard drinks  . Drug use: No  . Sexual activity: Not Currently  Lifestyle  . Physical activity  Days per week: 4 days    Minutes per session: 30 min  . Stress: Only a little  Relationships  . Social Herbalist on phone: Three times a week    Gets together: Once a week    Attends religious service: More than 4 times per year    Active member of club or organization: No    Attends meetings of clubs or organizations: Never    Relationship status: Widowed  . Intimate partner violence    Fear of current or ex partner: No    Emotionally abused: No    Physically abused: No    Forced sexual activity: No  Other Topics Concern  . Not on file  Social History Narrative  . Not on file    Family History  Problem Relation Age of Onset  . Heart disease Mother   . Diabetes Mother    . Aneurysm Mother   . Heart disease Father   . Heart attack Father   . Heart disease Sister   . Diabetes Sister   . Pneumonia Sister   . Heart disease Brother   . Diabetes Brother   . Diabetes Sister   . Heart disease Sister   . Congestive Heart Failure Sister   . Breast cancer Neg Hx      Current Outpatient Medications:  .  aspirin EC 81 MG tablet, Take 81 mg by mouth daily., Disp: , Rfl:  .  budesonide-formoterol (SYMBICORT) 160-4.5 MCG/ACT inhaler, Inhale 2 puffs into the lungs 2 (two) times daily., Disp: , Rfl:  .  Calcium Carbonate-Vitamin D (CALTRATE 600+D) 600-400 MG-UNIT per tablet, Take 1 tablet by mouth daily. , Disp: , Rfl:  .  Cetirizine HCl 10 MG CAPS, Take 1 capsule (10 mg total) by mouth daily., Disp: 30 capsule, Rfl: 0 .  docusate sodium (COLACE) 250 MG capsule, Take 250 mg by mouth daily., Disp: , Rfl:  .  famotidine (PEPCID) 20 MG tablet, Take 1 tablet (20 mg total) by mouth 2 (two) times daily as needed for heartburn or indigestion., Disp: 60 tablet, Rfl: 2 .  Hyoscyamine Sulfate 0.375 MG TBCR, Take 1 tablet (0.375 mg total) by mouth 2 (two) times daily as needed., Disp: 100 each, Rfl: 1 .  losartan-hydrochlorothiazide (HYZAAR) 100-12.5 MG tablet, Take 0.5 tablets by mouth daily., Disp: 45 tablet, Rfl: 1 .  MULTIPLE VITAMIN PO, Take 1 tablet by mouth daily. , Disp: , Rfl:  .  pantoprazole (PROTONIX) 40 MG tablet, Take 1 tablet (40 mg total) by mouth daily as needed., Disp: 30 tablet, Rfl: 1 .  psyllium (METAMUCIL) 0.52 g capsule, Take 0.52 g by mouth daily., Disp: , Rfl:  .  triamcinolone cream (KENALOG) 0.1 %, Apply 1 application topically 2 (two) times daily. If needed, Disp: 45 g, Rfl: 1 .  TURMERIC PO, Take 1 tablet by mouth daily. , Disp: , Rfl:   Physical exam: There were no vitals filed for this visit. Limited due to telephone call.  CMP Latest Ref Rng & Units 08/17/2019  Glucose 65 - 99 mg/dL 98  BUN 7 - 25 mg/dL 16  Creatinine 0.60 - 0.93 mg/dL 0.86   Sodium 135 - 146 mmol/L 141  Potassium 3.5 - 5.3 mmol/L 4.1  Chloride 98 - 110 mmol/L 105  CO2 20 - 32 mmol/L 29  Calcium 8.6 - 10.4 mg/dL 9.9  Total Protein 6.1 - 8.1 g/dL 6.9  Total Bilirubin 0.2 - 1.2 mg/dL 0.5  Alkaline Phos 33 -  130 U/L -  AST 10 - 35 U/L 14  ALT 6 - 29 U/L 14   CBC Latest Ref Rng & Units 06/28/2019  WBC 4.0 - 10.5 K/uL 6.2  Hemoglobin 12.0 - 15.0 g/dL 13.2  Hematocrit 36.0 - 46.0 % 41.6  Platelets 150 - 400 K/uL 235    No images are attached to the encounter.  Mm 3d Screen Breast Bilateral  Result Date: 09/27/2019 CLINICAL DATA:  Screening. EXAM: DIGITAL SCREENING BILATERAL MAMMOGRAM WITH TOMO AND CAD COMPARISON:  Previous exam(s). ACR Breast Density Category b: There are scattered areas of fibroglandular density. FINDINGS: There are no findings suspicious for malignancy. Images were processed with CAD. IMPRESSION: No mammographic evidence of malignancy. A result letter of this screening mammogram will be mailed directly to the patient. RECOMMENDATION: Screening mammogram in one year. (Code:SM-B-01Y) BI-RADS CATEGORY  1: Negative. Electronically Signed   By: Ammie Ferrier M.D.   On: 09/27/2019 14:15   Assessment and plan- Patient is a 73 y.o. female who presents to pulmonary nodule clinic for follow-up of incidental lung nodules.  A telephone visit was conducted to review most recent CT scan results.    CT chest without contrast from today revealed stable part solid nodule in left upper lobe.  Other scattered small nodules are stable.  Follow-up noncontrasted CT recommended in 12 months to confirm persistent.  If unchanged and solid component remains less than 6 mm, annual CT is recommended until 5 years of stability has been established.  If persistent these nodules should be considered highly suspicious at the solid component of the nodule a 6 mm or greater in size and enlarging.    Calculating malignancy probability of a pulmonary nodule: Risk factors  include: 1.  Age. 2.  Cancer history. 3.  Diameter of pulmonary nodule and mm 4.  Location 5.  Smoking history 6.  Spiculation present   Based on risk factors, this patient is Moderate risk for the development of lung cancer given the characteristics of the nodule, location and size. (63mm solid nodule in left upper lobe).  I would recommend a 50-month follow-up with imaging to ensure stability.    During our visit, we discussed pulmonary nodules are a common incidental finding and are often how lung cancer is discovered.  Lung cancer survival is directly related to the stage at diagnosis.  We discussed that nodules can vary in presentation from solitary pulmonary nodules to masses, 2 groundglass opacities and multiple nodules.  Pulmonary nodules in the majority of cases are benign but the probability of these becoming malignant cannot be undermined.  Early identification of malignant nodules could lead to early diagnosis and increased survival.   We discussed the probability of pulmonary nodules becoming malignant increase with age, pack years of tobacco use, size/characteristics of the nodule and location; with upper lobe involvement being most worrisome.   We discussed the goal of our clinic is to thoroughly evaluate each nodule, developed a comprehensive, individualized plan of care utilizing the most advanced technology and significantly reduce the time from detection to treatment.  A dedicated pulmonary nodule clinic has proven to indeed expedite the detection and treatment of lung cancer.   Patient education in fact sheet provided along with most recent CT scans.   Disposition: CT scan 1 year. Orders placed and scheduled.  RTC in one year for results.    Visit Diagnosis 1. Pulmonary nodule     Patient expressed understanding and was in agreement with this plan. She also  understands that She can call clinic at any time with any questions, concerns, or complaints.   Greater than 50%  was spent in counseling and coordination of care with this patient including but not limited to discussion of the relevant topics above (See A&P) including, but not limited to diagnosis and management of acute and chronic medical conditions.   Thank you for allowing me to participate in the care of this very pleasant patient.    Jacquelin Hawking, NP [place] [facility] Cell - [phone] Pager- [phone] 10/16/2019 1:45 PM

## 2019-09-14 ENCOUNTER — Other Ambulatory Visit: Payer: Self-pay | Admitting: Oncology

## 2019-09-14 DIAGNOSIS — R911 Solitary pulmonary nodule: Secondary | ICD-10-CM

## 2019-09-14 NOTE — Progress Notes (Signed)
CT chest orders placed for 12 month follow-up.   Faythe Casa, NP 09/14/2019 1:05 PM

## 2019-09-18 ENCOUNTER — Other Ambulatory Visit: Payer: Self-pay

## 2019-09-18 ENCOUNTER — Ambulatory Visit (INDEPENDENT_AMBULATORY_CARE_PROVIDER_SITE_OTHER): Payer: Medicare HMO | Admitting: *Deleted

## 2019-09-18 DIAGNOSIS — M8949 Other hypertrophic osteoarthropathy, multiple sites: Secondary | ICD-10-CM | POA: Diagnosis not present

## 2019-09-18 DIAGNOSIS — I10 Essential (primary) hypertension: Secondary | ICD-10-CM

## 2019-09-18 DIAGNOSIS — M159 Polyosteoarthritis, unspecified: Secondary | ICD-10-CM

## 2019-09-18 NOTE — Chronic Care Management (AMB) (Signed)
Chronic Care Management   Initial Visit Note  09/18/2019 Name: Ruth Bishop MRN: [id number] DOB: 01-24-1946  Referred by: Delsa Grana, PA-C Reason for referral : Chronic Care Management (Initial CCM Visit HTN, Osteo multiple joints )   Ruth Bishop is a 73 y.o. year old female who is a primary care patient of Delsa Grana, [place]. The CCM team was consulted for assistance with chronic disease management and care coordination needs related to HTN and Osteo arthritis several joints, pre-diabetes  Review of patient status, including review of consultants reports, relevant laboratory and other test results, and collaboration with appropriate care team members and the patient's provider was performed as part of comprehensive patient evaluation and provision of chronic care management services.    SDOH (Social Determinants of Health) screening performed today: None. See Care Plan for related entries.   Advanced Directives Status: N See Care Plan and Vynca application for related entries.   Medications: Outpatient Encounter Medications as of 09/18/2019  Medication Sig Note  . aspirin EC 81 MG tablet Take 81 mg by mouth daily.   . budesonide-formoterol (SYMBICORT) 160-4.5 MCG/ACT inhaler Inhale 2 puffs into the lungs 2 (two) times daily. 09/18/2019: "take it if I need it.Marland Kitchenif I have a cough or mucous in my throat...its very rare that I take it"  . Calcium Carbonate-Vitamin D (CALTRATE 600+D) 600-400 MG-UNIT per tablet Take 1 tablet by mouth daily.  05/24/2015: Received from: [facility]  . Cetirizine HCl 10 MG CAPS Take 1 capsule (10 mg total) by mouth daily. 09/18/2019: Takes prn  . docusate sodium (COLACE) 250 MG capsule Take 250 mg by mouth daily.   . famotidine (PEPCID) 20 MG tablet Take 1 tablet (20 mg total) by mouth 2 (two) times daily as needed for heartburn or indigestion. 09/18/2019: Taking differently - "very rare when I need it"  . Hyoscyamine Sulfate 0.375 MG TBCR  Take 1 tablet (0.375 mg total) by mouth 2 (two) times daily as needed.   Marland Kitchen losartan-hydrochlorothiazide (HYZAAR) 100-12.5 MG tablet TAKE 1/2 TABLET EVERY DAY   . MULTIPLE VITAMIN PO Take 1 tablet by mouth daily.  05/24/2015: Received from: [facility]  . pantoprazole (PROTONIX) 40 MG tablet Take 1 tablet (40 mg total) by mouth daily as needed.   . psyllium (METAMUCIL) 0.52 g capsule Take 0.52 g by mouth daily.   . TURMERIC PO Take 1 tablet by mouth daily.    Marland Kitchen triamcinolone cream (KENALOG) 0.1 % Apply 1 application topically 2 (two) times daily. If needed (Patient not taking: Reported on 09/18/2019) 09/18/2019: Has on hand; not taking; usually uses during summer   No facility-administered encounter medications on file as of 09/18/2019.      Objective:  Lab Results  Component Value Date   HGBA1C 5.6 08/17/2019   HGBA1C 5.8 (H) 10/13/2018   HGBA1C 5.4 08/04/2017   Lab Results  Component Value Date   LDLCALC 78 08/17/2019   CREATININE 0.86 08/17/2019   BP Readings from Last 3 Encounters:  08/17/19 (!) 142/78  08/17/19 (!) 142/78  07/03/19 122/76   Wt Readings from Last 3 Encounters:  08/17/19 211 lb (95.7 kg)  08/17/19 211 lb 6.4 oz (95.9 kg)  07/03/19 209 lb 8 oz (95 kg)    Goals Addressed            This Visit's Progress   . "I would like to lose weight - down to under 200#" (pt-stated)       Current Barriers:  .  Knowledge Deficits related to plan of care for weight management in patient with pre-diabetes, HTN, and osteoarthritis of multiple joints  Nurse Case Manager Clinical Goal(s):  Marland Kitchen Over the next 60 days, patient will verbalize understanding of plan for weight loss and maintenance . Over the next 60 days, patient will work with [facility] to address needs related to weight loss and maintenance  Interventions:  . Evaluation of current treatment plan related to chronic management of HTN and pre-diabetes and patient's adherence to plan as established by  provider. Discussed value of weight loss in management of chronic disease states.  . Advised patient to continue exercise regimen.  . Reviewed medications with patient and discussed importance of adherence to prescribed medications.  Ruth Bishop with [place] colleague regarding weight loss and weight management strategies . Discussed plans with patient for ongoing care management follow up and provided patient with direct contact information for care management team  Patient Self Care Activities:  . Self administers medications as prescribed . Attends all scheduled provider appointments . Calls pharmacy for medication refills . Attends church or other social activities . Performs ADL's independently . Performs IADL's independently . Calls provider office for new concerns or questions . Unable to independently lose desired weight without assistance from health care team  Initial goal documentation     . BP Management       Current Barriers:  Marland Kitchen Knowledge Deficits related to long term management of HTN. Patient reports having BP machine at home but does not check and record routinely.   Nurse Case Manager Clinical Goal(s):  Marland Kitchen Over the next 30 days, patient will verbalize understanding of plan for long term self health management of HTN . Over the next 60 days, patient will work with [facility] to address needs related to Hypertension management . Over the next 60 days, patient will demonstrate improved health management independence as evidenced byself monitoring/recording, verbalization of understanding of when to call provider with bp findings outside established parameters  Interventions:  . Advised patient to monitor and record BP twice weekly at different times of day over the next 3 weeks . Reviewed medications with patient and discussed importance of adherence to prescribed regimen . Discussed plans with patient for ongoing care management follow up and provided patient with direct  contact information for care management team  Patient Self Care Activities:  . Self administers medications as prescribed . Attends all scheduled provider appointments . Calls pharmacy for medication refills . Attends church or other social activities . Performs ADL's independently . Performs IADL's independently . Calls provider office for new concerns or questions . Unable to independently verbalize understanding of long term plan for self health management of HTN  Initial goal documentation        Ruth Bishop was given information about Chronic Care Management services today including:  1. CCM service includes personalized support from designated clinical staff supervised by her physician, including individualized plan of care and coordination with other care providers 2. 24/7 contact phone numbers for assistance for urgent and routine care needs. 3. Service will only be billed when office clinical staff spend 20 minutes or more in a month to coordinate care. 4. Only one practitioner may furnish and bill the service in a calendar month. 5. The patient may stop CCM services at any time (effective at the end of the month) by phone call to the office staff. 6. The patient will be responsible for cost sharing (co-pay) of up to 20% of  the service fee (after annual deductible is met).  Patient agreed to services and verbal consent obtained.   Plan:   The care management team will reach out to the patient again over the next 21 days.   [facility] [place] [facility]  [phone]

## 2019-09-18 NOTE — Patient Instructions (Signed)
Visit Information  Goals Addressed            This Visit's Progress   . "I would like to lose weight - down to under 200#" (pt-stated)       Current Barriers:  Marland Kitchen Knowledge Deficits related to plan of care for weight management in patient with pre-diabetes, HTN, and osteoarthritis of multiple joints  Nurse Case Manager Clinical Goal(s):  Marland Kitchen Over the next 60 days, patient will verbalize understanding of plan for weight loss and maintenance . Over the next 60 days, patient will work with [facility] to address needs related to weight loss and maintenance  Interventions:  . Evaluation of current treatment plan related to chronic management of HTN and pre-diabetes and patient's adherence to plan as established by provider. Discussed value of weight loss in management of chronic disease states.  . Advised patient to continue exercise regimen.  . Reviewed medications with patient and discussed importance of adherence to prescribed medications.  Nash Dimmer with [place] colleague regarding weight loss and weight management strategies . Discussed plans with patient for ongoing care management follow up and provided patient with direct contact information for care management team  Patient Self Care Activities:  . Self administers medications as prescribed . Attends all scheduled provider appointments . Calls pharmacy for medication refills . Attends church or other social activities . Performs ADL's independently . Performs IADL's independently . Calls provider office for new concerns or questions . Unable to independently lose desired weight without assistance from health care team  Initial goal documentation     . BP Management       Current Barriers:  Marland Kitchen Knowledge Deficits related to long term management of HTN. Patient reports having BP machine at home but does not check and record routinely.   Nurse Case Manager Clinical Goal(s):  Marland Kitchen Over the next 30 days, patient will verbalize  understanding of plan for long term self health management of HTN . Over the next 60 days, patient will work with [facility] to address needs related to Hypertension management . Over the next 60 days, patient will demonstrate improved health management independence as evidenced byself monitoring/recording, verbalization of understanding of when to call provider with bp findings outside established parameters  Interventions:  . Advised patient to monitor and record BP twice weekly at different times of day over the next 3 weeks . Reviewed medications with patient and discussed importance of adherence to prescribed regimen . Discussed plans with patient for ongoing care management follow up and provided patient with direct contact information for care management team  Patient Self Care Activities:  . Self administers medications as prescribed . Attends all scheduled provider appointments . Calls pharmacy for medication refills . Attends church or other social activities . Performs ADL's independently . Performs IADL's independently . Calls provider office for new concerns or questions . Unable to independently verbalize understanding of long term plan for self health management of HTN  Initial goal documentation     . COMPLETED: DIET - INCREASE WATER INTAKE       Recommend to drink at least 6-8 8oz glasses of water per day.       Ms. Digeronimo was given information about Chronic Care Management services today including:  1. CCM service includes personalized support from designated clinical staff supervised by her physician, including individualized plan of care and coordination with other care providers 2. 24/7 contact phone numbers for assistance for urgent and routine care needs. 3. Service will  only be billed when office clinical staff spend 20 minutes or more in a month to coordinate care. 4. Only one practitioner may furnish and bill the service in a calendar month. 5. The patient may stop  CCM services at any time (effective at the end of the month) by phone call to the office staff. 6. The patient will be responsible for cost sharing (co-pay) of up to 20% of the service fee (after annual deductible is met).  Patient agreed to services and verbal consent obtained.   The patient verbalized understanding of instructions provided today and declined a print copy of patient instruction materials.   The care management team will reach out to the patient again over the next 21 days.   [facility] [place] [facility]  [phone]

## 2019-09-27 ENCOUNTER — Ambulatory Visit
Admission: RE | Admit: 2019-09-27 | Discharge: 2019-09-27 | Disposition: A | Discharge status: Home / Self Care | Payer: Medicare HMO | Source: Ambulatory Visit | Attending: Family Medicine | Admitting: Family Medicine

## 2019-09-27 DIAGNOSIS — Z1231 Encounter for screening mammogram for malignant neoplasm of breast: Secondary | ICD-10-CM | POA: Insufficient documentation

## 2019-10-04 ENCOUNTER — Telehealth: Payer: Self-pay | Admitting: *Deleted

## 2019-10-04 NOTE — Telephone Encounter (Signed)
Pt made aware of upcoming appts for follow up CT scan and visit in the lung nodule clinic with Sonia Baller. Appts have been mailed to patient as well.

## 2019-10-09 ENCOUNTER — Ambulatory Visit (INDEPENDENT_AMBULATORY_CARE_PROVIDER_SITE_OTHER): Payer: Medicare HMO

## 2019-10-09 DIAGNOSIS — I10 Essential (primary) hypertension: Secondary | ICD-10-CM

## 2019-10-09 NOTE — Progress Notes (Signed)
Cardiology Office Note  Date:  10/10/2019   ID:  Ruth Bishop, DOB [birth year], MRN [id number]  PCP:  Delsa Grana, PA-C   Chief Complaint  Patient presents with  . Other    12 month follow up. Patient denies chest pain and SOB. Meds reviewed verbally with patient.     HPI:  [name] is a 73 year old woman with past medical history of HTN Fatigue Chest pain Referred by Dr. Sanda Klein for chest pain sx, costochondritis  Seen by rheumatology at [facility], then [place] Elevated CRP and inflammatory markers Reports symptoms are typically well controlled with heating pack, NSAIDs  Remote history of having epidural injection to her chest for pain relief nothing recent  Very active at baseline, plays tennis,  Single woman looking for companion  No chest pain or shortness of breath on exertion  CT scan images from 2010 reviewed showing no PAD, aortic atherosclerosis, no coronary calcification  EKG personally reviewed by myself on todays visit Shows normal sinus rhythm rate 61 bpm no significant ST-T wave changes   Previous lab work reviewed CRP elevated 13 ANA Titers elevated  CT scan 02/07/2018 No coronary calcifications, no aortic atherosclerosis  CT scan 08/2017, images pulled up in the office As above no coronary calcification or aortic atherosclerosis  Echo 10/2017 reviewed with her in detail Left ventricle: The cavity size was normal. Systolic function was normal. The estimated ejection fraction was in the range of 60% to 65%. Wall motion was normal; there were no regional wall motion abnormalities. Doppler parameters are consistent with abnormal left ventricular relaxation (grade 1 diastolic dysfunction). - Aortic valve: There was mild regurgitation. - Left atrium: The atrium was normal in size. - Right ventricle: Systolic function was normal. - Pulmonary arteries: Systolic pressure was within the normal   range.  Previous records have been requested, reviewed  today Echocardiogram from June 2014 was essentially normal ejection fraction 60-65%  PMH:   has a past medical history of Abnormal chest x-ray (10/18/2018), Arthritis, Chronic pain (99991111), Diastolic dysfunction (99991111), GERD (gastroesophageal reflux disease), Heart murmur, Hormone replacement therapy (05/26/2016), Hypertension, Incidental lung nodule, > 15mm and < 82mm (08/04/2017), Osteoarthritis of both knees (06/13/2015), Post menopausal syndrome (05/24/2015), and Skin-picking disorder (05/24/2015).  PSH:    Past Surgical History:  Procedure Laterality Date  . ABDOMINAL HYSTERECTOMY    . COLONOSCOPY WITH PROPOFOL N/A 10/01/2015   Procedure: COLONOSCOPY WITH PROPOFOL;  Surgeon: Lollie Sails, MD;  Location: [facility] ENDOSCOPY;  Service: Endoscopy;  Laterality: N/A;  . ESOPHAGOGASTRODUODENOSCOPY (EGD) WITH PROPOFOL N/A 10/01/2015   Procedure: ESOPHAGOGASTRODUODENOSCOPY (EGD) WITH PROPOFOL;  Surgeon: Lollie Sails, MD;  Location: [facility] ENDOSCOPY;  Service: Endoscopy;  Laterality: N/A;    Current Outpatient Medications  Medication Sig Dispense Refill  . aspirin EC 81 MG tablet Take 81 mg by mouth daily.    . budesonide-formoterol (SYMBICORT) 160-4.5 MCG/ACT inhaler Inhale 2 puffs into the lungs 2 (two) times daily.    . Calcium Carbonate-Vitamin D (CALTRATE 600+D) 600-400 MG-UNIT per tablet Take 1 tablet by mouth daily.     . Cetirizine HCl 10 MG CAPS Take 1 capsule (10 mg total) by mouth daily. 30 capsule 0  . docusate sodium (COLACE) 250 MG capsule Take 250 mg by mouth daily.    . famotidine (PEPCID) 20 MG tablet Take 1 tablet (20 mg total) by mouth 2 (two) times daily as needed for heartburn or indigestion. 60 tablet 2  . Hyoscyamine Sulfate 0.375 MG TBCR Take 1 tablet (0.375  mg total) by mouth 2 (two) times daily as needed. 100 each 1  . losartan-hydrochlorothiazide (HYZAAR) 100-12.5 MG tablet TAKE 1/2 TABLET EVERY DAY 45 tablet 1  . MULTIPLE VITAMIN PO Take 1 tablet by mouth daily.      . pantoprazole (PROTONIX) 40 MG tablet Take 1 tablet (40 mg total) by mouth daily as needed. 30 tablet 1  . psyllium (METAMUCIL) 0.52 g capsule Take 0.52 g by mouth daily.    Marland Kitchen triamcinolone cream (KENALOG) 0.1 % Apply 1 application topically 2 (two) times daily. If needed 45 g 1  . TURMERIC PO Take 1 tablet by mouth daily.      No current facility-administered medications for this visit.     Allergies:   Nitrofurantoin monohyd macro   Social History:  The patient  reports that she has never smoked. She has never used smokeless tobacco. She reports previous alcohol use. She reports that she does not use drugs.   Family History:   family history includes Aneurysm in her mother; Congestive Heart Failure in her sister; Diabetes in her brother, mother, sister, and sister; Heart attack in her father; Heart disease in her brother, father, mother, sister, and sister; Pneumonia in her sister.    Review of Systems: Review of Systems  Constitutional: Negative.   HENT: Negative.   Respiratory: Negative.   Cardiovascular: Negative.   Gastrointestinal: Negative.   Musculoskeletal: Negative.   Neurological: Negative.   Psychiatric/Behavioral: Negative.   All other systems reviewed and are negative.   PHYSICAL EXAM: VS:  BP 136/70 (BP Location: Left Arm, Patient Position: Sitting, Cuff Size: Normal)   Pulse 61   Ht 5\' 9"  (1.753 m)   Wt 213 lb (96.6 kg)   BMI 31.45 kg/m  , BMI Body mass index is 31.45 kg/m. GEN: Well nourished, well developed, in no acute distress  HEENT: normal  Neck: no JVD, carotid bruits, or masses Cardiac: RRR; no murmurs, rubs, or gallops,no edema  Respiratory:  clear to auscultation bilaterally, normal work of breathing GI: soft, nontender, nondistended, + BS MS: no deformity or atrophy  Skin: warm and dry, no rash Neuro:  Strength and sensation are intact Psych: euthymic mood, full affect   Recent Labs: 06/28/2019: Hemoglobin 13.2; Platelets 235 08/17/2019:  ALT 14; BUN 16; Creat 0.86; Potassium 4.1; Sodium 141    Lipid Panel Lab Results  Component Value Date   CHOL 158 08/17/2019   HDL 53 08/17/2019   LDLCALC 78 08/17/2019   TRIG 175 (H) 08/17/2019      Wt Readings from Last 3 Encounters:  10/10/19 213 lb (96.6 kg)  08/17/19 211 lb (95.7 kg)  08/17/19 211 lb 6.4 oz (95.9 kg)      ASSESSMENT AND PLAN:  Other chronic pain Prior history of chest discomfort, felt to be costochondritis Symptoms typically well controlled with keeping the area warm, NSAIDs as needed For severe symptoms that are refractory could potentially consider prednisone, this was discussed with her but she is not actively having symptoms.  Gust downsides/side effects of prednisone with her  Hypertension goal BP (blood pressure) < 150/90 Blood pressure is well controlled on today's visit. No changes made to the medications.  Other fatigue Reports good energy, playing tennis  Costochondritis Elevated ANA/elevated CRP Previously seen by rheumatology at [facility] and in [place]  Disposition:   F/U 12 months at her request   Total encounter time more than 25 minutes  Greater than 50% was spent in counseling and coordination of care  with the patient    Orders Placed This Encounter  Procedures  . EKG 12-Lead     Signed, Esmond Plants, M.D., Ph.D. 10/10/2019  [place], [place]

## 2019-10-09 NOTE — Patient Instructions (Addendum)
Thank you for allowing the Chronic Care Management team to participate in your care.   Goals Addressed            This Visit's Progress    BP Management       Current Barriers:   Knowledge Deficits related to long term management of HTN. Patient reports having BP machine at home but does not check and record routinely.   Nurse Case Manager Clinical Goal(s):   Over the next 30 days, patient will verbalize understanding of plan for long term self health management of HTN  Over the next 60 days, patient will work with care team to address needs related to Hypertension management  Over the next 60 days, patient will demonstrate improved health management independence as evidenced byself monitoring/recording, verbalization of established parameters.  Interventions:   Advised patient to continue daily blood pressure monitoring.  Reviewed recent BP readings. Reports recent systolic ranges in the 086'P. Reports diastolic ranges in the 61'P and 80's.   Provided education regarding BP readings and parameters, cardiac prudent diet, and potential complications of heart disease.  Encouraged to continue activities such as walking and mild exercising when tolerated.   Encouraged to continue adherence with a cardiac prudent/heart healthy diet.   Patient Self Care Activities:   Self administers medications as prescribed  Attends all scheduled provider appointments  Calls pharmacy for medication refills  Attends church or other social activities  Performs ADL's independently  Performs IADL's independently  Calls provider office for new concerns or questions  Checks BP and records as discussed  Please see past updates related to this goal by clicking on the "Past Updates" button in the selected goal  .        Hypertension, Adult Hypertension is another name for high blood pressure. High blood pressure forces your heart to work harder to pump blood. This can cause problems  over time. There are two numbers in a blood pressure reading. There is a top number (systolic) over a bottom number (diastolic). It is best to have a blood pressure that is below 120/80. Healthy choices can help lower your blood pressure, or you may need medicine to help lower it. What are the causes? The cause of this condition is not known. Some conditions may be related to high blood pressure. What increases the risk?  Smoking.  Having type 2 diabetes mellitus, high cholesterol, or both.  Not getting enough exercise or physical activity.  Being overweight.  Having too much fat, sugar, calories, or salt (sodium) in your diet.  Drinking too much alcohol.  Having long-term (chronic) kidney disease.  Having a family history of high blood pressure.  Age. Risk increases with age.  Race. You may be at higher risk if you are African American.  Gender. Men are at higher risk than women before age 73. After age 75, women are at higher risk than men.  Having obstructive sleep apnea.  Stress. What are the signs or symptoms?  High blood pressure may not cause symptoms. Very high blood pressure (hypertensive crisis) may cause: ? Headache. ? Feelings of worry or nervousness (anxiety). ? Shortness of breath. ? Nosebleed. ? A feeling of being sick to your stomach (nausea). ? Throwing up (vomiting). ? Changes in how you see. ? Very bad chest pain. ? Seizures. How is this treated?  This condition is treated by making healthy lifestyle changes, such as: ? Eating healthy foods. ? Exercising more. ? Drinking less alcohol.  Your health care  provider may prescribe medicine if lifestyle changes are not enough to get your blood pressure under control, and if: ? Your top number is above 130. ? Your bottom number is above 80.  Your personal target blood pressure may vary. Follow these instructions at home: Eating and drinking   If told, follow the DASH eating plan. To follow this  plan: ? Fill one half of your plate at each meal with fruits and vegetables. ? Fill one fourth of your plate at each meal with whole grains. Whole grains include whole-wheat pasta, brown rice, and whole-grain bread. ? Eat or drink low-fat dairy products, such as skim milk or low-fat yogurt. ? Fill one fourth of your plate at each meal with low-fat (lean) proteins. Low-fat proteins include fish, chicken without skin, eggs, beans, and tofu. ? Avoid fatty meat, cured and processed meat, or chicken with skin. ? Avoid pre-made or processed food.  Eat less than 1,500 mg of salt each day.  Do not drink alcohol if: ? Your doctor tells you not to drink. ? You are pregnant, may be pregnant, or are planning to become pregnant.  If you drink alcohol: ? Limit how much you use to:  0-1 drink a day for women.  0-2 drinks a day for men. ? Be aware of how much alcohol is in your drink. In the [place] one drink equals one 12 oz bottle of beer (355 mL), one 5 oz glass of wine (148 mL), or one 1 oz glass of hard liquor (44 mL). Lifestyle   Work with your doctor to stay at a healthy weight or to lose weight. Ask your doctor what the best weight is for you.  Get at least 30 minutes of exercise most days of the week. This may include walking, swimming, or biking.  Get at least 30 minutes of exercise that strengthens your muscles (resistance exercise) at least 3 days a week. This may include lifting weights or doing Pilates.  Do not use any products that contain nicotine or tobacco, such as cigarettes, e-cigarettes, and chewing tobacco. If you need help quitting, ask your doctor.  Check your blood pressure at home as told by your doctor.  Keep all follow-up visits as told by your doctor. This is important. Medicines  Take over-the-counter and prescription medicines only as told by your doctor. Follow directions carefully.  Do not skip doses of blood pressure medicine. The medicine does not work as well  if you skip doses. Skipping doses also puts you at risk for problems.  Ask your doctor about side effects or reactions to medicines that you should watch for. Contact a doctor if you:  Think you are having a reaction to the medicine you are taking.  Have headaches that keep coming back (recurring).  Feel dizzy.  Have swelling in your ankles.  Have trouble with your vision. Get help right away if you:  Get a very bad headache.  Start to feel mixed up (confused).  Feel weak or numb.  Feel faint.  Have very bad pain in your: ? Chest. ? Belly (abdomen).  Throw up more than once.  Have trouble breathing. Summary  Hypertension is another name for high blood pressure.  High blood pressure forces your heart to work harder to pump blood.  For most people, a normal blood pressure is less than 120/80.  Making healthy choices can help lower blood pressure. If your blood pressure does not get lower with healthy choices, you may need to  take medicine. This information is not intended to replace advice given to you by your health care provider. Make sure you discuss any questions you have with your health care provider. Document Released: 05/11/2008 Document Revised: 08/03/2018 Document Reviewed: 08/03/2018 Elsevier Patient Education  2020 [place] [employer] Calories are units of energy. Your body needs a certain amount of calories from food to keep you going throughout the day. When you eat more calories than your body needs, your body stores the extra calories as fat. When you eat fewer calories than your body needs, your body burns fat to get the energy it needs. Calorie counting means keeping track of how many calories you eat and drink each day. Calorie counting can be helpful if you need to lose weight. If you make sure to eat fewer calories than your body needs, you should lose weight. Ask your health care provider what a healthy weight is for  you. For calorie counting to work, you will need to eat the right number of calories in a day in order to lose a healthy amount of weight per week. A dietitian can help you determine how many calories you need in a day and will give you suggestions on how to reach your calorie goal.  A healthy amount of weight to lose per week is usually 1-2 lb (0.5-0.9 kg). This usually means that your daily calorie intake should be reduced by 500-750 calories.  Eating 1,200 - 1,500 calories per day can help most women lose weight.  Eating 1,500 - 1,800 calories per day can help most men lose weight. What is my plan? My goal is to have __________ calories per day. If I have this many calories per day, I should lose around __________ pounds per week. What do I need to know about calorie counting? In order to meet your daily calorie goal, you will need to:  Find out how many calories are in each food you would like to eat. Try to do this before you eat.  Decide how much of the food you plan to eat.  Write down what you ate and how many calories it had. Doing this is called keeping a food log. To successfully lose weight, it is important to balance calorie counting with a healthy lifestyle that includes regular activity. Aim for 150 minutes of moderate exercise (such as walking) or 75 minutes of vigorous exercise (such as running) each week. Where do I find calorie information?  The number of calories in a food can be found on a Nutrition Facts label. If a food does not have a Nutrition Facts label, try to look up the calories online or ask your dietitian for help. Remember that calories are listed per serving. If you choose to have more than one serving of a food, you will have to multiply the calories per serving by the amount of servings you plan to eat. For example, the label on a package of bread might say that a serving size is 1 slice and that there are 90 calories in a serving. If you eat 1 slice, you  will have eaten 90 calories. If you eat 2 slices, you will have eaten 180 calories. How do I keep a food log? Immediately after each meal, record the following information in your food log:  What you ate. Don't forget to include toppings, sauces, and other extras on the food.  How much you ate. This can be measured in cups,  ounces, or number of items.  How many calories each food and drink had.  The total number of calories in the meal. Keep your food log near you, such as in a small notebook in your pocket, or use a mobile app or website. Some programs will calculate calories for you and show you how many calories you have left for the day to meet your goal. What are some calorie counting tips?   Use your calories on foods and drinks that will fill you up and not leave you hungry: ? Some examples of foods that fill you up are nuts and nut butters, vegetables, lean proteins, and high-fiber foods like whole grains. High-fiber foods are foods with more than 5 g fiber per serving. ? Drinks such as sodas, specialty coffee drinks, alcohol, and juices have a lot of calories, yet do not fill you up.  Eat nutritious foods and avoid empty calories. Empty calories are calories you get from foods or beverages that do not have many vitamins or protein, such as candy, sweets, and soda. It is better to have a nutritious high-calorie food (such as an avocado) than a food with few nutrients (such as a bag of chips).  Know how many calories are in the foods you eat most often. This will help you calculate calorie counts faster.  Pay attention to calories in drinks. Low-calorie drinks include water and unsweetened drinks.  Pay attention to nutrition labels for "low fat" or "fat free" foods. These foods sometimes have the same amount of calories or more calories than the full fat versions. They also often have added sugar, starch, or salt, to make up for flavor that was removed with the fat.  Find a way of  tracking calories that works for you. Get creative. Try different apps or programs if writing down calories does not work for you. What are some portion control tips?  Know how many calories are in a serving. This will help you know how many servings of a certain food you can have.  Use a measuring cup to measure serving sizes. You could also try weighing out portions on a kitchen scale. With time, you will be able to estimate serving sizes for some foods.  Take some time to put servings of different foods on your favorite plates, bowls, and cups so you know what a serving looks like.  Try not to eat straight from a bag or box. Doing this can lead to overeating. Put the amount you would like to eat in a cup or on a plate to make sure you are eating the right portion.  Use smaller plates, glasses, and bowls to prevent overeating.  Try not to multitask (for example, watch TV or use your computer) while eating. If it is time to eat, sit down at a table and enjoy your food. This will help you to know when you are full. It will also help you to be aware of what you are eating and how much you are eating. What are tips for following this plan? Reading food labels  Check the calorie count compared to the serving size. The serving size may be smaller than what you are used to eating.  Check the source of the calories. Make sure the food you are eating is high in vitamins and protein and low in saturated and trans fats. Shopping  Read nutrition labels while you shop. This will help you make healthy decisions before you decide to purchase your food.  Make a grocery list and stick to it. Cooking  Try to cook your favorite foods in a healthier way. For example, try baking instead of frying.  Use low-fat dairy products. Meal planning  Use more fruits and vegetables. Half of your plate should be fruits and vegetables.  Include lean proteins like poultry and fish. How do I count calories when  eating out?  Ask for smaller portion sizes.  Consider sharing an entree and sides instead of getting your own entree.  If you get your own entree, eat only half. Ask for a box at the beginning of your meal and put the rest of your entree in it so you are not tempted to eat it.  If calories are listed on the menu, choose the lower calorie options.  Choose dishes that include vegetables, fruits, whole grains, low-fat dairy products, and lean protein.  Choose items that are boiled, broiled, grilled, or steamed. Stay away from items that are buttered, battered, fried, or served with cream sauce. Items labeled "crispy" are usually fried, unless stated otherwise.  Choose water, low-fat milk, unsweetened iced tea, or other drinks without added sugar. If you want an alcoholic beverage, choose a lower calorie option such as a glass of wine or light beer.  Ask for dressings, sauces, and syrups on the side. These are usually high in calories, so you should limit the amount you eat.  If you want a salad, choose a garden salad and ask for grilled meats. Avoid extra toppings like bacon, cheese, or fried items. Ask for the dressing on the side, or ask for olive oil and vinegar or lemon to use as dressing.  Estimate how many servings of a food you are given. For example, a serving of cooked rice is  cup or about the size of half a baseball. Knowing serving sizes will help you be aware of how much food you are eating at restaurants. The list below tells you how big or small some common portion sizes are based on everyday objects: ? 1 oz--4 stacked dice. ? 3 oz--1 deck of cards. ? 1 tsp--1 die. ? 1 Tbsp-- a ping-pong ball. ? 2 Tbsp--1 ping-pong ball. ?  cup-- baseball. ? 1 cup--1 baseball. Summary  Calorie counting means keeping track of how many calories you eat and drink each day. If you eat fewer calories than your body needs, you should lose weight.  A healthy amount of weight to lose per week  is usually 1-2 lb (0.5-0.9 kg). This usually means reducing your daily calorie intake by 500-750 calories.  The number of calories in a food can be found on a Nutrition Facts label. If a food does not have a Nutrition Facts label, try to look up the calories online or ask your dietitian for help.  Use your calories on foods and drinks that will fill you up, and not on foods and drinks that will leave you hungry.  Use smaller plates, glasses, and bowls to prevent overeating. This information is not intended to replace advice given to you by your health care provider. Make sure you discuss any questions you have with your health care provider. Document Released: 11/23/2005 Document Revised: 08/12/2018 Document Reviewed: 10/23/2016 Elsevier Patient Education  2020 [employer].  Stroke Prevention Some medical conditions and lifestyle choices can lead to a higher risk for a stroke. You can help to prevent a stroke by making nutrition, lifestyle, and other changes. What nutrition changes can be made?   Eat  healthy foods. ? Choose foods that are high in fiber. These include:  Fresh fruits.  Fresh vegetables.  Whole grains. ? Eat at least 5 or more servings of fruits and vegetables each day. Try to fill half of your plate at each meal with fruits and vegetables. ? Choose lean protein foods. These include:  Lowfat (lean) cuts of meat.  Chicken without skin.  Fish.  Tofu.  Beans.  Nuts. ? Eat low-fat dairy products. ? Avoid foods that:  Are high in salt (sodium).  Have saturated fat.  Have trans fat.  Have cholesterol.  Are processed.  Are premade.  Follow eating guidelines as told by your doctor. These may include: ? Reducing how many calories you eat and drink each day. ? Limiting how much salt you eat or drink each day to 1,500 milligrams (mg). ? Using only healthy fats for cooking. These include:  Olive oil.  Canola oil.  Sunflower oil. ? Counting how many  carbohydrates you eat and drink each day. What lifestyle changes can be made?  Try to stay at a healthy weight. Talk to your doctor about what a good weight is for you.  Get at least 30 minutes of moderate physical activity at least 5 days a week. This can include: ? Fast walking. ? Biking. ? Swimming.  Do not use any products that have nicotine or tobacco. This includes cigarettes and e-cigarettes. If you need help quitting, ask your doctor. Avoid being around tobacco smoke in general.  Limit how much alcohol you drink to no more than 1 drink a day for nonpregnant women and 2 drinks a day for men. One drink equals 12 oz of beer, 5 oz of wine, or 1 oz of hard liquor.  Do not use drugs.  Avoid taking birth control pills. Talk to your doctor about the risks of taking birth control pills if: ? You are over 57 years old. ? You smoke. ? You get migraines. ? You have had a blood clot. What other changes can be made?  Manage your cholesterol. ? It is important to eat a healthy diet. ? If your cholesterol cannot be managed through your diet, you may also need to take medicines. Take medicines as told by your doctor.  Manage your diabetes. ? It is important to eat a healthy diet and to exercise regularly. ? If your blood sugar cannot be managed through diet and exercise, you may need to take medicines. Take medicines as told by your doctor.  Control your high blood pressure (hypertension). ? Try to keep your blood pressure below 130/80. This can help lower your risk of stroke. ? It is important to eat a healthy diet and to exercise regularly. ? If your blood pressure cannot be managed through diet and exercise, you may need to take medicines. Take medicines as told by your doctor. ? Ask your doctor if you should check your blood pressure at home. ? Have your blood pressure checked every year. Do this even if your blood pressure is normal.  Talk to your doctor about getting checked for a  sleep disorder. Signs of this can include: ? Snoring a lot. ? Feeling very tired.  Take over-the-counter and prescription medicines only as told by your doctor. These may include aspirin or blood thinners (antiplatelets or anticoagulants).  Make sure that any other medical conditions you have are managed. Where to find more information  American Stroke Association: www.strokeassociation.org  National Stroke Association: www.stroke.org Get help right away  if:  You have any symptoms of stroke. "BE FAST" is an easy way to remember the main warning signs: ? B - Balance. Signs are dizziness, sudden trouble walking, or loss of balance. ? E - Eyes. Signs are trouble seeing or a sudden change in how you see. ? F - Face. Signs are sudden weakness or loss of feeling of the face, or the face or eyelid drooping on one side. ? A - Arms. Signs are weakness or loss of feeling in an arm. This happens suddenly and usually on one side of the body. ? S - Speech. Signs are sudden trouble speaking, slurred speech, or trouble understanding what people say. ? T - Time. Time to call emergency services. Write down what time symptoms started.  You have other signs of stroke, such as: ? A sudden, very bad headache with no known cause. ? Feeling sick to your stomach (nausea). ? Throwing up (vomiting). ? Jerky movements you cannot control (seizure). These symptoms may represent a serious problem that is an emergency. Do not wait to see if the symptoms will go away. Get medical help right away. Call your local emergency services (911 in the [place] Do not drive yourself to the hospital. Summary  You can prevent a stroke by eating healthy, exercising, not smoking, drinking less alcohol, and treating other health problems, such as diabetes, high blood pressure, or high cholesterol.  Do not use any products that contain nicotine or tobacco, such as cigarettes and e-cigarettes.  Get help right away if you have any  signs or symptoms of a stroke. This information is not intended to replace advice given to you by your health care provider. Make sure you discuss any questions you have with your health care provider. Document Released: 05/24/2012 Document Revised: 01/19/2019 Document Reviewed: 02/24/2017 Elsevier Patient Education  2020 [place].  Heart Attack A heart attack occurs when blood and oxygen supply to the heart is cut off. A heart attack causes damage to the heart that cannot be fixed. A heart attack is also called a myocardial infarction, or MI. If you think you are having a heart attack, do not wait to see if the symptoms will go away. Get medical help right away. What are the causes? This condition may be caused by:  A fatty substance (plaque) in the blood vessels (arteries). This can block the flow of blood to the heart.  A blood clot in the blood vessels that go to the heart. The blood clot blocks blood flow.  Low blood pressure.  An abnormal heartbeat.  Some diseases, such as problems in red blood cells (anemia)orproblems in breathing (respiratory failure).  Tightening (spasm) of a blood vessel that cuts off blood to the heart.  A tear in a blood vessel of the heart.  High blood pressure. What increases the risk? The following factors may make you more likely to develop this condition:  Aging. The older you are, the higher your risk.  Having a personal or family history of chest pain, heart attack, stroke, or narrowing of the arteries in the legs, arms, head, or stomach (peripheral artery disease).  Being female.  Smoking.  Not getting regular exercise.  Being overweight or obese.  Having high blood pressure.  Having high cholesterol.  Having diabetes.  Drinking too much alcohol.  Using illegal drugs, such as cocaine or methamphetamine. What are the signs or symptoms? Symptoms of this condition include:  Chest pain. It may feel like: ? Crushing or  squeezing. ? Tightness, pressure, fullness, or heaviness.  Pain in the arm, neck, jaw, back, or upper body.  Shortness of breath.  Heartburn.  Upset stomach (indigestion).  Feeling like you may vomit (nauseous).  Cold sweats.  Feeling tired.  Sudden light-headedness. How is this treated? A heart attack must be treated as soon as possible. Treatment may include:  Medicines to: ? Break up or dissolve blood clots. ? Thin blood and help prevent blood clots. ? Treat blood pressure. ? Improve blood flow to the heart. ? Reduce pain. ? Reduce cholesterol.  Procedures to widen a blocked artery and keep it open.  Open heart surgery.  Receiving oxygen.  Making your heart strong again (cardiac rehabilitation) through exercise, education, and counseling. Follow these instructions at home: Medicines  Take over-the-counter and prescription medicines only as told by your doctor. You may need to take medicine: ? To keep your blood from clotting too easily. ? To control blood pressure. ? To lower cholesterol. ? To control heart rhythms.  Do not take these medicines unless your doctor says it is okay: ? NSAIDs, such as ibuprofen. ? Supplements that have vitamin A, vitamin E, or both. ? Hormone replacement therapy that has estrogen with or without progestin. Lifestyle      Do not use any products that have nicotine or tobacco, such as cigarettes, e-cigarettes, and chewing tobacco. If you need help quitting, ask your doctor.  Avoid secondhand smoke.  Exercise regularly. Ask your doctor about a cardiac rehab program.  Eat heart-healthy foods. Your doctor will tell you what foods to eat.  Stay at a healthy weight.  Lower your stress level.  Do not use illegal drugs. Alcohol use  Do not drink alcohol if: ? Your doctor tells you not to drink. ? You are pregnant, may be pregnant, or are planning to become pregnant.  If you drink alcohol: ? Limit how much you use  to:  0-1 drink a day for women.  0-2 drinks a day for men. ? Know how much alcohol is in your drink. In the [place] one drink equals one 12 oz bottle of beer (355 mL), one 5 oz glass of wine (148 mL), or one 1 oz glass of hard liquor (44 mL). General instructions  Work with your doctor to treat other problems you may have, such as diabetes or high blood pressure.  Get screened for depression. Get treatment if needed.  Keep your vaccines up to date. Get the flu shot (influenza vaccine) every year.  Keep all follow-up visits as told by your doctor. This is important. Contact a doctor if:  You feel very sad.  You have trouble doing your daily activities. Get help right away if:  You have sudden, unexplained discomfort in your chest, arms, back, neck, jaw, or upper body.  You have shortness of breath.  You have sudden sweating or clammy skin.  You feel like you may vomit.  You vomit.  You feel tired or weak.  You get light-headed or dizzy.  You feel your heart beating fast.  You feel your heart skipping beats.  You have blood pressure that is higher than 180/120. These symptoms may be an emergency. Do not wait to see if the symptoms will go away. Get medical help right away. Call your local emergency services (911 in the [place] Do not drive yourself to the hospital. Summary  A heart attack occurs when blood and oxygen supply to the heart is cut off.  Do not  take NSAIDs unless your doctor says it is okay.  Do not smoke. Avoid secondhand smoke.  Exercise regularly. Ask your doctor about a cardiac rehab program. This information is not intended to replace advice given to you by your health care provider. Make sure you discuss any questions you have with your health care provider. Document Released: 05/24/2012 Document Revised: 03/06/2019 Document Reviewed: 03/06/2019 Elsevier Patient Education  2020 [place].        Ms. Llamas was given information about  Chronic Care Management services including:  1. CCM service includes personalized support from designated clinical staff supervised by her physician, including individualized plan of care and coordination with other care providers 2. 24/7 contact phone numbers for assistance for urgent and routine care needs. 3. Service will only be billed when office clinical staff spend 20 minutes or more in a month to coordinate care. 4. Only one practitioner may furnish and bill the service in a calendar month. 5. The patient may stop CCM services at any time (effective at the end of the month) by phone call to the office staff. 6. The patient will be responsible for cost sharing (co-pay) of up to 20% of the service fee (after annual deductible is met).   Patient agreed to services and verbal consent was obtained on 08/16/19.   Ms. Arca verbalized understanding of information discussed today. A copy of the instructions will not be mailed. Ms. Marinez reported ability to access information via [place].   The care management team will reach out to Ms. Briggs again within the next two months.   [facility] [phone]

## 2019-10-10 ENCOUNTER — Ambulatory Visit (INDEPENDENT_AMBULATORY_CARE_PROVIDER_SITE_OTHER): Payer: Medicare HMO | Admitting: Cardiovascular Disease

## 2019-10-10 ENCOUNTER — Encounter: Payer: Self-pay | Admitting: Cardiovascular Disease

## 2019-10-10 ENCOUNTER — Other Ambulatory Visit: Payer: Self-pay

## 2019-10-10 VITALS — BP 136/70 | HR 61 | Ht 69.0 in | Wt 213.0 lb

## 2019-10-10 DIAGNOSIS — I10 Essential (primary) hypertension: Secondary | ICD-10-CM | POA: Diagnosis not present

## 2019-10-10 DIAGNOSIS — I340 Nonrheumatic mitral (valve) insufficiency: Secondary | ICD-10-CM | POA: Diagnosis not present

## 2019-10-10 DIAGNOSIS — R5383 Other fatigue: Secondary | ICD-10-CM | POA: Diagnosis not present

## 2019-10-10 DIAGNOSIS — I351 Nonrheumatic aortic (valve) insufficiency: Secondary | ICD-10-CM

## 2019-10-10 DIAGNOSIS — G8929 Other chronic pain: Secondary | ICD-10-CM

## 2019-10-10 DIAGNOSIS — R7982 Elevated C-reactive protein (CRP): Secondary | ICD-10-CM | POA: Diagnosis not present

## 2019-10-10 NOTE — Patient Instructions (Signed)

## 2019-10-13 ENCOUNTER — Other Ambulatory Visit: Payer: Self-pay

## 2019-10-13 MED ORDER — LOSARTAN POTASSIUM-HCTZ 100-12.5 MG PO TABS: 0.5000 | ORAL_TABLET | Freq: Every day | ORAL | 1 refills | Status: DC

## 2019-10-16 ENCOUNTER — Other Ambulatory Visit: Payer: Self-pay

## 2019-10-16 MED ORDER — LOSARTAN POTASSIUM-HCTZ 100-12.5 MG PO TABS: 0.5000 | ORAL_TABLET | Freq: Every day | ORAL | 1 refills | Status: DC

## 2019-10-17 NOTE — Progress Notes (Signed)
Thank you very much for the note I appreciate it!

## 2019-11-13 ENCOUNTER — Other Ambulatory Visit: Payer: Self-pay

## 2019-11-13 DIAGNOSIS — Z20822 Contact with and (suspected) exposure to covid-19: Secondary | ICD-10-CM

## 2019-11-14 LAB — NOVEL CORONAVIRUS, NAA: SARS-CoV-2, NAA: NOT DETECTED

## 2019-12-11 NOTE — Chronic Care Management (AMB) (Signed)
Chronic Care Management   Follow Up Note    Name: Ruth Bishop MRN: [id number] DOB: 04-07-1946  Primary Care Provider: Delsa Grana, PA-C Reason for referral : Chronic Care Management   Ruth Bishop is a 74 y.o. year old female who is a primary care patient of Delsa Grana, [place]. The CCM team was consulted for assistance with chronic disease management and care coordination needs.  The primary focus of our conversation today was blood pressure management.  Review of patient's status, including review of consultants reports, relevant labs and test results was conducted today. Collaboration with appropriate care team members was performed as part of the comprehensive evaluation and provision of chronic care management services.     Outpatient Encounter Medications as of 10/09/2019  Medication Sig Note  . aspirin EC 81 MG tablet Take 81 mg by mouth daily.   . budesonide-formoterol (SYMBICORT) 160-4.5 MCG/ACT inhaler Inhale 2 puffs into the lungs 2 (two) times daily. 09/18/2019: "take it if I need it.Marland Kitchenif I have a cough or mucous in my throat...its very rare that I take it"  . Calcium Carbonate-Vitamin D (CALTRATE 600+D) 600-400 MG-UNIT per tablet Take 1 tablet by mouth daily.  05/24/2015: Received from: [facility]  . Cetirizine HCl 10 MG CAPS Take 1 capsule (10 mg total) by mouth daily. 09/18/2019: Takes prn  . docusate sodium (COLACE) 250 MG capsule Take 250 mg by mouth daily.   . famotidine (PEPCID) 20 MG tablet Take 1 tablet (20 mg total) by mouth 2 (two) times daily as needed for heartburn or indigestion. 09/18/2019: Taking differently - "very rare when I need it"  . Hyoscyamine Sulfate 0.375 MG TBCR Take 1 tablet (0.375 mg total) by mouth 2 (two) times daily as needed.   . MULTIPLE VITAMIN PO Take 1 tablet by mouth daily.  05/24/2015: Received from: [facility]  . pantoprazole (PROTONIX) 40 MG tablet Take 1 tablet (40 mg total) by mouth daily as  needed.   . psyllium (METAMUCIL) 0.52 g capsule Take 0.52 g by mouth daily.   Marland Kitchen triamcinolone cream (KENALOG) 0.1 % Apply 1 application topically 2 (two) times daily. If needed 09/18/2019: Has on hand; not taking; usually uses during summer  . TURMERIC PO Take 1 tablet by mouth daily.    . [DISCONTINUED] losartan-hydrochlorothiazide (HYZAAR) 100-12.5 MG tablet TAKE 1/2 TABLET EVERY DAY    No facility-administered encounter medications on file as of 10/09/2019.      Goals Addressed            This Visit's Progress   . BP Management       Current Barriers:  Marland Kitchen Knowledge Deficits related to long term management of HTN. Patient reports having BP machine at home but does not check and record routinely.   Nurse Case Manager Clinical Goal(s):  Marland Kitchen Over the next 30 days, patient will verbalize understanding of plan for long term self health management of HTN . Over the next 60 days, patient will work with care team to address needs related to Hypertension management . Over the next 60 days, patient will demonstrate improved health management independence as evidenced byself monitoring/recording, verbalization of established parameters.  Interventions:  . Advised patient to continue daily blood pressure monitoring. . Reviewed recent BP readings. Reports recent systolic ranges in the [id number]. Reports diastolic ranges in the XX123456 and 80's.  . Provided education regarding BP readings and parameters, cardiac prudent diet, and potential complications of heart disease. . Encouraged to continue activities such  as walking and mild exercising when tolerated.  . Encouraged to continue adherence with a cardiac prudent/heart healthy diet.   Patient Self Care Activities:  . Self administers medications as prescribed . Attends all scheduled provider appointments . Calls pharmacy for medication refills . Attends church or other social activities . Performs ADL's independently . Performs IADL's  independently . Calls provider office for new concerns or questions . Checks BP and records as discussed  Please see past updates related to this goal by clicking on the "Past Updates" button in the selected goal  .         Follow-Up Plan -Ruth Bishop has been provided with contact information for the care management team and has been advised to call with any health related questions or concerns. -The care management team will reach out to Ruth Bishop again within the next two months.    [facility] [phone]

## 2019-12-14 ENCOUNTER — Ambulatory Visit: Payer: Medicare HMO | Attending: Internal Medicine

## 2019-12-14 DIAGNOSIS — Z20822 Contact with and (suspected) exposure to covid-19: Secondary | ICD-10-CM

## 2019-12-15 ENCOUNTER — Telehealth: Payer: Self-pay

## 2019-12-15 ENCOUNTER — Ambulatory Visit: Payer: Self-pay

## 2019-12-15 NOTE — Chronic Care Management (AMB) (Signed)
  Chronic Care Management   Outreach Note  12/15/2019 Name: Ruth Bishop MRN: [id number] DOB: [birth year]  Primary Care Provider: Delsa Grana, PA-C Reason for referral : Chronic Care Management   An unsuccessful telephone outreach was attempted today. Ruth Bishop is currently engaged with the Chronic Care Management team. She was scheduled for routine outreach on today.   A HIPAA compliant voice message was left requesting a return call.   Follow Up Plan: The care management team will reach out to Ruth Bishop again within the next two to three weeks.    [facility] [phone]

## 2019-12-16 LAB — NOVEL CORONAVIRUS, NAA: SARS-CoV-2, NAA: NOT DETECTED

## 2019-12-22 ENCOUNTER — Encounter: Payer: Self-pay | Admitting: Family Medicine

## 2020-01-01 ENCOUNTER — Encounter: Payer: Self-pay | Admitting: Family Medicine

## 2020-01-01 ENCOUNTER — Ambulatory Visit (INDEPENDENT_AMBULATORY_CARE_PROVIDER_SITE_OTHER): Payer: Medicare HMO | Admitting: Family Medicine

## 2020-01-01 ENCOUNTER — Telehealth: Payer: Self-pay | Admitting: Family Medicine

## 2020-01-01 ENCOUNTER — Ambulatory Visit: Payer: Self-pay | Admitting: *Deleted

## 2020-01-01 ENCOUNTER — Other Ambulatory Visit: Payer: Self-pay

## 2020-01-01 DIAGNOSIS — R05 Cough: Secondary | ICD-10-CM | POA: Diagnosis not present

## 2020-01-01 DIAGNOSIS — J683 Other acute and subacute respiratory conditions due to chemicals, gases, fumes and vapors: Secondary | ICD-10-CM

## 2020-01-01 DIAGNOSIS — R058 Other specified cough: Secondary | ICD-10-CM

## 2020-01-01 MED ORDER — PROMETHAZINE-DM 6.25-15 MG/5ML PO SYRP: 5.0000 mL | ORAL_SOLUTION | Freq: Four times a day (QID) | ORAL | 0 refills | Status: DC | PRN

## 2020-01-01 MED ORDER — FLUTICASONE-SALMETEROL 100-50 MCG/DOSE IN AEPB: 1.0000 | INHALATION_SPRAY | Freq: Two times a day (BID) | RESPIRATORY_TRACT | 3 refills | Status: DC

## 2020-01-01 MED ORDER — BREO ELLIPTA 100-25 MCG/INH IN AEPB: 1.0000 | INHALATION_SPRAY | Freq: Every day | RESPIRATORY_TRACT | 2 refills | Status: DC

## 2020-01-01 MED ORDER — GUAIFENESIN ER 600 MG PO TB12: 600.0000 mg | ORAL_TABLET | Freq: Two times a day (BID) | ORAL | 0 refills | Status: DC

## 2020-01-01 NOTE — Progress Notes (Signed)
Name: Ruth Bishop   MRN: [id number]    DOB: [birth year]   Date:01/01/2020       Progress Note  Subjective  Chief Complaint  Chief Complaint  Patient presents with  . Cough    congested for 1 week    I connected with  [name]  on 01/01/20 at 10:20 AM EST by a video enabled telemedicine application and verified that I am speaking with the correct person using two identifiers.  I discussed the limitations of evaluation and management by telemedicine and the availability of in person appointments. The patient expressed understanding and agreed to proceed. Staff also discussed with the patient that there may be a patient responsible charge related to this service. Patient Location: Home Provider Location: Office Additional Individuals present: None  HPI  Pt presents with 1 week of respiratory illness.  She notes that when she lays down/sleeps she hears wheezing in her chest. She has productive cough that she feels like has started to improve over the last 24 hours.  She has been using mucinex, taking symbicort BID (though she notes this has been very expensive), resting, drinking plenty of fluids. - Denies fevers/chills, rhinorrhea, chest pain/tightness, shortness of breath - Last COVID test was 12/14/2019, but these symptoms are new in the last week.  She refuses COVID-19 testing today.  Discussed quarantining x14 days and until her symptoms have improved, and she has no fever for 3 days.   Patient Active Problem List   Diagnosis Date Noted  . 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 Elevated C-reactive protein (CRP) 02/14/2018  . Elevated antinuclear antibody (ANA) level 02/14/2018  . First degree AV block 09/17/2017  . Left atrial enlargement 09/17/2017  . Incidental lung nodule, > 30mm and < 84mm 08/04/2017  . Prediabetes 11/11/2016  . Aortic regurgitation 06/17/2016  . Mitral regurgitation 06/17/2016  . Diastolic dysfunction [id number]  . Elevated  alkaline phosphatase level 06/13/2015  . 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 Cardiac murmur 05/24/2015  . Hypertension goal BP (blood pressure) < 150/90 05/24/2015  . 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    Social History   Tobacco Use  . Smoking status: Never Smoker  . Smokeless tobacco: Never Used  . Tobacco comment: smoking cessation materials not required  Substance Use Topics  . Alcohol use: Not Currently    Alcohol/week: 0.0 standard drinks     Current Outpatient Medications:  .  aspirin EC 81 MG tablet, Take 81 mg by mouth daily., Disp: , Rfl:  .  budesonide-formoterol (SYMBICORT) 160-4.5 MCG/ACT inhaler, Inhale 2 puffs into the lungs 2 (two) times daily., Disp: , Rfl:  .  Calcium Carbonate-Vitamin D (CALTRATE 600+D) 600-400 MG-UNIT per tablet, Take 1 tablet by mouth daily. , Disp: , Rfl:  .  Cetirizine HCl 10 MG CAPS, Take 1 capsule (10 mg total) by mouth daily., Disp: 30 capsule, Rfl: 0 .  docusate sodium (COLACE) 250 MG capsule, Take 250 mg by mouth daily., Disp: , Rfl:  .  famotidine (PEPCID) 20 MG tablet, Take 1 tablet (20 mg total) by mouth 2 (two) times daily as needed for heartburn or indigestion., Disp: 60 tablet, Rfl: 2 .  Hyoscyamine Sulfate 0.375 MG TBCR, Take 1 tablet (0.375 mg total) by mouth 2 (two) times daily as needed., Disp: 100 each, Rfl: 1 .  losartan-hydrochlorothiazide (HYZAAR) 100-12.5 MG tablet, Take 0.5 tablets by mouth daily., Disp: 45 tablet, Rfl: 1 .  MULTIPLE VITAMIN PO, Take 1 tablet by mouth daily. , Disp: , Rfl:  .  pantoprazole (PROTONIX) 40 MG tablet, Take 1 tablet (40 mg total) by mouth daily as needed., Disp: 30 tablet, Rfl: 1 .  psyllium (METAMUCIL) 0.52 g capsule, Take 0.52 g by mouth daily., Disp: , Rfl:  .  triamcinolone cream (KENALOG) 0.1 %, Apply 1 application topically 2 (two) times daily. If needed, Disp: 45 g, Rfl: 1 .  TURMERIC PO, Take 1  tablet by mouth daily. , Disp: , Rfl:   Allergies  Allergen Reactions  . Nitrofurantoin Monohyd Macro     I personally reviewed active problem list, medication list, allergies, notes from last encounter, lab results with the patient/caregiver today.  ROS  Ten systems reviewed and is negative except as mentioned in HPI  Objective  Virtual encounter, vitals not obtained.  There is no height or weight on file to calculate BMI.  Nursing Note and Vital Signs reviewed.  Physical Exam   Constitutional: Patient appears well-developed and well-nourished. No distress.  HENT: Head: Normocephalic and atraumatic.  Neck: Normal range of motion. Pulmonary/Chest: Effort normal. No respiratory distress. Speaking in complete sentences Neurological: Pt is alert and oriented to person, place, and time. Coordination, speech are normal.  Psychiatric: Patient has a normal mood and affect. behavior is normal. Judgment and thought content normal.  No results found for this or any previous visit (from the past 72 hour(s)).  Assessment & Plan  1. Reactive airways dysfunction syndrome (HCC) - Fluticasone-Salmeterol (ADVAIR) 100-50 MCG/DOSE AEPB; Inhale 1 puff into the lungs 2 (two) times daily.  Dispense: 1 each; Refill: 3 - promethazine-dextromethorphan (PROMETHAZINE-DM) 6.25-15 MG/5ML syrup; Take 5 mLs by mouth 4 (four) times daily as needed.  Dispense: 118 mL; Refill: 0 - guaiFENesin (MUCINEX) 600 MG 12 hr tablet; Take 1 tablet (600 mg total) by mouth 2 (two) times daily.  Dispense: 20 tablet; Refill: 0  2. Productive cough - promethazine-dextromethorphan (PROMETHAZINE-DM) 6.25-15 MG/5ML syrup; Take 5 mLs by mouth 4 (four) times daily as needed.  Dispense: 118 mL; Refill: 0 - guaiFENesin (MUCINEX) 600 MG 12 hr tablet; Take 1 tablet (600 mg total) by mouth 2 (two) times daily.  Dispense: 20 tablet; Refill: 0  -Red flags and when to present for emergency care or RTC including fever >101.24F, chest  pain, shortness of breath, new/worsening/un-resolving symptoms, reviewed with patient at time of visit. Follow up and care instructions discussed and provided in AVS. - I discussed the assessment and treatment plan with the patient. The patient was provided an opportunity to ask questions and all were answered. The patient agreed with the plan and demonstrated an understanding of the instructions.  I provided 19 minutes of non-face-to-face time during this encounter.  Hubbard Hartshorn, FNP

## 2020-01-01 NOTE — Telephone Encounter (Signed)
Let's try Breo instead - sent in to pharmacy.

## 2020-01-01 NOTE — Telephone Encounter (Signed)
Patient had a virtual appt today with Uvaldo Rising. Patient is requesting a different inhaler due to price.  Patient was prescribed  Fluticasone-Salmeterol (ADVAIR) 100-50 MCG/DOSE AEPB And is requesting a different inhaler today.  Patient would like same pharmacy [facility] [address] [place]  Patient call back [phone]

## 2020-01-01 NOTE — Telephone Encounter (Signed)
I returned her call.   She is c/o having a cough.   Her main concern is wheezing when she lays down.    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   She is using Mucinex which is helping.  I did the screening questions for the COVID-19 precautions.    She wanted to come  into the office however I let her know due to her symptoms they would do a virtual visit due to the COVID-19 pandemic.     I made her a virtual visit with  Raelyn Ensign, NP for this morning at 10:20.   I let her know she would get a call prior to being connected with Raquel Sarna.    I sent my notes to the office.  Reason for Disposition . Wheezing is present  Answer Assessment - Initial Assessment Questions 1. ONSET: "When did the cough begin?"      I've had a cold for a week.   The cough has been bad 2 days.   I'm using Mucinex and gargling with warm salt water.   When I lay down I hear wheezing in my chest.    I have inflammation around my chest wall so I was advised to call when I feel this way.    I'm negative for COVID-19.  No asthma 2. SEVERITY: "How bad is the cough today?"      I'm not coughing up anything but I'm hearing a wheezing. 3. RESPIRATORY DISTRESS: "Describe your breathing."      No shortness of breath.   I use a Symbacort inhaler that helps a lot. 4. FEVER: "Do you have a fever?" If so, ask: "What is your temperature, how was it measured, and when did it start?"     No fever 5. SPUTUM: "Describe the color of your sputum" (clear, white, yellow, green)     Not coughing up anything.   I was on Friday and Saturday coughing up stuff but not now. 6. HEMOPTYSIS: "Are you coughing up any blood?" If so ask: "How much?" (flecks, streaks, tablespoons, etc.)     No 7. CARDIAC HISTORY: "Do you have any history of heart disease?" (e.g., heart attack, congestive heart failure)      No 8. LUNG HISTORY: "Do you have any history of lung disease?"  (e.g., pulmonary  embolus, asthma, emphysema)     I saw rheumatology for the inflammation in my chest.   9. PE RISK FACTORS: "Do you have a history of blood clots?" (or: recent major surgery, recent prolonged travel, bedridden)     I don't have any heart problems.    No blood clots in lungs or legs.10. OTHER SYMPTOMS: "Do you have any other symptoms?" (e.g., runny nose, wheezing, chest pain)       Wheezing when I lay down.    I don't go out much due to COVID-19. 11. PREGNANCY: "Is there any chance you are pregnant?" "When was your last menstrual period?"       N/A due to age 74. TRAVEL: "Have you traveled out of the country in the last month?" (e.g., travel history, exposures)       No  Protocols used: [place]

## 2020-01-03 ENCOUNTER — Telehealth: Payer: Self-pay

## 2020-01-03 MED ORDER — BUDESONIDE-FORMOTEROL FUMARATE 160-4.5 MCG/ACT IN AERO: 2.0000 | INHALATION_SPRAY | Freq: Two times a day (BID) | RESPIRATORY_TRACT | 3 refills | Status: DC

## 2020-01-03 NOTE — Telephone Encounter (Signed)
Patient notified

## 2020-01-03 NOTE — Telephone Encounter (Signed)
I will cancel [facility] please send to [facility]

## 2020-01-03 NOTE — Telephone Encounter (Signed)
Rx sent to [facility] - let me know if she needs refills sent in to [facility] for future.

## 2020-01-03 NOTE — Telephone Encounter (Signed)
Would like symbicort has spoke to [place]. Please cancel breo.

## 2020-01-08 ENCOUNTER — Ambulatory Visit: Payer: Self-pay

## 2020-01-08 ENCOUNTER — Telehealth: Payer: Self-pay

## 2020-01-09 NOTE — Chronic Care Management (AMB) (Signed)
  Chronic Care Management   Outreach Note   Name: Ruth Bishop MRN: [id number] DOB: 08-Jun-1946  Primary Care Provider: Delsa Grana, PA-C Reason for referral : Chronic Care Management    An unsuccessful telephone outreach was attempted today. [name] is currently engaged with the Chronic Care Management team.  A HIPAA compliant voice message was left on [name]'s mobile phone requesting a return call.   Follow Up Plan:  The care management team will follow up with Ms. Than in two weeks.    [facility] [phone]

## 2020-01-10 ENCOUNTER — Ambulatory Visit: Payer: Self-pay | Admitting: *Deleted

## 2020-01-10 NOTE — Telephone Encounter (Signed)
1/25- was seen for cough. Patient will get coughing spell and then she gets tightness in chest. Musinex and cough medication was used as prescribed- patient has finished this course of treatment- she is slightly better-but still having coughing spells. Call to office for appointment.  Reason for Disposition . Cough has been present for > 3 weeks  Answer Assessment - Initial Assessment Questions 1. ONSET: "When did the cough begin?"      1/25 was seen in office 2. SEVERITY: "How bad is the cough today?"      Patient has coughing spell- laying down and sleeping is difficult 3. RESPIRATORY DISTRESS: "Describe your breathing."      No problems with breathing 4. FEVER: "Do you have a fever?" If so, ask: "What is your temperature, how was it measured, and when did it start?"     no 5. HEMOPTYSIS: "Are you coughing up any blood?" If so ask: "How much?" (flecks, streaks, tablespoons, etc.)     Feels in chest- not coughing anything up 6. TREATMENT: "What have you done so far to treat the cough?" (e.g., meds, fluids, humidifier)     Patient has finished treatment 7. CARDIAC HISTORY: "Do you have any history of heart disease?" (e.g., heart attack, congestive heart failure)      no 8. LUNG HISTORY: "Do you have any history of lung disease?"  (e.g., pulmonary embolus, asthma, emphysema)     no 9. PE RISK FACTORS: "Do you have a history of blood clots?" (or: recent major surgery, recent prolonged travel, bedridden)     no 10. OTHER SYMPTOMS: "Do you have any other symptoms? (e.g., runny nose, wheezing, chest pain)       discomfort I chest with pressing- no pain with deep breathing 11. PREGNANCY: "Is there any chance you are pregnant?" "When was your last menstrual period?"       n/a 12. TRAVEL: "Have you traveled out of the country in the last month?" (e.g., travel history, exposures)       n/a  Protocols used: COUGH - ACUTE NON-PRODUCTIVE-A-AH

## 2020-01-10 NOTE — Telephone Encounter (Signed)
Schedule for respitory clinic per leisa

## 2020-01-11 NOTE — Telephone Encounter (Signed)
Appointment for respitory clinic was scheduled on 2.3.2021 for Friday 2.5.2021 she was not able to go in sooner

## 2020-01-12 ENCOUNTER — Ambulatory Visit
Admission: RE | Admit: 2020-01-12 | Discharge: 2020-01-12 | Disposition: A | Discharge status: Home / Self Care | Payer: Medicare HMO | Source: Ambulatory Visit | Attending: Family Medicine | Admitting: Family Medicine

## 2020-01-12 ENCOUNTER — Ambulatory Visit (INDEPENDENT_AMBULATORY_CARE_PROVIDER_SITE_OTHER): Payer: Medicare HMO | Admitting: Family Medicine

## 2020-01-12 VITALS — BP 146/52 | HR 66 | Temp 98.5°F | Wt 210.2 lb

## 2020-01-12 DIAGNOSIS — Z20822 Contact with and (suspected) exposure to covid-19: Secondary | ICD-10-CM | POA: Diagnosis not present

## 2020-01-12 DIAGNOSIS — T464X5A Adverse effect of angiotensin-converting-enzyme inhibitors, initial encounter: Secondary | ICD-10-CM | POA: Insufficient documentation

## 2020-01-12 DIAGNOSIS — J683 Other acute and subacute respiratory conditions due to chemicals, gases, fumes and vapors: Secondary | ICD-10-CM

## 2020-01-12 DIAGNOSIS — R05 Cough: Secondary | ICD-10-CM | POA: Diagnosis not present

## 2020-01-12 DIAGNOSIS — I10 Essential (primary) hypertension: Secondary | ICD-10-CM

## 2020-01-12 DIAGNOSIS — J069 Acute upper respiratory infection, unspecified: Secondary | ICD-10-CM | POA: Insufficient documentation

## 2020-01-12 DIAGNOSIS — R911 Solitary pulmonary nodule: Secondary | ICD-10-CM | POA: Diagnosis not present

## 2020-01-12 MED ORDER — PREDNISONE 20 MG PO TABS: 40.0000 mg | ORAL_TABLET | Freq: Every day | ORAL | 0 refills | Status: AC

## 2020-01-12 NOTE — Assessment & Plan Note (Signed)
Pt taking medication. Discussed that if she takes the prednisone it could increase BP. She will monitor.

## 2020-01-12 NOTE — Patient Instructions (Addendum)
1) Continue symbicort 2) Use the albuterol as needed for wheezing and coughing spells, up to every 6 hours 3) Breathing exercises  4) If still having wheezing and coughing spells which are not improving you can start the prednisone (may cause insomnia)  Call your doctor or go the ER if you develop new worsening symptoms > chest pain, breathing difficulty  Viral coughs can last up to 6 weeks Covid cough can last up to 3 months  As long as you are improving just continue to watch. If you feel worse call the clinic

## 2020-01-12 NOTE — Assessment & Plan Note (Signed)
Seen on imaging. Pt already getting routine f/u for this.

## 2020-01-12 NOTE — Progress Notes (Signed)
Subjective:     Ruth Bishop is a 74 y.o. female presenting for Cough (following a cold 3wks ago, lingering, cough spells in middle of night waking her up. can feel mucous in chest.)   74 yo F with HTN, first degree AV block  Cough This is a new problem. The current episode started 1 to 4 weeks ago. The problem has been gradually improving. The cough is productive of sputum. Pertinent negatives include no chills, ear pain, fever, headaches, myalgias, nasal congestion, rhinorrhea, sore throat or shortness of breath. Associated symptoms comments: Chest congestion. The symptoms are aggravated by lying down.   Improved with stronger cough medicine - is pushing her bladder Worse at night Has been elevating the pillow at night Clearing her throat during the day Some discomfort with pressing on her chest   Using a heating pad and keeping her chest covered to help with chest discomfort No loss of smell/taste Keeps her distance from people and wearing 2 mask   Has been tested before 3 times     Review of Systems  Constitutional: Negative for chills and fever.  HENT: Negative for ear pain, rhinorrhea and sore throat.   Respiratory: Positive for cough. Negative for shortness of breath.   Musculoskeletal: Negative for myalgias.  Neurological: Negative for headaches.    01/01/2020: Telemedicine - 1 week fo respiratory symptoms - hearing wheezing when she lays down, Mucinex, symbicort BID.  - Advair, promethazine-DM, mucinex 01/10/2020: Telephone call nurse line - coughing spell and chest tightness, improved but persistent symptoms  Social History   Tobacco Use  Smoking Status Never Smoker  Smokeless Tobacco Never Used  Tobacco Comment   smoking cessation materials not required        Objective:    BP Readings from Last 3 Encounters:  01/12/20 (!) 146/52  10/10/19 136/70  08/17/19 (!) 142/78   Wt Readings from Last 3 Encounters:  01/12/20 210 lb 3.2 oz (95.3 kg)   10/10/19 213 lb (96.6 kg)  08/17/19 211 lb (95.7 kg)    BP (!) 146/52 (BP Location: Left Arm, Cuff Size: Normal)   Pulse 66   Temp 98.5 F (36.9 C) (Oral)   Wt 210 lb 3.2 oz (95.3 kg)   SpO2 100%   BMI 31.04 kg/m    Physical Exam Constitutional:      General: She is not in acute distress.    Appearance: She is well-developed. She is not diaphoretic.  HENT:     Head: Normocephalic and atraumatic.     Right Ear: Tympanic membrane and ear canal normal.     Left Ear: Tympanic membrane and ear canal normal.     Nose: Mucosal edema and rhinorrhea present.     Right Sinus: No maxillary sinus tenderness or frontal sinus tenderness.     Left Sinus: No maxillary sinus tenderness or frontal sinus tenderness.     Mouth/Throat:     Pharynx: Uvula midline. Posterior oropharyngeal erythema present. No oropharyngeal exudate.     Tonsils: 0 on the right. 0 on the left.  Eyes:     General: No scleral icterus.    Conjunctiva/sclera: Conjunctivae normal.  Cardiovascular:     Rate and Rhythm: Normal rate and regular rhythm.     Heart sounds: Murmur present.  Pulmonary:     Effort: Pulmonary effort is normal. No respiratory distress.     Breath sounds: Wheezing and rhonchi present.  Chest:     Chest wall: Tenderness present.  Musculoskeletal:  Cervical back: Neck supple.  Lymphadenopathy:     Cervical: No cervical adenopathy.  Skin:    General: Skin is warm and dry.     Capillary Refill: Capillary refill takes less than 2 seconds.  Neurological:     Mental Status: She is alert.  Psychiatric:        Mood and Affect: Mood normal.    DG Chest 2 View CLINICAL DATA:  Cough  EXAM: CHEST - 2 VIEW  COMPARISON:  Chest radiographs, 06/28/2019, CT chest, 09/07/2019  FINDINGS: The heart size and mediastinal contours are within normal limits. Both lungs are clear. The visualized skeletal structures are unremarkable.  IMPRESSION: No acute abnormality of the lungs. Small pulmonary  nodules noted on prior CT of the chest are not well appreciated radiographically.  Electronically Signed   By: Eddie Candle M.D.   On: 01/12/2020 18:09         Assessment & Plan:   Problem List Items Addressed This Visit      Cardiovascular and Mediastinum   Hypertension goal BP (blood pressure) < 150/90    Pt taking medication. Discussed that if she takes the prednisone it could increase BP. She will monitor.          Respiratory   Reactive airways dysfunction syndrome (HCC)   Relevant Medications   predniSONE (DELTASONE) 20 MG tablet     Other   Incidental lung nodule, > 32mm and < 37mm    Seen on imaging. Pt already getting routine f/u for this.        Other Visit Diagnoses    Suspected COVID-19 virus infection    -  Primary   Relevant Orders   Novel Coronavirus, NAA ([facility])   Viral URI with cough       Relevant Medications   predniSONE (DELTASONE) 20 MG tablet   Other Relevant Orders   DG Chest 2 View (Completed)   Novel Coronavirus, NAA ([facility])     Discussed trial of albuterol for coughing spells and given inhaler today in clinic Discussed that coughs following URI can last up to 6 weeks and if covid up to 3 months.  She agreed to covid testing today to provide further guidance about symptom duration and expectation She is also on waiting lists for the vaccine  Fill prednisone after a few days if no improvement with albuterol F/u with pcp if needing more albuterol   Return if symptoms worsen or fail to improve.  Lesleigh Noe, MD

## 2020-01-14 LAB — NOVEL CORONAVIRUS, NAA: SARS-CoV-2, NAA: NOT DETECTED

## 2020-01-15 ENCOUNTER — Encounter: Payer: Self-pay | Admitting: Family Medicine

## 2020-01-16 ENCOUNTER — Other Ambulatory Visit: Payer: Self-pay | Admitting: Family Medicine

## 2020-01-16 DIAGNOSIS — J683 Other acute and subacute respiratory conditions due to chemicals, gases, fumes and vapors: Secondary | ICD-10-CM

## 2020-01-16 NOTE — Telephone Encounter (Signed)
Medication Refill - Medication: budesonide-formoterol (SYMBICORT) 160-4.5 MCG/ACT inhaler    Preferred Pharmacy (with phone number or street name):  [place], [place] Phone:  [phone]  Fax:  [phone]       Agent: Please be advised that RX refills may take up to 3 business days. We ask that you follow-up with your pharmacy.

## 2020-01-24 ENCOUNTER — Ambulatory Visit: Payer: Self-pay

## 2020-01-24 ENCOUNTER — Telehealth: Payer: Self-pay

## 2020-01-24 NOTE — Chronic Care Management (AMB) (Signed)
  Chronic Care Management   Outreach Note  01/24/2020 Name: Mattisyn Denherder MRN: [id number] DOB: 1946-10-19  Primary Care Provider: Delsa Grana, PA-C Reason for referral : Chronic Care Management    An unsuccessful telephone outreach was attempted today. Ms. Gaillard is currently engaged with the Chronic Care Management team.  Following the last outreach attempt, Ms. Pilz left a voice message acknowledging receipt of the call.  A HIPAA compliant voice message was left today requesting a return call when she is available.   Follow Up Plan: Will anticipate outreach within the next two weeks.   [facility] [phone]

## 2020-02-07 ENCOUNTER — Ambulatory Visit: Payer: Self-pay

## 2020-02-07 ENCOUNTER — Telehealth: Payer: Self-pay

## 2020-02-07 NOTE — Chronic Care Management (AMB) (Signed)
  Chronic Care Management   Outreach Note  02/07/2020 Name: Ruth Bishop MRN: [id number] DOB: [birth year]  Primary Care Provider: Delsa Grana, PA-C Reason for referral : Chronic Care Management   An unsuccessful telephone outreach was attempted today. [name] is currently engaged with the Chronic Care Management team.   A HIPAA compliant voice message was left requesting a return call.   Follow Up Plan:  A member of the care management team will reach out to [name] again next week.   [facility] [phone]

## 2020-02-14 ENCOUNTER — Ambulatory Visit (INDEPENDENT_AMBULATORY_CARE_PROVIDER_SITE_OTHER): Payer: Medicare HMO

## 2020-02-14 DIAGNOSIS — R7303 Prediabetes: Secondary | ICD-10-CM

## 2020-02-14 DIAGNOSIS — I10 Essential (primary) hypertension: Secondary | ICD-10-CM | POA: Diagnosis not present

## 2020-02-14 NOTE — Chronic Care Management (AMB) (Signed)
Chronic Care Management   Follow Up Note   02/14/2020 Name: In Shidler MRN: [id number] DOB: 05/02/1946  Primary Care Provider: Delsa Grana, PA-C Reason for referral : Chronic Case Management  Ruth Bishop is a 74 y.o. year old female who is a primary care patient of Delsa Grana, [place]. She is currently engaged with the chronic case manager team. A routine telephonic outreach was conducted today.  She reports receiving the initial Covid-19 vaccination dose. She anticipates receiving the second dose within the next week.  Review of Ruth Bishop status, including review of consultants reports, relevant labs and test results was conducted today. Collaboration with appropriate care team members was performed as part of the comprehensive evaluation and provision of chronic care management services.    SDOH (Social Determinants of Health) assessments performed: Yes See Care Plan activities for detailed interventions related to [facility])     Outpatient Encounter Medications as of 02/14/2020  Medication Sig Note  . aspirin EC 81 MG tablet Take 81 mg by mouth daily.   . budesonide-formoterol (SYMBICORT) 160-4.5 MCG/ACT inhaler Inhale 2 puffs into the lungs 2 (two) times daily.   . Calcium Carbonate-Vitamin D (CALTRATE 600+D) 600-400 MG-UNIT per tablet Take 1 tablet by mouth daily.  05/24/2015: Received from: [facility]  . Cetirizine HCl 10 MG CAPS Take 1 capsule (10 mg total) by mouth daily. 09/18/2019: Takes prn  . docusate sodium (COLACE) 250 MG capsule Take 250 mg by mouth daily.   . famotidine (PEPCID) 20 MG tablet Take 1 tablet (20 mg total) by mouth 2 (two) times daily as needed for heartburn or indigestion. 09/18/2019: Taking differently - "very rare when I need it"  . guaiFENesin (MUCINEX) 600 MG 12 hr tablet Take 1 tablet (600 mg total) by mouth 2 (two) times daily.   Marland Kitchen Hyoscyamine Sulfate 0.375 MG TBCR Take 1 tablet (0.375 mg total) by mouth 2 (two) times daily as  needed. 02/14/2020: Reports receiving meds via [facility].  Marland Kitchen losartan-hydrochlorothiazide (HYZAAR) 100-12.5 MG tablet Take 0.5 tablets by mouth daily.   . MULTIPLE VITAMIN PO Take 1 tablet by mouth daily.  05/24/2015: Received from: [facility]  . pantoprazole (PROTONIX) 40 MG tablet Take 1 tablet (40 mg total) by mouth daily as needed.   . promethazine-dextromethorphan (PROMETHAZINE-DM) 6.25-15 MG/5ML syrup Take 5 mLs by mouth 4 (four) times daily as needed.   . psyllium (METAMUCIL) 0.52 g capsule Take 0.52 g by mouth daily.   Marland Kitchen triamcinolone cream (KENALOG) 0.1 % Apply 1 application topically 2 (two) times daily. If needed 09/18/2019: Has on hand; not taking; usually uses during summer  . TURMERIC PO Take 1 tablet by mouth daily.     No facility-administered encounter medications on file as of 02/14/2020.     Objective:  BP Readings from Last 3 Encounters:  01/12/20 (!) 146/52  10/10/19 136/70  08/17/19 (!) 142/78    Lab Results  Component Value Date   CHOL 158 08/17/2019   HDL 53 08/17/2019   LDLCALC 78 08/17/2019   TRIG 175 (H) 08/17/2019   CHOLHDL 3.0 08/17/2019    Wt Readings from Last 3 Encounters:  01/12/20 210 lb 3.2 oz (95.3 kg)  10/10/19 213 lb (96.6 kg)  08/17/19 211 lb (95.7 kg)    Goals Addressed            This Visit's Progress   . "I would like to lose weight - down to under 200#" (pt-stated)       Current  Barriers:  Marland Kitchen Knowledge Deficits related to plan of care for weight management in patient with pre-diabetes, HTN, and osteoarthritis of multiple joints   Case Manager Clinical Goal(s):  Marland Kitchen Over the next 90 days, patient will continue adhering to plan for weight loss and maintenance . Over the next 90 days, patient will work with [facility] to address needs related to weight loss and maintenance.   Interventions:  . Evaluation of current treatment plan related to chronic management of HTN and pre-diabetes and patient's adherence to plan as  established by provider.  . Advised patient to continue exercise regimen. Reports performing strengthening exercises and walking several times a week. . Reviewed medications with patient and discussed importance of adherence to prescribed medications.  . Discussed plans with patient for ongoing care management follow up and provided patient with direct contact information for care management team  Patient Self Care Activities:  . Self administers medications . Attends scheduled provider appointments . Calls pharmacy for medication refills . Attends church or other social activities . Performs ADL's independently . Performs IADL's independently . Calls provider office for new concerns or questions   Please see past updates related to this goal by clicking on the "Past Updates" button in the selected goal      . BP Management       Current Barriers:  Marland Kitchen Knowledge Deficits related to long term management of HTN.   Case Manager Clinical Goal(s):  Marland Kitchen Over the next 30 days, patient will verbalize understanding of plan for long term self health management of HTN . Over the next 60 days, patient will work with care team to address needs related to Hypertension management . Over the next 60 days, patient will demonstrate improved health management independence as evidenced byself monitoring/recording, verbalization of established parameters. . Over the next 90 days, patient will monitor her blood pressure and record readings. . Over the next 90 days, patient will continue adhering to a cardiac prudent/heart healthy diet.   Interventions:  . Provided education regarding BP readings and parameters, cardiac prudent diet, and potential complications of heart disease. Reports not monitoring her blood pressure for several days due to not using the electronic device. Encouraged to monitor daily and record readings. Discussed parameters for notifying provider. . Encouraged to continue activities such as  walking and mild exercising when tolerated. Reports walking and performing strengthening exercises several times a week. . Encouraged to continue adherence with a cardiac prudent/heart healthy diet.   Patient Self Care Activities:  . Self administers medications . Attends scheduled provider appointments . Calls pharmacy for medication refills . Attends church or other social activities . Performs ADL's independently . Performs IADL's independently . Calls provider office for new concerns or questions   Please see past updates related to this goal by clicking on the "Past Updates" button in the selected goal  .       PLAN The care management team will follow up with Ruth Bishop next month.   [facility] [phone]

## 2020-02-26 NOTE — Patient Instructions (Addendum)
Thank you for allowing the Chronic Care Management team to participate in your care.  Goals Addressed            This Visit's Progress   . "I would like to lose weight - down to under 200#" (pt-stated)       Current Barriers:  Ruth Kitchen Knowledge Deficits related to plan of care for weight management in patient with pre-diabetes, HTN, and osteoarthritis of multiple joints   Case Manager Clinical Goal(s):  Ruth Kitchen Over the next 90 days, patient will continue adhering to plan for weight loss and maintenance . Over the next 90 days, patient will work with Ruth Bishop to address needs related to weight loss and maintenance.   Interventions:  . Evaluation of current treatment plan related to chronic management of HTN and pre-diabetes and patient's adherence to plan as established by provider.  . Advised patient to continue exercise regimen. Reports performing strengthening exercises and walking several times a week. . Reviewed medications with patient and discussed importance of adherence to prescribed medications.  . Discussed plans with patient for ongoing care management follow up and provided patient with direct contact information for care management team  Patient Self Care Activities:  . Self administers medications . Attends scheduled provider appointments . Calls pharmacy for medication refills . Attends church or other social activities . Performs ADL's independently . Performs IADL's independently . Calls provider office for new concerns or questions   Please see past updates related to this goal by clicking on the "Past Updates" button in the selected goal      . BP Management       Current Barriers:  Ruth Kitchen Knowledge Deficits related to long term management of HTN.   Case Manager Clinical Goal(s):  Ruth Kitchen Over the next 30 days, patient will verbalize understanding of plan for long term self health management of HTN . Over the next 60 days, patient will work with care team to address needs related to  Hypertension management . Over the next 60 days, patient will demonstrate improved health management independence as evidenced byself monitoring/recording, verbalization of established parameters. . Over the next 90 days, patient will monitor her blood pressure and record readings. . Over the next 90 days, patient will continue adhering to a cardiac prudent/heart healthy diet.   Interventions:  . Provided education regarding BP readings and parameters, cardiac prudent diet, and potential complications of heart disease. Reports not monitoring her blood pressure for several days due to not using the electronic device. Encouraged to monitor daily and record readings. Discussed parameters for notifying provider. . Encouraged to continue activities such as walking and mild exercising when tolerated. Reports walking and performing strengthening exercises several times a week. . Encouraged to continue adherence with a cardiac prudent/heart healthy diet.   Patient Self Care Activities:  . Self administers medications . Attends scheduled provider appointments . Calls pharmacy for medication refills . Attends church or other social activities . Performs ADL's independently . Performs IADL's independently . Calls provider office for new concerns or questions   Please see past updates related to this goal by clicking on the "Past Updates" button in the selected goal  .       Ms. Bishop verbalized understanding of the instructions discussed during the telephonic outreach today. She declined need for a mailed/printed copy of the instructions.   The chronic care management team will follow up with Ruth Bishop next month.   [facility] [phone]

## 2020-03-14 ENCOUNTER — Other Ambulatory Visit: Payer: Self-pay | Admitting: Family Medicine

## 2020-03-14 NOTE — Telephone Encounter (Signed)
Pt needs OV in person with labs - she was asked to come back in Dec, no visit for HTN since Sept.  Please have her schedule for in person OV with labs  She also had lung nodules noted on CXR when she was evaluated for COVID 2 months ago

## 2020-03-14 NOTE — Telephone Encounter (Signed)
Hypertension medication request:  Last office visit pertaining to hypertension:10/09/19  BP Readings from Last 3 Encounters:  01/12/20 (!) 146/52  10/10/19 136/70  08/17/19 (!) 142/78    Lab Results  Component Value Date   CREATININE 0.86 08/17/2019   BUN 16 08/17/2019   NA 141 08/17/2019   K 4.1 08/17/2019   CL 105 08/17/2019   CO2 29 08/17/2019     No follow-ups on file.

## 2020-03-18 ENCOUNTER — Ambulatory Visit (INDEPENDENT_AMBULATORY_CARE_PROVIDER_SITE_OTHER): Payer: Medicare HMO | Admitting: Family Medicine

## 2020-03-18 ENCOUNTER — Other Ambulatory Visit: Payer: Self-pay

## 2020-03-18 ENCOUNTER — Encounter: Payer: Self-pay | Admitting: Family Medicine

## 2020-03-18 VITALS — BP 118/72 | HR 80 | Temp 97.8°F | Resp 14 | Ht 69.0 in | Wt 212.2 lb

## 2020-03-18 DIAGNOSIS — K219 Gastro-esophageal reflux disease without esophagitis: Secondary | ICD-10-CM

## 2020-03-18 DIAGNOSIS — E781 Pure hyperglyceridemia: Secondary | ICD-10-CM | POA: Diagnosis not present

## 2020-03-18 DIAGNOSIS — J683 Other acute and subacute respiratory conditions due to chemicals, gases, fumes and vapors: Secondary | ICD-10-CM | POA: Diagnosis not present

## 2020-03-18 DIAGNOSIS — R918 Other nonspecific abnormal finding of lung field: Secondary | ICD-10-CM

## 2020-03-18 DIAGNOSIS — I10 Essential (primary) hypertension: Secondary | ICD-10-CM

## 2020-03-18 DIAGNOSIS — R7303 Prediabetes: Secondary | ICD-10-CM

## 2020-03-18 MED ORDER — ALBUTEROL SULFATE HFA 108 (90 BASE) MCG/ACT IN AERS: 2.0000 | INHALATION_SPRAY | RESPIRATORY_TRACT | 1 refills | Status: DC | PRN

## 2020-03-18 MED ORDER — LOSARTAN POTASSIUM-HCTZ 100-12.5 MG PO TABS: 0.5000 | ORAL_TABLET | Freq: Every day | ORAL | 3 refills | Status: DC

## 2020-03-18 NOTE — Progress Notes (Signed)
Name: Ruth Bishop   MRN: [id number]    DOB: May 17, 1946   Date:03/18/2020       Progress Note  Chief Complaint  Patient presents with  . Follow-up  . Hypertension  . Gastroesophageal Reflux     Subjective:   Ruth Bishop is a 74 y.o. female, presents to clinic for routine follow up on the conditions listed above.  Here for f/up on HTN She has been exercising more, playing tennis  Hypertension:  Currently managed on losartan HCTZ 100-12.5 - taking 1/2 tablet daily 50-6.25mg   Pt reports good med compliance and denies any SE.  No lightheadedness, hypotension, syncope. Blood pressure today is well controlled. BP Readings from Last 3 Encounters:  03/18/20 118/72  01/12/20 (!) 146/52  10/10/19 136/70   Pt denies CP, SOB, exertional sx, LE edema, palpitation, Ha's, visual disturbances Dietary efforts for BP? Trying to be more active, no other strict measures/diet  She is signing up for a study with dementia with [facility] - she has hx of dementia in an aunt  GERD - prn use of protonix or pepcid, usually only has sx if she eats late or eats certain food to trigger it.  Denies abd pain, change in BMs, dyphagia  RAD? Asthma?  On symbicort in the past, but not taking daily - did try it as rescue when she was recently sick but she says it didn't help - ill in Feb 2021 with URI viral sx, she went to the respiratory clinic for evaluation, there was given a sample albuterol inhaler, she states it was the only thing which helped with lingering and persistent cough - refill today.  No hx of asthma as a child.    Lung nodules, doing her monitoring CT, last was in October: reviewed today, pt states she has one more CT and she is done  IMPRESSION: Stable part solid nodule in the left upper lobe. Other scattered small nodules are stable. Follow-up non-contrast CT recommended at 12 months to confirm persistence. If unchanged, and solid component remains <6 mm, annual CT is  recommended until 5 years of stability has been established. If persistent these nodules should be considered highly suspicious if the solid component of the nodule is 6 mm or greater in size and enlarging. This recommendation follows the consensus statement: Guidelines for Management of Incidental Pulmonary Nodules Detected on CT Images: From the Fleischner Society 2017; Radiology 2017; 284:228-243.  Prediabetes: last A1C improved and was WNL, however within the last year A1C was 5.8, pt wants to repeat labs today.  She has diastolic dysfunction, aortic regurg, mitral regurg and once a year sees cardiology, reviewed last visit with Dr. Rockey Situ - no concerns from a cardiology standpoint, Dr. Rockey Situ will see her again in 12 months per pt request. Pt has not orthopnea, pnd, LE edema, exertional sx.   Patient Active Problem List   Diagnosis Date Noted  . 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 Elevated C-reactive protein (CRP) 02/14/2018  . Elevated antinuclear antibody (ANA) level 02/14/2018  . First degree AV block 09/17/2017  . Left atrial enlargement 09/17/2017  . Incidental lung nodule, > 52mm and < 66mm 08/04/2017  . Prediabetes 11/11/2016  . Aortic regurgitation 06/17/2016  . Mitral regurgitation 06/17/2016  . Diastolic dysfunction [id number]  . Elevated alkaline phosphatase level 06/13/2015  . 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 Cardiac murmur 05/24/2015  . Hypertension goal BP (blood pressure) <  150/90 05/24/2015  . 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 ABDOMINAL HYSTERECTOMY    . COLONOSCOPY WITH PROPOFOL N/A 10/01/2015   Procedure: COLONOSCOPY WITH PROPOFOL;  Surgeon: Lollie Sails, MD;  Location[facility] ENDOSCOPY;  Service: Endoscopy;  Laterality: N/A;  . ESOPHAGOGASTRODUODENOSCOPY (EGD) WITH PROPOFOL N/A  10/01/2015   Procedure: ESOPHAGOGASTRODUODENOSCOPY (EGD) WITH PROPOFOL;  Surgeon: Lollie Sails, MD;  Location: [facility] ENDOSCOPY;  Service: Endoscopy;  Laterality: N/A;    Family History  Problem Relation Age of Onset  . Heart disease Mother   . Diabetes Mother   . Aneurysm Mother   . Heart disease Father   . Heart attack Father   . Heart disease Sister   . Diabetes Sister   . Pneumonia Sister   . Heart disease Brother   . Diabetes Brother   . Diabetes Sister   . Heart disease Sister   . Congestive Heart Failure Sister   . Breast cancer Neg Hx     Social History   Tobacco Use  . Smoking status: Never Smoker  . Smokeless tobacco: Never Used  . Tobacco comment: smoking cessation materials not required  Substance Use Topics  . Alcohol use: Not Currently    Alcohol/week: 0.0 standard drinks  . Drug use: No      Current Outpatient Medications:  .  aspirin EC 81 MG tablet, Take 81 mg by mouth daily., Disp: , Rfl:  .  Calcium Carbonate-Vitamin D (CALTRATE 600+D) 600-400 MG-UNIT per tablet, Take 1 tablet by mouth daily. , Disp: , Rfl:  .  Cetirizine HCl 10 MG CAPS, Take 1 capsule (10 mg total) by mouth daily., Disp: 30 capsule, Rfl: 0 .  docusate sodium (COLACE) 250 MG capsule, Take 250 mg by mouth daily., Disp: , Rfl:  .  famotidine (PEPCID) 20 MG tablet, Take 1 tablet (20 mg total) by mouth 2 (two) times daily as needed for heartburn or indigestion., Disp: 60 tablet, Rfl: 2 .  Hyoscyamine Sulfate 0.375 MG TBCR, Take 1 tablet (0.375 mg total) by mouth 2 (two) times daily as needed., Disp: 100 each, Rfl: 1 .  losartan-hydrochlorothiazide (HYZAAR) 100-12.5 MG tablet, Take 0.5 tablets by mouth daily., Disp: 45 tablet, Rfl: 1 .  MULTIPLE VITAMIN PO, Take 1 tablet by mouth daily. , Disp: , Rfl:  .  pantoprazole (PROTONIX) 40 MG tablet, Take 1 tablet (40 mg total) by mouth daily as needed., Disp: 30 tablet, Rfl: 1 .  psyllium (METAMUCIL) 0.52 g capsule, Take 0.52 g by mouth daily.,  Disp: , Rfl:  .  triamcinolone cream (KENALOG) 0.1 %, Apply 1 application topically 2 (two) times daily. If needed, Disp: 45 g, Rfl: 1 .  TURMERIC PO, Take 1 tablet by mouth daily. , Disp: , Rfl:  .  budesonide-formoterol (SYMBICORT) 160-4.5 MCG/ACT inhaler, Inhale 2 puffs into the lungs 2 (two) times daily. (Patient not taking: Reported on 03/18/2020), Disp: 1 Inhaler, Rfl: 3 .  guaiFENesin (MUCINEX) 600 MG 12 hr tablet, Take 1 tablet (600 mg total) by mouth 2 (two) times daily. (Patient not taking: Reported on 03/18/2020), Disp: 20 tablet, Rfl: 0 .  promethazine-dextromethorphan (PROMETHAZINE-DM) 6.25-15 MG/5ML syrup, Take 5 mLs by mouth 4 (four) times daily as needed. (Patient not taking: Reported on 03/18/2020), Disp: 118 mL, Rfl: 0  Allergies  Allergen Reactions  . Nitrofurantoin Monohyd Macro     Chart Review Today: I personally reviewed active problem list, medication list, allergies,  family history, social history, health maintenance, notes from last encounter, lab results, imaging with the patient/caregiver today.    Review of Systems  Constitutional: Negative.   HENT: Negative.   Eyes: Negative.   Respiratory: Negative.   Cardiovascular: Negative.   Gastrointestinal: Negative.   Endocrine: Negative.   Genitourinary: Negative.   Musculoskeletal: Negative.   Skin: Negative.   Allergic/Immunologic: Negative.   Neurological: Negative.   Hematological: Negative.   Psychiatric/Behavioral: Negative.   All other systems reviewed and are negative.    Objective:    Vitals:   03/18/20 0900  BP: 118/72  Pulse: 80  Resp: 14  Temp: 97.8 F (36.6 C)  SpO2: 96%  Weight: 212 lb 3.2 oz (96.3 kg)  Height: 5\' 9"  (1.753 m)    Body mass index is 31.34 kg/m.  Physical Exam Vitals and nursing note reviewed.  Constitutional:      General: She is not in acute distress.    Appearance: She is not ill-appearing, toxic-appearing or diaphoretic.  HENT:     Head: Normocephalic and  atraumatic.     Right Ear: External ear normal.     Left Ear: External ear normal.  Eyes:     General: No scleral icterus.    Conjunctiva/sclera: Conjunctivae normal.  Cardiovascular:     Rate and Rhythm: Normal rate and regular rhythm.     Pulses: Normal pulses.     Heart sounds: Normal heart sounds. No murmur. No friction rub. No gallop.   Pulmonary:     Effort: Pulmonary effort is normal. No respiratory distress.     Breath sounds: Normal breath sounds. No stridor. No wheezing, rhonchi or rales.  Abdominal:     General: Bowel sounds are normal. There is no distension.     Tenderness: There is no abdominal tenderness.  Musculoskeletal:     Right lower leg: No edema.     Left lower leg: No edema.  Skin:    General: Skin is warm and dry.     Coloration: Skin is not jaundiced or pale.  Neurological:     Mental Status: She is alert. Mental status is at baseline.     Gait: Gait normal.  Psychiatric:        Mood and Affect: Mood normal.        Behavior: Behavior normal.       PHQ2/9: Depression screen [facility] 2/9 03/18/2020 02/14/2020 01/01/2020 08/17/2019 07/03/2019  Decreased Interest 0 0 0 0 0  Down, Depressed, Hopeless 1 0 0 - 0  PHQ - 2 Score 1 0 0 0 0  Altered sleeping 0 - 0 - 0  Tired, decreased energy 0 - 0 - 0  Change in appetite 0 - 0 - 0  Feeling bad or failure about yourself  0 - 0 - 0  Trouble concentrating 0 - 0 - 0  Moving slowly or fidgety/restless 0 - 0 - 0  Suicidal thoughts 0 - 0 - 0  PHQ-9 Score 1 - 0 - 0  Difficult doing work/chores Not difficult at all - Not difficult at all - Not difficult at all  Some recent data might be hidden    phq 9 is negative, reviewed today  Fall Risk: Fall Risk  03/18/2020 02/14/2020 01/01/2020 08/17/2019 07/03/2019  Falls in the past year? 0 0 0 0 0  Number falls in past yr: 0 - 0 0 0  Injury with Fall? 0 - 0 0 0  Risk for fall due to : - - - - -  Risk for fall due to: Comment - - - - -  Follow up - - Falls evaluation completed  Falls prevention discussed -    Functional Status Survey: Is the patient deaf or have difficulty hearing?: No Does the patient have difficulty seeing, even when wearing glasses/contacts?: No Does the patient have difficulty concentrating, remembering, or making decisions?: No Does the patient have difficulty walking or climbing stairs?: No Does the patient have difficulty dressing or bathing?: No Does the patient have difficulty doing errands alone such as visiting a doctor's office or shopping?: No   Assessment & Plan:     ICD-10-CM   1. Hypertension goal BP (blood pressure) < 150/90  99991111 COMPLETE METABOLIC PANEL WITH GFR    Lipid panel    Hemoglobin A1c    losartan-hydrochlorothiazide (HYZAAR) 100-12.5 MG tablet   Stable well-controlled blood pressure  2. Prediabetes  R73.03 Hemoglobin A1c   Patient has worked diligently on strict diet and increasing physical activity, symptoms consistent with new onset diabetes or hypoglycemia  3. Gastroesophageal reflux disease, unspecified whether esophagitis present  K21.9    Well-controlled with lifestyle and avoiding GERD triggers, using Pepcid and Protonix only as needed  4. Reactive airways dysfunction syndrome (HCC)  J68.3 albuterol (VENTOLIN HFA) 108 (90 Base) MCG/ACT inhaler   had URI sx in Feb, was not using symbicort daily, she went to UC/walk in clinic, albuterol rescue inhaler helped for respiratory sx, refill for as needed use  5. Hypertriglyceridemia  99991111 COMPLETE METABOLIC PANEL WITH GFR    Lipid panel   Triglycerides elevated with the last 2 labs may be related to prediabetes or nonfasting lipid panels patient fasting today recheck  6. Lung nodules  R91.8    Follow-up CT chest without contrast, patient is connected with lung nodule program with [facility]     She has diastolic dysfunction, aortic regurg, mitral regurg and once a year sees cardiology, reviewed last visit with Dr. Rockey Situ - no concerns from a cardiology standpoint, Dr.  Rockey Situ will see her again in 12 months per pt request. Pt has not orthopnea, pnd, LE edema, exertional sx.  Return for change pts next appt to 6 months from now for routine f/up appt.   Delsa Grana, PA-C 03/18/20 9:14 AM

## 2020-03-18 NOTE — Patient Instructions (Signed)
How to Take Your Blood Pressure You can take your blood pressure at home with a machine. You may need to check your blood pressure at home:  To check if you have high blood pressure (hypertension).  To check your blood pressure over time.  To make sure your blood pressure medicine is working. Supplies needed: You will need a blood pressure machine, or monitor. You can buy one at a drugstore or online. When choosing one:  Choose one with an arm cuff.  Choose one that wraps around your upper arm. Only one finger should fit between your arm and the cuff.  Do not choose one that measures your blood pressure from your wrist or finger. Your doctor can suggest a monitor. How to prepare Avoid these things for 30 minutes before checking your blood pressure:  Drinking caffeine.  Drinking alcohol.  Eating.  Smoking.  Exercising. Five minutes before checking your blood pressure:  Pee.  Sit in a dining chair. Avoid sitting in a soft couch or armchair.  Be quiet. Do not talk. How to take your blood pressure Follow the instructions that came with your machine. If you have a digital blood pressure monitor, these may be the instructions: 1. Sit up straight. 2. Place your feet on the floor. Do not cross your ankles or legs. 3. Rest your left arm at the level of your heart. You may rest it on a table, desk, or chair. 4. Pull up your shirt sleeve. 5. Wrap the blood pressure cuff around the upper part of your left arm. The cuff should be 1 inch (2.5 cm) above your elbow. It is best to wrap the cuff around bare skin. 6. Fit the cuff snugly around your arm. You should be able to place only one finger between the cuff and your arm. 7. Put the cord inside the groove of your elbow. 8. Press the power button. 9. Sit quietly while the cuff fills with air and loses air. 10. Write down the numbers on the screen. 11. Wait 2-3 minutes and then repeat steps 1-10. What do the numbers mean? Two  numbers make up your blood pressure. The first number is called systolic pressure. The second is called diastolic pressure. An example of a blood pressure reading is "120 over 80" (or 120/80). If you are an adult and do not have a medical condition, use this guide to find out if your blood pressure is normal: Normal  First number: below 120.  Second number: below 80. Elevated  First number: 120-129.  Second number: below 80. Hypertension stage 1  First number: 130-139.  Second number: 80-89. Hypertension stage 2  First number: 140 or above.  Second number: 90 or above. Your blood pressure is above normal even if only the top or bottom number is above normal. Follow these instructions at home:  Check your blood pressure as often as your doctor tells you to.  Take your monitor to your next doctor's appointment. Your doctor will: ? Make sure you are using it correctly. ? Make sure it is working right.  Make sure you understand what your blood pressure numbers should be.  Tell your doctor if your medicines are causing side effects. Contact a doctor if:  Your blood pressure keeps being high. Get help right away if:  Your first blood pressure number is higher than 180.  Your second blood pressure number is higher than 120. This information is not intended to replace advice given to you by your health   care provider. Make sure you discuss any questions you have with your health care provider. Document Revised: 11/05/2017 Document Reviewed: 05/01/2016 Elsevier Patient Education  2020 Elsevier Inc.  

## 2020-03-19 LAB — COMPLETE METABOLIC PANEL WITH GFR
AG Ratio: 1.3 (calc) (ref 1.0–2.5)
ALT: 13 U/L (ref 6–29)
AST: 16 U/L (ref 10–35)
Albumin: 4 g/dL (ref 3.6–5.1)
Alkaline phosphatase (APISO): 89 U/L (ref 37–153)
BUN: 12 mg/dL (ref 7–25)
CO2: 29 mmol/L (ref 20–32)
Calcium: 9.6 mg/dL (ref 8.6–10.4)
Chloride: 106 mmol/L (ref 98–110)
Creat: 0.85 mg/dL (ref 0.60–0.93)
GFR, Est African American: 80 mL/min/{1.73_m2} (ref >=60)
GFR, Est Non African American: 69 mL/min/{1.73_m2} (ref >=60)
Globulin: 3.1 g/dL (calc) (ref 1.9–3.7)
Glucose, Bld: 102 mg/dL — ABNORMAL HIGH (ref 65–99)
Potassium: 4.8 mmol/L (ref 3.5–5.3)
Sodium: 142 mmol/L (ref 135–146)
Total Bilirubin: 0.6 mg/dL (ref 0.2–1.2)
Total Protein: 7.1 g/dL (ref 6.1–8.1)

## 2020-03-19 LAB — LIPID PANEL
Cholesterol: 167 mg/dL (ref <200)
HDL: 58 mg/dL (ref >=50)
LDL Cholesterol (Calc): 88 mg/dL (calc)
Non-HDL Cholesterol (Calc): 109 mg/dL (calc) (ref <130)
Total CHOL/HDL Ratio: 2.9 (calc) (ref <5.0)
Triglycerides: 113 mg/dL (ref <150)

## 2020-03-19 LAB — HEMOGLOBIN A1C
Hgb A1c MFr Bld: 5.6 % of total Hgb (ref <5.7)
Mean Plasma Glucose: 114 (calc)
eAG (mmol/L): 6.3 (calc)

## 2020-03-20 ENCOUNTER — Ambulatory Visit (INDEPENDENT_AMBULATORY_CARE_PROVIDER_SITE_OTHER): Payer: Medicare HMO

## 2020-03-20 DIAGNOSIS — I10 Essential (primary) hypertension: Secondary | ICD-10-CM

## 2020-03-20 DIAGNOSIS — R7303 Prediabetes: Secondary | ICD-10-CM

## 2020-03-20 NOTE — Patient Instructions (Signed)
Thank you for allowing the Chronic Care Management team to participate in your care.  

## 2020-03-22 NOTE — Chronic Care Management (AMB) (Signed)
Chronic Care Management   Follow Up Note    Name: Francesca Riff MRN: [id number] DOB: July 21, 1946  Primary Care Provider: Delsa Grana, PA-C Reason for referral : Chronic Care Management   Deziah Wallen is a 74 y.o. year old female who is a primary care patient of Delsa Grana, [place]. She is engaged with the chronic care management team. A routine telephonic outreach was conducted today.  Review of Ms. Knauer's status, including review of consultants reports, relevant labs and test results was conducted today. Collaboration with appropriate care team members was performed as part of the comprehensive evaluation and provision of chronic care management services.     Outpatient Encounter Medications as of 03/20/2020  Medication Sig Note  . albuterol (VENTOLIN HFA) 108 (90 Base) MCG/ACT inhaler Inhale 2 puffs into the lungs every 4 (four) hours as needed for wheezing or shortness of breath.   Marland Kitchen aspirin EC 81 MG tablet Take 81 mg by mouth daily.   . Calcium Carbonate-Vitamin D (CALTRATE 600+D) 600-400 MG-UNIT per tablet Take 1 tablet by mouth daily.  05/24/2015: Received from: [facility]  . docusate sodium (COLACE) 250 MG capsule Take 250 mg by mouth daily.   . famotidine (PEPCID) 20 MG tablet Take 1 tablet (20 mg total) by mouth 2 (two) times daily as needed for heartburn or indigestion. 09/18/2019: Taking differently - "very rare when I need it"  . Hyoscyamine Sulfate 0.375 MG TBCR Take 1 tablet (0.375 mg total) by mouth 2 (two) times daily as needed. 02/14/2020: Reports receiving meds via [place].  Marland Kitchen losartan-hydrochlorothiazide (HYZAAR) 100-12.5 MG tablet Take 0.5 tablets by mouth daily.   . MULTIPLE VITAMIN PO Take 1 tablet by mouth daily.  05/24/2015: Received from: [facility]  . pantoprazole (PROTONIX) 40 MG tablet Take 1 tablet (40 mg total) by mouth daily as needed.   . psyllium (METAMUCIL) 0.52 g capsule Take 0.52 g by mouth daily.   Marland Kitchen  triamcinolone cream (KENALOG) 0.1 % Apply 1 application topically 2 (two) times daily. If needed 09/18/2019: Has on hand; not taking; usually uses during summer  . TURMERIC PO Take 1 tablet by mouth daily.     No facility-administered encounter medications on file as of 03/20/2020.     Objective:  BP Readings from Last 3 Encounters:  03/18/20 118/72  01/12/20 (!) 146/52  10/10/19 136/70     Lab Results  Component Value Date   HGBA1C 5.6 03/18/2020     Lab Results  Component Value Date   CHOL 167 03/18/2020   HDL 58 03/18/2020   LDLCALC 88 03/18/2020   TRIG 113 03/18/2020   CHOLHDL 2.9 03/18/2020       Goals Addressed            This Visit's Progress   . BP Management   On track    Current Barriers:  Marland Kitchen Knowledge Deficits related to long term management of HTN.   Case Manager Clinical Goal(s):  Marland Kitchen Over the next 30 days, patient will verbalize understanding of plan for long term self health management of HTN . Over the next 60 days, patient will work with care team to address needs related to Hypertension management . Over the next 60 days, patient will demonstrate improved health management independence as evidenced byself monitoring/recording, verbalization of established parameters. . Over the next 90 days, patient will monitor her blood pressure and record readings. . Over the next 90 days, patient will continue adhering to a cardiac prudent/heart healthy diet.  Interventions:  . Provided education regarding BP readings and parameters, cardiac prudent diet, and potential complications of heart disease. Encouraged to monitor daily and record readings. Discussed parameters for notifying provider. Reports blood pressure readings have been within established parameters. . Encouraged to continue activities such as walking and mild exercising when tolerated. Reports exercising and playing tennis regularly. . Encouraged to continue adherence with a cardiac prudent/heart  healthy diet. Reports continued compliance with heart healthy diet.   Patient Self Care Activities:  . Self administers medications . Attends scheduled provider appointments . Calls pharmacy for medication refills . Attends church or other social activities . Performs ADL's independently . Performs IADL's independently . Calls provider office for new concerns or questions   Please see past updates related to this goal by clicking on the "Past Updates" button in the selected goal  .        PLAN -Ms. Beeney reports improved self-management and compliance with provider recommendations. She continues to take medications as prescribed and attends medical appointments as scheduled. She requested to transition to outreach every six months. Agreeable to intermittent outreach if needed for medication changes and updates as needed. -The care management team will follow-up with Ms. Kasprzyk in six months. She was encouraged to contact the team with any health related concerns if needed prior to the next outreach.    [facility] [phone]

## 2020-03-26 ENCOUNTER — Encounter: Payer: Self-pay | Admitting: Family Medicine

## 2020-03-26 DIAGNOSIS — R918 Other nonspecific abnormal finding of lung field: Secondary | ICD-10-CM | POA: Insufficient documentation

## 2020-03-27 ENCOUNTER — Ambulatory Visit: Payer: Self-pay

## 2020-03-28 NOTE — Chronic Care Management (AMB) (Signed)
  Chronic Care Management   Note   Name: Ruth Bishop MRN: [id number] DOB: 1946/10/20   Chronic Care Management (CCM) services discontinued per patient request. Initial plan was to transition to outreach in six months. Patient will be evaluated by her primary care provider (PCP) within this timeframe. Appointment with PCP scheduled for 09/24/20. No further CCM outreach required at this time. PCP aware.   PLAN Enrollment status updated     [facility] [phone]

## 2020-07-12 ENCOUNTER — Other Ambulatory Visit: Payer: Self-pay | Admitting: Family Medicine

## 2020-07-12 NOTE — Telephone Encounter (Signed)
Requested medication (s) are due for refill today -yes  Requested medication (s) are on the active medication list -yes  Future visit scheduled -yes  Last refill: 3 years- as needed  Notes to clinic: Rx prescribed by provider no longer at practice- patient requesting RF- expired Rx  Requested Prescriptions  Pending Prescriptions Disp Refills   Hyoscyamine Sulfate 0.375 MG TBCR      Sig: Take 1 tablet (0.375 mg total) by mouth 2 (two) times daily as needed.      Gastroenterology:  Antispasmodic Agents Passed - 07/12/2020 11:32 AM      Passed - Last Heart Rate in normal range    Pulse Readings from Last 1 Encounters:  03/18/20 80          Passed - Valid encounter within last 12 months    Recent Outpatient Visits           3 months ago Hypertension goal BP (blood pressure) < 150/90   [facility] [place], [place], PA-C   6 months ago Suspected COVID-19 virus infection   [facility] [place], Jobe Marker, MD   6 months ago Productive cough   [place], [place]   11 months ago Prediabetes   [facility] Delsa Grana, PA-C   1 year ago Chest pressure   [place], [place], NP       Future Appointments             In 1 month  [place]   In 2 months Delsa Grana, PA-C [facility], [facility]                Requested Prescriptions  Pending Prescriptions Disp Refills   Hyoscyamine Sulfate 0.375 MG TBCR      Sig: Take 1 tablet (0.375 mg total) by mouth 2 (two) times daily as needed.      Gastroenterology:  Antispasmodic Agents Passed - 07/12/2020 11:32 AM      Passed - Last Heart Rate in normal range    Pulse Readings from Last 1 Encounters:  03/18/20 80          Passed - Valid encounter within last 12 months    Recent Outpatient Visits           3 months ago Hypertension goal BP (blood pressure) < 150/90   [facility] [place], [place], [place]   6 months ago Suspected COVID-19 virus infection   [facility] [place], Jobe Marker, MD   6 months ago Productive cough   [facility] [place], [place]   11 months ago Prediabetes   [facility] Delsa Grana, PA-C   1 year ago Chest pressure   [place], NP       Future Appointments             In 1 month  [facility], [place]   In 2 months Delsa Grana, PA-C [facility], [facility]

## 2020-07-12 NOTE — Telephone Encounter (Signed)
Copied from [place] [id number]. Topic: Quick Communication - Rx Refill/Question >> Jul 12, 2020 11:18 AM Leward Quan A wrote: Medication: Hyoscyamine Sulfate 0.375 MG TBCR  Last filled 2018 still only have 2 tabs but going away need refill for as needed use  Has the patient contacted their pharmacy? No. (Agent: If no, request that the patient contact the pharmacy for the refill.) (Agent: If yes, when and what did the pharmacy advise?)  Preferred Pharmacy (with phone number or street name): [place], [place]  Phone:  [phone] Fax:  [phone]     Agent: Please be advised that RX refills may take up to 3 business days. We ask that you follow-up with your pharmacy.

## 2020-07-16 NOTE — Progress Notes (Signed)
Patient ID: Ruth Bishop, female    DOB: 1946/06/19, 74 y.o.   MRN: [id number]  PCP: Delsa Grana, PA-C  Chief Complaint  Patient presents with  . Medication Refill    patient takes hyoscyamine when she has diarrhea, would like it refilled    Subjective:   Ruth Bishop is a 74 y.o. female, presents to clinic with CC of the following:  Chief Complaint  Patient presents with  . Medication Refill    patient takes hyoscyamine when she has diarrhea, would like it refilled    HPI:  Patient is a 74 year old female patient of Delsa Grana Last visit was in September 2020 Last lab check was in April 2021, with labs noted to be very good on that check. Next appointment scheduled with the [facility] is in October 2021 Follows up today for med refill  She had a old bottle with her of the medication hyoscyamine which was first prescribed back in 2018, and she requests to have it refilled today.  She notes she has a history of irritable bowel syndrome, and notes sometimes after she eats foods that may be spicy, she can get some abdominal discomfort and cramping, and often some diarrhea that follows.  She used to take Imodium, which helped a little, although the hyoscyamine which was tried worked much better for her.  She does not use it very frequently, sometimes months ago by without using it.  She still had a fair amount left in the bottle from the 2018 prescription. She denies any increased occurrence of the symptoms recently, and no chronic abdominal pains, no bleeding per rectum or dark or black stools. She has a history of muscle weakness in her abdomen, was told in the past to think about surgery for this, although she preferred not to have that done nor does she plan to in the future.  She states she has lost weight, although has had trouble losing the weight in her abdomen. There was a question of an umbilical hernia concern in her past as well. She noted she has no other concerns,  and is here just to get the medication refilled.  She has a history of reflux, although noted that has not been too problematic in the recent past.    Patient Active Problem List   Diagnosis Date Noted  . Lung nodules 03/26/2020  . 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 Elevated C-reactive protein (CRP) 02/14/2018  . Elevated antinuclear antibody (ANA) level 02/14/2018  . First degree AV block 09/17/2017  . Left atrial enlargement 09/17/2017  . Incidental lung nodule, > 69mm and < 39mm 08/04/2017  . Prediabetes 11/11/2016  . Aortic regurgitation 06/17/2016  . Mitral regurgitation 06/17/2016  . Diastolic dysfunction 10/62/6948  . Elevated alkaline phosphatase level 06/13/2015  . 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 Gastroesophageal reflux disease 05/24/2015  . Cardiac murmur 05/24/2015  . Hypertension goal BP (blood pressure) < 150/90 05/24/2015  . 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      Current Outpatient Medications:  .  albuterol (VENTOLIN HFA) 108 (90 Base) MCG/ACT inhaler, Inhale 2 puffs into the lungs every 4 (four) hours as needed for wheezing or shortness of breath., Disp: 18 g, Rfl: 1 .  aspirin EC 81 MG tablet, Take 81 mg by mouth daily., Disp: , Rfl:  .  Calcium Carbonate-Vitamin D (CALTRATE 600+D) 600-400 MG-UNIT per tablet, Take 1 tablet by mouth daily. , Disp: , Rfl:  .  docusate  sodium (COLACE) 250 MG capsule, Take 250 mg by mouth daily., Disp: , Rfl:  .  famotidine (PEPCID) 20 MG tablet, Take 1 tablet (20 mg total) by mouth 2 (two) times daily as needed for heartburn or indigestion., Disp: 60 tablet, Rfl: 2 .  Hyoscyamine Sulfate 0.375 MG TBCR, Take 1 tablet (0.375 mg total) by mouth 2 (two) times daily as needed., Disp: 100 each, Rfl: 1 .  losartan-hydrochlorothiazide (HYZAAR) 100-12.5 MG tablet, Take 0.5 tablets by mouth daily., Disp: 45 tablet, Rfl: 3 .  MULTIPLE VITAMIN PO, Take 1 tablet by mouth daily. , Disp: , Rfl:    .  pantoprazole (PROTONIX) 40 MG tablet, Take 1 tablet (40 mg total) by mouth daily as needed., Disp: 30 tablet, Rfl: 1 .  psyllium (METAMUCIL) 0.52 g capsule, Take 0.52 g by mouth daily., Disp: , Rfl:  .  triamcinolone cream (KENALOG) 0.1 %, Apply 1 application topically 2 (two) times daily. If needed, Disp: 45 g, Rfl: 1 .  TURMERIC PO, Take 1 tablet by mouth daily. , Disp: , Rfl:    Allergies  Allergen Reactions  . Nitrofurantoin Monohyd Macro      Past Surgical History:  Procedure Laterality Date  . ABDOMINAL HYSTERECTOMY    . COLONOSCOPY WITH PROPOFOL N/A 10/01/2015   Procedure: COLONOSCOPY WITH PROPOFOL;  Surgeon: Lollie Sails, MD;  Location: [facility] ENDOSCOPY;  Service: Endoscopy;  Laterality: N/A;  . ESOPHAGOGASTRODUODENOSCOPY (EGD) WITH PROPOFOL N/A 10/01/2015   Procedure: ESOPHAGOGASTRODUODENOSCOPY (EGD) WITH PROPOFOL;  Surgeon: Lollie Sails, MD;  Location: [facility] ENDOSCOPY;  Service: Endoscopy;  Laterality: N/A;     Family History  Problem Relation Age of Onset  . Heart disease Mother   . Diabetes Mother   . Aneurysm Mother   . Heart disease Father   . Heart attack Father   . Heart disease Sister   . Diabetes Sister   . Pneumonia Sister   . Heart disease Brother   . Diabetes Brother   . Diabetes Sister   . Heart disease Sister   . Congestive Heart Failure Sister   . Breast cancer Neg Hx      Social History   Tobacco Use  . Smoking status: Never Smoker  . Smokeless tobacco: Never Used  . Tobacco comment: smoking cessation materials not required  Substance Use Topics  . Alcohol use: Not Currently    Alcohol/week: 0.0 standard drinks    With staff assistance, above reviewed with the patient today.  ROS: As per HPI, otherwise no specific complaints on a limited and focused system review   No results found for this or any previous visit (from the past 72 hour(s)).   PHQ2/9: Depression screen [facility] 2/9 07/17/2020 03/20/2020 03/18/2020 02/14/2020  01/01/2020  Decreased Interest 0 0 0 0 0  Down, Depressed, Hopeless 0 0 1 0 0  PHQ - 2 Score 0 0 1 0 0  Altered sleeping 0 - 0 - 0  Tired, decreased energy 0 - 0 - 0  Change in appetite 0 - 0 - 0  Feeling bad or failure about yourself  0 - 0 - 0  Trouble concentrating 0 - 0 - 0  Moving slowly or fidgety/restless 0 - 0 - 0  Suicidal thoughts 0 - 0 - 0  PHQ-9 Score 0 - 1 - 0  Difficult doing work/chores Not difficult at all - Not difficult at all - Not difficult at all  Some recent data might be hidden   PHQ-2/9 Result is  neg  Fall Risk: Fall Risk  07/17/2020 03/20/2020 03/18/2020 02/14/2020 01/01/2020  Falls in the past year? 0 0 0 0 0  Number falls in past yr: 0 - 0 - 0  Injury with Fall? 0 - 0 - 0  Risk for fall due to : - - - - -  Risk for fall due to: Comment - - - - -  Follow up - - - - Falls evaluation completed      Objective:   Vitals:   07/17/20 1033  BP: 122/80  Pulse: 72  Resp: 16  Temp: 98 F (36.7 C)  TempSrc: Temporal  SpO2: 99%  Weight: 209 lb 11.2 oz (95.1 kg)  Height: 5\' 9"  (1.753 m)    Body mass index is 30.97 kg/m.  Physical Exam   NAD, masked, very pleasant HEENT - [place]/AT, sclera anicteric, PERRL, positive glasses, conj - non-inj'ed, pharynx clear Neck - supple, no adenopathy,  Car - RRR without m/g/r Pulm- RR and effort normal at rest, CTA without wheeze or rales Abd - soft, mildly tender right of the umbilicus area and slightly extending towards the right upper quadrant, although not marked, with no rebound or guarding present, ND, BS+,  no masses, no HSM, no marked umbilical hernia concern noted on exam today Back - no CVA tenderness Ext - no LE edema,  Neuro/psychiatric - affect was not flat, appropriate with conversation  Alert with speech normal  Results for orders placed or performed in visit on 03/18/20  COMPLETE METABOLIC PANEL WITH GFR  Result Value Ref Range   Glucose, Bld 102 (H) 65 - 99 mg/dL   BUN 12 7 - 25 mg/dL   Creat 0.85  0.60 - 0.93 mg/dL   GFR, Est Non African American 69 > OR = 60 mL/min/1.93m2   GFR, Est African American 80 > OR = 60 mL/min/1.44m2   BUN/Creatinine Ratio NOT APPLICABLE 6 - 22 (calc)   Sodium 142 135 - 146 mmol/L   Potassium 4.8 3.5 - 5.3 mmol/L   Chloride 106 98 - 110 mmol/L   CO2 29 20 - 32 mmol/L   Calcium 9.6 8.6 - 10.4 mg/dL   Total Protein 7.1 6.1 - 8.1 g/dL   Albumin 4.0 3.6 - 5.1 g/dL   Globulin 3.1 1.9 - 3.7 g/dL (calc)   AG Ratio 1.3 1.0 - 2.5 (calc)   Total Bilirubin 0.6 0.2 - 1.2 mg/dL   Alkaline phosphatase (APISO) 89 37 - 153 U/L   AST 16 10 - 35 U/L   ALT 13 6 - 29 U/L  Lipid panel  Result Value Ref Range   Cholesterol 167 <200 mg/dL   HDL 58 > OR = 50 mg/dL   Triglycerides 113 <150 mg/dL   LDL Cholesterol (Calc) 88 mg/dL (calc)   Total CHOL/HDL Ratio 2.9 <5.0 (calc)   Non-HDL Cholesterol (Calc) 109 <130 mg/dL (calc)  Hemoglobin A1c  Result Value Ref Range   Hgb A1c MFr Bld 5.6 <5.7 % of total Hgb   Mean Plasma Glucose 114 (calc)   eAG (mmol/L) 6.3 (calc)       Assessment & Plan:   1. Abdominal pain, unspecified abdominal location,  2. Irritable bowel syndrome with diarrhea Patient notes a history in her past of irritable bowel syndrome, and notes the high ostomy product has been helpful for her on occasions when she has some discomfort with some diarrhea that then arises. She has used it very sparingly in the past. Do feel it is  reasonable to refill the medicine for her today, and continue to monitor.  - Hyoscyamine Sulfate 0.375 MG TBCR; Take 1 tablet (0.375 mg total) by mouth 2 (two) times daily as needed.  Dispense: 100 tablet; Refill: 2   We will follow-up if symptoms more problematic over time, or other symptoms of concern develop in association as was discussed today. She will keep her planned follow-up in October with Leisa.    Towanda Malkin, MD 07/17/20 11:07 AM

## 2020-07-17 ENCOUNTER — Ambulatory Visit (INDEPENDENT_AMBULATORY_CARE_PROVIDER_SITE_OTHER): Payer: Medicare HMO | Admitting: Internal Medicine

## 2020-07-17 ENCOUNTER — Encounter: Payer: Self-pay | Admitting: Internal Medicine

## 2020-07-17 ENCOUNTER — Other Ambulatory Visit: Payer: Self-pay

## 2020-07-17 VITALS — BP 122/80 | HR 72 | Temp 98.0°F | Resp 16 | Ht 69.0 in | Wt 209.7 lb

## 2020-07-17 DIAGNOSIS — R109 Unspecified abdominal pain: Secondary | ICD-10-CM

## 2020-07-17 DIAGNOSIS — K580 Irritable bowel syndrome with diarrhea: Secondary | ICD-10-CM | POA: Diagnosis not present

## 2020-07-17 MED ORDER — HYOSCYAMINE SULFATE ER 0.375 MG PO TBCR: 1.0000 | EXTENDED_RELEASE_TABLET | Freq: Two times a day (BID) | ORAL | 2 refills | Status: DC | PRN

## 2020-07-17 NOTE — Patient Instructions (Addendum)
The medication was refilled today and can continue to use as needed as you have been doing.

## 2020-08-13 ENCOUNTER — Other Ambulatory Visit: Payer: Self-pay

## 2020-08-13 DIAGNOSIS — Z1231 Encounter for screening mammogram for malignant neoplasm of breast: Secondary | ICD-10-CM

## 2020-08-20 ENCOUNTER — Ambulatory Visit (INDEPENDENT_AMBULATORY_CARE_PROVIDER_SITE_OTHER): Payer: Medicare HMO

## 2020-08-20 ENCOUNTER — Other Ambulatory Visit: Payer: Self-pay

## 2020-08-20 VITALS — BP 120/70 | HR 72 | Temp 97.8°F | Resp 16 | Ht 69.0 in | Wt 209.8 lb

## 2020-08-20 DIAGNOSIS — Z0000 Encounter for general adult medical examination without abnormal findings: Secondary | ICD-10-CM

## 2020-08-20 DIAGNOSIS — Z780 Asymptomatic menopausal state: Secondary | ICD-10-CM

## 2020-08-20 DIAGNOSIS — Z23 Encounter for immunization: Secondary | ICD-10-CM | POA: Diagnosis not present

## 2020-08-20 NOTE — Progress Notes (Signed)
Subjective:   Ruth Bishop is a 74 y.o. female who presents for Medicare Annual (Subsequent) preventive examination.  Review of Systems     Cardiac Risk Factors include: advanced age (>50men, >41 women);dyslipidemia;obesity (BMI >30kg/m2);hypertension     Objective:    Today's Vitals   08/20/20 0944  BP: 120/70  Pulse: 72  Resp: 16  Temp: 97.8 F (36.6 C)  TempSrc: Oral  SpO2: 99%  Weight: 209 lb 12.8 oz (95.2 kg)  Height: 5\' 9"  (1.753 m)   Body mass index is 30.98 kg/m.  Advanced Directives 08/20/2020 08/17/2019 06/28/2019 08/05/2018 08/04/2017 01/29/2017 01/13/2017  Does Patient Have a Medical Advance Directive? Yes Yes No Yes Yes Yes Yes  Type of Paramedic of [place];Living will [place];Living will - [place];Living will - - -  Does patient want to make changes to medical advance directive? - - - - - - -  Copy of [place] in Chart? No - copy requested No - copy requested - No - copy requested - - -  Would patient like information on creating a medical advance directive? - - No - Patient declined - - - -    Current Medications (verified) Outpatient Encounter Medications as of 08/20/2020  Medication Sig  . albuterol (VENTOLIN HFA) 108 (90 Base) MCG/ACT inhaler Inhale 2 puffs into the lungs every 4 (four) hours as needed for wheezing or shortness of breath.  Marland Kitchen aspirin EC 81 MG tablet Take 81 mg by mouth daily.  . Calcium Carbonate-Vitamin D (CALTRATE 600+D) 600-400 MG-UNIT per tablet Take 1 tablet by mouth daily.   Marland Kitchen docusate sodium (COLACE) 250 MG capsule Take 250 mg by mouth daily.  . famotidine (PEPCID) 20 MG tablet Take 1 tablet (20 mg total) by mouth 2 (two) times daily as needed for heartburn or indigestion.  Marland Kitchen Hyoscyamine Sulfate 0.375 MG TBCR Take 1 tablet (0.375 mg total) by mouth 2 (two) times daily as needed.  Marland Kitchen losartan-hydrochlorothiazide (HYZAAR) 100-12.5 MG tablet Take 0.5  tablets by mouth daily.  . MULTIPLE VITAMIN PO Take 1 tablet by mouth daily.   . pantoprazole (PROTONIX) 40 MG tablet Take 1 tablet (40 mg total) by mouth daily as needed.  . psyllium (METAMUCIL) 0.52 g capsule Take 0.52 g by mouth daily.  Marland Kitchen triamcinolone cream (KENALOG) 0.1 % Apply 1 application topically 2 (two) times daily. If needed  . TURMERIC PO Take 1 tablet by mouth daily.    No facility-administered encounter medications on file as of 08/20/2020.    Allergies (verified) Nitrofurantoin monohyd macro   History: Past Medical History:  Diagnosis Date  . Abnormal chest x-ray 10/18/2018  . Arthritis   . Chronic pain 05/24/2015  . Diastolic dysfunction 5/80/9983  . GERD (gastroesophageal reflux disease)   . Heart murmur   . Hormone replacement therapy 05/26/2016  . Hypertension   . Incidental lung nodule, > 72mm and < 97mm 08/04/2017   4 mm ground glass nodule LLL incidentally noted on CT scan Nov 2016  . Osteoarthritis of both knees 06/13/2015   [facility]   . Post menopausal syndrome 05/24/2015  . Skin-picking disorder 05/24/2015   when nervous/stressed chews on fingers and peels down skin, started with cuticles but now hypopigmentation of fingers down to palm in some areas with lifetime of skin picking.    Past Surgical History:  Procedure Laterality Date  . ABDOMINAL HYSTERECTOMY    . COLONOSCOPY WITH PROPOFOL N/A 10/01/2015  Procedure: COLONOSCOPY WITH PROPOFOL;  Surgeon: Lollie Sails, MD;  Location[facility] END[facility]COPY;  Service: Endoscopy;  Laterality: N/A;  . ESOPHAGOGASTRODUODENOSCOPY (EGD) WITH PROPOFOL N/A 10/01/2015   Procedure: ESOPHAGOGASTRODUODENOSCOPY (EGD) WITH PROPOFOL;  Surgeon: Lollie Sails, MD;  Location[facility] ENDOSCOPY;  Service: Endoscopy;  Laterality: N/A;   Family History  Problem Relation Age of Onset  . Heart disease Mother   . Diabetes Mother   . Aneurysm Mother   . Heart disease Father   . Heart attack Father   . Heart disease Sister     . Diabetes Sister   . Pneumonia Sister   . Heart disease Brother   . Diabetes Brother   . Diabetes Sister   . Heart disease Sister   . Congestive Heart Failure Sister   . Breast cancer Neg Hx    Social History   Socioeconomic History  . Marital status: Widowed    Spouse name: Not on file  . Number of children: 1  . Years of education: Not on file  . Highest education level: Master's degree (e.g., MA, MS, MEng, MEd, MSW, MBA)  Occupational History  . Occupation: Retired  Tobacco Use  . Smoking status: Never Smoker  . Smokeless tobacco: Never Used  . Tobacco comment: smoking cessation materials not required  Vaping Use  . Vaping Use: Never used  Substance and Sexual Activity  . Alcohol use: Not Currently    Alcohol/week: 0.0 standard drinks  . Drug use: No  . Sexual activity: Not Currently  Other Topics Concern  . Not on file  Social History Narrative   Pt lives alone   Social Determinants of Health   Financial Resource Strain: Low Risk   . Difficulty of Paying Living Expenses: Not hard at all  Food Insecurity: No Food Insecurity  . Worried About Charity fundraiser in the Last Year: Never true  . Ran Out of Food in the Last Year: Never true  Transportation Needs: No Transportation Needs  . Lack of Transportation (Medical): No  . Lack of Transportation (Non-Medical): No  Physical Activity: Sufficiently Active  . Days of Exercise per Week: 2 days  . Minutes of Exercise per Session: 100 min  Stress: No Stress Concern Present  . Feeling of Stress : Only a little  Social Connections: Moderately Isolated  . Frequency of Communication with Friends and Family: More than three times a week  . Frequency of Social Gatherings with Friends and Family: More than three times a week  . Attends Religious Services: More than 4 times per year  . Active Member of Clubs or Organizations: No  . Attends Archivist Meetings: Never  . Marital Status: Widowed    Tobacco  Counseling Counseling given: Not Answered Comment: smoking cessation materials not required   Clinical Intake:  Pre-visit preparation completed: Yes  Pain : No/denies pain     BMI - recorded: 30.98 Nutritional Status: BMI > 30  Obese Nutritional Risks: None Diabetes: No  How often do you need to have someone help you when you read instructions, pamphlets, or other written materials from your doctor or pharmacy?: 1 - Never    Interpreter Needed?: No  Information entered by :: Clemetine Marker LPN   Activities of Daily Living In your present state of health, do you have any difficulty performing the following activities: 08/20/2020 07/17/2020  Hearing? N N  Comment declines hearing aids -  Vision? N N  Difficulty concentrating or making decisions? N N  Walking or climbing stairs? N N  Dressing or bathing? N N  Doing errands, shopping? N N  Preparing Food and eating ? N -  Using the Toilet? N -  In the past six months, have you accidently leaked urine? N -  Do you have problems with loss of bowel control? N -  Managing your Medications? N -  Managing your Finances? N -  Housekeeping or managing your Housekeeping? N -  Some recent data might be hidden    Patient Care Team: Delsa Grana, PA-C as PCP - General (Family Medicine) Lahoma Rocker, MD as Consulting Physician (Rheumatology) Minna Merritts, MD as Consulting Physician (Cardiology)  Indicate any recent Medical Services you may have received from other than [facility] providers in the past year (date may be approximate).     Assessment:   This is a routine wellness examination for Ruth Bishop.  Hearing/Vision screen  Hearing Screening   125Hz  250Hz  500Hz  1000Hz  2000Hz  3000Hz  4000Hz  6000Hz  8000Hz   Right ear:           Left ear:           Comments: Pt denies hearing difficulty  Vision Screening Comments: Annual vision screenings at [facility].   Dietary issues and exercise activities discussed: Current  Exercise Habits: Home exercise routine, Type of exercise: calisthenics;stretching;Other - see comments (tennis), Time (Minutes): > 60, Frequency (Times/Week): 2, Weekly Exercise (Minutes/Week): 0, Intensity: Moderate, Exercise limited by: None identified  Goals    .  "I would like to lose weight - down to under 200#" (pt-stated)      Current Barriers:  Marland Kitchen Knowledge Deficits related to plan of care for weight management in patient with pre-diabetes, HTN, and osteoarthritis of multiple joints   Case Manager Clinical Goal(s):  Marland Kitchen Over the next 90 days, patient will continue adhering to plan for weight loss and maintenance . Over the next 90 days, patient will work with [facility] to address needs related to weight loss and maintenance.   Interventions:  . Evaluation of current treatment plan related to chronic management of HTN and pre-diabetes and patient's adherence to plan as established by provider.  . Advised patient to continue exercise regimen. Reports performing strengthening exercises and walking several times a week. . Reviewed medications with patient and discussed importance of adherence to prescribed medications.  . Discussed plans with patient for ongoing care management follow up and provided patient with direct contact information for care management team  Patient Self Care Activities:  . Self administers medications . Attends scheduled provider appointments . Calls pharmacy for medication refills . Attends church or other social activities . Performs ADL's independently . Performs IADL's independently . Calls provider office for new concerns or questions   Please see past updates related to this goal by clicking on the "Past Updates" button in the selected goal      .  BP Management      Current Barriers:  Marland Kitchen Knowledge Deficits related to long term management of HTN.   Case Manager Clinical Goal(s):  Marland Kitchen Over the next 30 days, patient will verbalize understanding of plan for  long term self health management of HTN . Over the next 60 days, patient will work with care team to address needs related to Hypertension management . Over the next 60 days, patient will demonstrate improved health management independence as evidenced byself monitoring/recording, verbalization of established parameters. . Over the next 90 days, patient will monitor her blood pressure and record readings. Marland Kitchen  Over the next 90 days, patient will continue adhering to a cardiac prudent/heart healthy diet.   Interventions:  . Provided education regarding BP readings and parameters, cardiac prudent diet, and potential complications of heart disease. Encouraged to monitor daily and record readings. Discussed parameters for notifying provider. Reports blood pressure readings have been within established parameters. . Encouraged to continue activities such as walking and mild exercising when tolerated. Reports exercising and playing tennis regularly. . Encouraged to continue adherence with a cardiac prudent/heart healthy diet. Reports continued compliance with heart healthy diet.   Patient Self Care Activities:  . Self administers medications . Attends scheduled provider appointments . Calls pharmacy for medication refills . Attends church or other social activities . Performs ADL's independently . Performs IADL's independently . Calls provider office for new concerns or questions   Please see past updates related to this goal by clicking on the "Past Updates" button in the selected goal  .      Depression Screen PHQ 2/9 Scores 08/20/2020 07/17/2020 03/20/2020 03/18/2020 02/14/2020 01/01/2020 08/17/2019  PHQ - 2 Score 2 0 0 1 0 0 0  PHQ- 9 Score 4 0 - 1 - 0 -    Fall Risk Fall Risk  08/20/2020 07/17/2020 03/20/2020 03/18/2020 02/14/2020  Falls in the past year? 0 0 0 0 0  Number falls in past yr: 0 0 - 0 -  Injury with Fall? 0 0 - 0 -  Risk for fall due to : No Fall Risks - - - -  Risk for fall due  to: Comment - - - - -  Follow up Falls prevention discussed - - - -    Any stairs in or around the home? No  If so, are there any without handrails? No    Home free of loose throw rugs in walkways, pet beds, electrical cords, etc? Yes  Adequate lighting in your home to reduce risk of falls? Yes   ASSISTIVE DEVICES UTILIZED TO PREVENT FALLS:  Life alert? No  Use of a cane, walker or w/c? No  Grab bars in the bathroom? Yes  Shower chair or bench in shower? Yes  Elevated toilet seat or a handicapped toilet? No   TIMED UP AND GO:  Was the test performed? Yes .  Length of time to ambulate 10 feet: 5 sec.   Gait steady and fast without use of assistive device  Cognitive Function:     6CIT Screen 08/17/2019 08/05/2018  What Year? 0 points 0 points  What month? 0 points 0 points  What time? 0 points 0 points  Count back from 20 0 points 0 points  Months in reverse 0 points 0 points  Repeat phrase 0 points 4 points  Total Score 0 4    Immunizations Immunization History  Administered Date(s) Administered  . Fluad Quad(high Dose 65+) 08/17/2019, 08/20/2020  . Influenza, High Dose Seasonal PF 08/04/2017, 08/05/2018  . Influenza, Seasonal, Injecte, Preservative Fre 10/10/2014  . Influenza-Unspecified 10/23/2015, 10/06/2016  . PFIZER SARS-COV-2 Vaccination 01/29/2020, 02/19/2020  . Pneumococcal Conjugate-13 10/10/2014  . Pneumococcal Polysaccharide-23 05/24/2015, 10/23/2015  . Tdap 05/24/2015  . Zoster Recombinat (Shingrix) 08/04/2019, 10/07/2019    TDAP status: Up to date   Flu Vaccine status: Completed at today's visit   Pneumococcal vaccine status: Up to date   Covid-19 vaccine status: Completed vaccines  Qualifies for Shingles Vaccine? Yes   Zostavax completed Yes   Shingrix Completed?: Yes  Screening Tests Health Maintenance  Topic Date Due  .  MAMMOGRAM  09/26/2020  . TETANUS/TDAP  05/23/2025  . COLONOSCOPY  09/30/2025  . INFLUENZA VACCINE  Completed  . DEXA  SCAN  Completed  . COVID-19 Vaccine  Completed  . Hepatitis C Screening  Completed  . PNA vac Low Risk Adult  Completed    Health Maintenance  There are no preventive care reminders to display for this patient.  Colorectal cancer screening: Completed 10/01/15. Repeat every 10 years   Mammogram status: Completed 09/27/19. Repeat every year. Ordered 08/13/20.  Bone Density status: Completed 08/25/18. Results reflect: Bone density results: NORMAL. Repeat every 2 years.  Lung Cancer Screening: (Low Dose CT Chest recommended if Age 4-80 years, 30 pack-year currently smoking OR have quit w/in 15years.) does not qualify. Pt seen by lung nodule clinic.   Additional Screening:  Hepatitis C Screening: does qualify; Completed 06/13/15.   Vision Screening: Recommended annual ophthalmology exams for early detection of glaucoma and other disorders of the eye. Is the patient up to date with their annual eye exam?  Yes  Who is the provider or what is the name of the office in which the patient attends annual eye exams? [facility]   Dental Screening: Recommended annual dental exams for proper oral hygiene  Community Resource Referral / Chronic Care Management: CRR required this visit?  No   CCM required this visit?  No      Plan:     I have personally reviewed and noted the following in the patient's chart:   . Medical and social history . Use of alcohol, tobacco or illicit drugs  . Current medications and supplements . Functional ability and status . Nutritional status . Physical activity . Advanced directives . List of other physicians . Hospitalizations, surgeries, and ER visits in previous 12 months . Vitals . Screenings to include cognitive, depression, and falls . Referrals and appointments  In addition, I have reviewed and discussed with patient certain preventive protocols, quality metrics, and best practice recommendations. A written personalized care plan for  preventive services as well as general preventive health recommendations were provided to patient.     Clemetine Marker, LPN   05/03/7823   Nurse Notes: pt doing well and appreciative of visit today.

## 2020-08-20 NOTE — Patient Instructions (Signed)
Ruth Bishop , Thank you for taking time to come for your Medicare Wellness Visit. I appreciate your ongoing commitment to your health goals. Please review the following plan we discussed and let me know if I can assist you in the future.   Screening recommendations/referrals: Colonoscopy: done 10/01/15. Repeat in 2026. Mammogram: done 09/27/19. Please call [phone] to schedule your mammogram.  Bone Density: done 08/25/18. Recommended yearly ophthalmology/optometry visit for glaucoma screening and checkup Recommended yearly dental visit for hygiene and checkup  Vaccinations: Influenza vaccine: done today Pneumococcal vaccine: done 10/23/15 Tdap vaccine: done 05/24/15 Shingles vaccine: done 08/04/19 & 10/07/19   Covid-19:done 01/29/20 & 02/19/20  Advanced directives: Please bring a copy of your health care power of attorney and living will to the office at your convenience.  Conditions/risks identified: Keep up the great work!  Next appointment: Follow up in one year for your annual wellness visit    Preventive Care 65 Years and Older, Female Preventive care refers to lifestyle choices and visits with your health care provider that can promote health and wellness. What does preventive care include?  A yearly physical exam. This is also called an annual well check.  Dental exams once or twice a year.  Routine eye exams. Ask your health care provider how often you should have your eyes checked.  Personal lifestyle choices, including:  Daily care of your teeth and gums.  Regular physical activity.  Eating a healthy diet.  Avoiding tobacco and drug use.  Limiting alcohol use.  Practicing safe sex.  Taking low-dose aspirin every day.  Taking vitamin and mineral supplements as recommended by your health care provider. What happens during an annual well check? The services and screenings done by your health care provider during your annual well check will depend on your age,  overall health, lifestyle risk factors, and family history of disease. Counseling  Your health care provider may ask you questions about your:  Alcohol use.  Tobacco use.  Drug use.  Emotional well-being.  Home and relationship well-being.  Sexual activity.  Eating habits.  History of falls.  Memory and ability to understand (cognition).  Work and work Statistician.  Reproductive health. Screening  You may have the following tests or measurements:  Height, weight, and BMI.  Blood pressure.  Lipid and cholesterol levels. These may be checked every 5 years, or more frequently if you are over 60 years old.  Skin check.  Lung cancer screening. You may have this screening every year starting at age 71 if you have a 30-pack-year history of smoking and currently smoke or have quit within the past 15 years.  Fecal occult blood test (FOBT) of the stool. You may have this test every year starting at age 33.  Flexible sigmoidoscopy or colonoscopy. You may have a sigmoidoscopy every 5 years or a colonoscopy every 10 years starting at age 53.  Hepatitis C blood test.  Hepatitis B blood test.  Sexually transmitted disease (STD) testing.  Diabetes screening. This is done by checking your blood sugar (glucose) after you have not eaten for a while (fasting). You may have this done every 1-3 years.  Bone density scan. This is done to screen for osteoporosis. You may have this done starting at age 72.  Mammogram. This may be done every 1-2 years. Talk to your health care provider about how often you should have regular mammograms. Talk with your health care provider about your test results, treatment options, and if necessary, the need for  more tests. Vaccines  Your health care provider may recommend certain vaccines, such as:  Influenza vaccine. This is recommended every year.  Tetanus, diphtheria, and acellular pertussis (Tdap, Td) vaccine. You may need a Td booster every 10  years.  Zoster vaccine. You may need this after age 34.  Pneumococcal 13-valent conjugate (PCV13) vaccine. One dose is recommended after age 1.  Pneumococcal polysaccharide (PPSV23) vaccine. One dose is recommended after age 11. Talk to your health care provider about which screenings and vaccines you need and how often you need them. This information is not intended to replace advice given to you by your health care provider. Make sure you discuss any questions you have with your health care provider. Document Released: 12/20/2015 Document Revised: 08/12/2016 Document Reviewed: 09/24/2015 Elsevier Interactive Patient Education  2017 [employer] Prevention in the Home Falls can cause injuries. They can happen to people of all ages. There are many things you can do to make your home safe and to help prevent falls. What can I do on the outside of my home?  Regularly fix the edges of walkways and driveways and fix any cracks.  Remove anything that might make you trip as you walk through a door, such as a raised step or threshold.  Trim any bushes or trees on the path to your home.  Use bright outdoor lighting.  Clear any walking paths of anything that might make someone trip, such as rocks or tools.  Regularly check to see if handrails are loose or broken. Make sure that both sides of any steps have handrails.  Any raised decks and porches should have guardrails on the edges.  Have any leaves, snow, or ice cleared regularly.  Use sand or salt on walking paths during winter.  Clean up any spills in your garage right away. This includes oil or grease spills. What can I do in the bathroom?  Use night lights.  Install grab bars by the toilet and in the tub and shower. Do not use towel bars as grab bars.  Use non-skid mats or decals in the tub or shower.  If you need to sit down in the shower, use a plastic, non-slip stool.  Keep the floor dry. Clean up any water that  spills on the floor as soon as it happens.  Remove soap buildup in the tub or shower regularly.  Attach bath mats securely with double-sided non-slip rug tape.  Do not have throw rugs and other things on the floor that can make you trip. What can I do in the bedroom?  Use night lights.  Make sure that you have a light by your bed that is easy to reach.  Do not use any sheets or blankets that are too big for your bed. They should not hang down onto the floor.  Have a firm chair that has side arms. You can use this for support while you get dressed.  Do not have throw rugs and other things on the floor that can make you trip. What can I do in the kitchen?  Clean up any spills right away.  Avoid walking on wet floors.  Keep items that you use a lot in easy-to-reach places.  If you need to reach something above you, use a strong step stool that has a grab bar.  Keep electrical cords out of the way.  Do not use floor polish or wax that makes floors slippery. If you must use wax, use non-skid  floor wax.  Do not have throw rugs and other things on the floor that can make you trip. What can I do with my stairs?  Do not leave any items on the stairs.  Make sure that there are handrails on both sides of the stairs and use them. Fix handrails that are broken or loose. Make sure that handrails are as long as the stairways.  Check any carpeting to make sure that it is firmly attached to the stairs. Fix any carpet that is loose or worn.  Avoid having throw rugs at the top or bottom of the stairs. If you do have throw rugs, attach them to the floor with carpet tape.  Make sure that you have a light switch at the top of the stairs and the bottom of the stairs. If you do not have them, ask someone to add them for you. What else can I do to help prevent falls?  Wear shoes that:  Do not have high heels.  Have rubber bottoms.  Are comfortable and fit you well.  Are closed at the  toe. Do not wear sandals.  If you use a stepladder:  Make sure that it is fully opened. Do not climb a closed stepladder.  Make sure that both sides of the stepladder are locked into place.  Ask someone to hold it for you, if possible.  Clearly mark and make sure that you can see:  Any grab bars or handrails.  First and last steps.  Where the edge of each step is.  Use tools that help you move around (mobility aids) if they are needed. These include:  Canes.  Walkers.  Scooters.  Crutches.  Turn on the lights when you go into a dark area. Replace any light bulbs as soon as they burn out.  Set up your furniture so you have a clear path. Avoid moving your furniture around.  If any of your floors are uneven, fix them.  If there are any pets around you, be aware of where they are.  Review your medicines with your doctor. Some medicines can make you feel dizzy. This can increase your chance of falling. Ask your doctor what other things that you can do to help prevent falls. This information is not intended to replace advice given to you by your health care provider. Make sure you discuss any questions you have with your health care provider. Document Released: 09/19/2009 Document Revised: 04/30/2016 Document Reviewed: 12/28/2014 Elsevier Interactive Patient Education  2017 [employer].

## 2020-09-10 ENCOUNTER — Other Ambulatory Visit: Payer: Self-pay

## 2020-09-10 ENCOUNTER — Ambulatory Visit
Admission: RE | Admit: 2020-09-10 | Discharge: 2020-09-10 | Disposition: A | Discharge status: Home / Self Care | Payer: Medicare HMO | Source: Ambulatory Visit | Attending: Oncology | Admitting: Oncology

## 2020-09-10 DIAGNOSIS — R911 Solitary pulmonary nodule: Secondary | ICD-10-CM | POA: Diagnosis not present

## 2020-09-10 DIAGNOSIS — R918 Other nonspecific abnormal finding of lung field: Secondary | ICD-10-CM | POA: Diagnosis not present

## 2020-09-11 ENCOUNTER — Inpatient Hospital Stay: Payer: Medicare HMO | Attending: Oncology | Admitting: Oncology

## 2020-09-11 DIAGNOSIS — R911 Solitary pulmonary nodule: Secondary | ICD-10-CM | POA: Diagnosis not present

## 2020-09-11 NOTE — Progress Notes (Signed)
Pulmonary Nodule Clinic Consult note [facility]  [phone] [phone]  Patient Care Team: Delsa Grana, PA-C as PCP - General (Family Medicine) Lahoma Rocker, MD as Consulting Physician (Rheumatology) Minna Merritts, MD as Consulting Physician (Cardiology)   Name of the patient: Ruth Bishop  [id number]  Jun 12, 1946   Date of visit: 09/11/2020   Diagnosis- Lung Nodule  Virtual Visit via Telephone Note   I connected with Ruth Bishop on 09/11/20 at 11 am by telephone visit and verified that I am speaking with the correct person using two identifiers.   I discussed the limitations, risks, security and privacy concerns of performing an evaluation and management service by telemedicine and the availability of in-person appointments. I also discussed with the patient that there may be a patient responsible charge related to this service. The patient expressed understanding and agreed to proceed.   Other persons participating in the visit and their role in the encounter:   Patient's location: Home  Provider's location: Office  Chief complaint/ Reason for visit- Pulmonary Nodule Clinic Initial Visit  Past Medical History:  Patient is managed/referred by her PCP Dr. Sanda Klein.  Initial CT scan completed on 08/30/2018 revealed a part solid nodule within the medial aspect of the left upper lobe measuring 1.3 cm with a central solid component at 5 mm.  Previous imaging from 02/17/2018 and 08/18/2017 revealed stable subsolid left upper lobe nodule measuring 5 mm and 4 mm.  Per Fleischner's guidelines, CT scan with out contrast is recommended in 12 months and if stable repeat CT scan at 18-24 months to ensure stability.  She was seen in our pulmonary nodule clinic on 09/07/2019.  We reviewed most recent CT scan which showed stable part solid nodule in left upper lobe with other scattered small nodules that appeared stable.  Radiologist recommended  follow-up in 12 months to confirm stability if unchanged and solid component remains less than 6 mm annual CT is recommended until 5 years of stability has been established.  If solid component is larger than 6 mm this could be considered highly suspicious for malignancy.  She was scheduled for a 72-month follow-up with CT scan.  Interval history-after epic chart review, Ruth Bishop has a past medical history of:  Past Medical History:  Diagnosis Date  . Abnormal chest x-ray 10/18/2018  . Arthritis   . Chronic pain 05/24/2015  . Diastolic dysfunction 03/08/271  . GERD (gastroesophageal reflux disease)   . Heart murmur   . Hormone replacement therapy 05/26/2016  . Hypertension   . Incidental lung nodule, > 7mm and < 58mm 08/04/2017   4 mm ground glass nodule LLL incidentally noted on CT scan Nov 2016  . Osteoarthritis of both knees 06/13/2015   [facility]   . Post menopausal syndrome 05/24/2015  . Skin-picking disorder 05/24/2015   when nervous/stressed chews on fingers and peels down skin, started with cuticles but now hypopigmentation of fingers down to palm in some areas with lifetime of skin picking.    She has a past surgical history of: Past Surgical History:  Procedure Laterality Date  . ABDOMINAL HYSTERECTOMY    . COLONOSCOPY WITH PROPOFOL N/A 10/01/2015   Procedure: COLONOSCOPY WITH PROPOFOL;  Surgeon: Lollie Sails, MD;  Location: [facility] ENDOSCOPY;  Service: Endoscopy;  Laterality: N/A;  . ESOPHAGOGASTRODUODENOSCOPY (EGD) WITH PROPOFOL N/A 10/01/2015   Procedure: ESOPHAGOGASTRODUODENOSCOPY (EGD) WITH PROPOFOL;  Surgeon: Lollie Sails, MD;  Location: [facility] ENDOSCOPY;  Service: Endoscopy;  Laterality: N/A;  She currently does not smoke and has never smoked or used smokeless tobacco.  She is retired.  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  She lives at home alone.  She is currently divorced.  She has 1 daughter and 1  granddaughter.  She does not know much about her past family history.  She was adopted and lives at with her adopted family.  She does remember that her dad died of "cardiac problem" and her mom died of an "aneurysm".  She believes her aunt had uterine cancer but otherwise does not know of additional cancers.  In the interim, she has been doing well.  She denies any shortness of breath, cough or chest pain.  She denies any recent fevers or illness, easy bleeding or bruising, reports a good appetite and denies any weight loss.  She denies any nausea vomiting constipation or diarrhea.  She denies any urinary concerns.  She is fully vaccinated from Covid and just recently got her booster.  She started to travel again after all of the restrictions due to Covid.  ECOG FS:0 - Asymptomatic  Review of systems- Review of Systems  Constitutional: Negative.  Negative for chills, fever, malaise/fatigue and weight loss.  HENT: Negative for congestion, ear pain and tinnitus.   Eyes: Negative.  Negative for blurred vision and double vision.  Respiratory: Negative.  Negative for cough, sputum production and shortness of breath.   Cardiovascular: Negative.  Negative for chest pain, palpitations and leg swelling.  Gastrointestinal: Negative.  Negative for abdominal pain, constipation, diarrhea, nausea and vomiting.  Genitourinary: Negative for dysuria, frequency and urgency.  Musculoskeletal: Negative for back pain and falls.  Skin: Negative.  Negative for rash.  Neurological: Negative.  Negative for weakness and headaches.  Endo/Heme/Allergies: Negative.  Does not bruise/bleed easily.  Psychiatric/Behavioral: Negative.  Negative for depression. The patient is not nervous/anxious and does not have insomnia.      Allergies  Allergen Reactions  . Nitrofurantoin Monohyd Macro      Past Medical History:  Diagnosis Date  . Abnormal chest x-ray 10/18/2018  . Arthritis   . Chronic pain 05/24/2015  . Diastolic  dysfunction 3/41/9622  . GERD (gastroesophageal reflux disease)   . Heart murmur   . Hormone replacement therapy 05/26/2016  . Hypertension   . Incidental lung nodule, > 23mm and < 39mm 08/04/2017   4 mm ground glass nodule LLL incidentally noted on CT scan Nov 2016  . Osteoarthritis of both knees 06/13/2015   [facility]   . Post menopausal syndrome 05/24/2015  . Skin-picking disorder 05/24/2015   when nervous/stressed chews on fingers and peels down skin, started with cuticles but now hypopigmentation of fingers down to palm in some areas with lifetime of skin picking.      Past Surgical History:  Procedure Laterality Date  . ABDOMINAL HYSTERECTOMY    . COLONOSCOPY WITH PROPOFOL N/A 10/01/2015   Procedure: COLONOSCOPY WITH PROPOFOL;  Surgeon: Lollie Sails, MD;  Location: [facility] ENDOSCOPY;  Service: Endoscopy;  Laterality: N/A;  . ESOPHAGOGASTRODUODENOSCOPY (EGD) WITH PROPOFOL N/A 10/01/2015   Procedure: ESOPHAGOGASTRODUODENOSCOPY (EGD) WITH PROPOFOL;  Surgeon: Lollie Sails, MD;  Location: [facility] ENDOSCOPY;  Service: Endoscopy;  Laterality: N/A;    Social History   Socioeconomic History  . Marital status: Widowed    Spouse name: Not on file  . Number of children: 1  . Years of education: Not on file  . Highest education level: Master's  degree (e.g., MA, MS, MEng, MEd, MSW, MBA)  Occupational History  . Occupation: Retired  Tobacco Use  . Smoking status: Never Smoker  . Smokeless tobacco: Never Used  . Tobacco comment: smoking cessation materials not required  Vaping Use  . Vaping Use: Never used  Substance and Sexual Activity  . Alcohol use: Not Currently    Alcohol/week: 0.0 standard drinks  . Drug use: No  . Sexual activity: Not Currently  Other Topics Concern  . Not on file  Social History Narrative   Pt lives alone   Social Determinants of Health   Financial Resource Strain: Low Risk   . Difficulty of Paying Living Expenses: Not hard at all  Food  Insecurity: No Food Insecurity  . Worried About Charity fundraiser in the Last Year: Never true  . Ran Out of Food in the Last Year: Never true  Transportation Needs: No Transportation Needs  . Lack of Transportation (Medical): No  . Lack of Transportation (Non-Medical): No  Physical Activity: Sufficiently Active  . Days of Exercise per Week: 2 days  . Minutes of Exercise per Session: 100 min  Stress: No Stress Concern Present  . Feeling of Stress : Only a little  Social Connections: Moderately Isolated  . Frequency of Communication with Friends and Family: More than three times a week  . Frequency of Social Gatherings with Friends and Family: More than three times a week  . Attends Religious Services: More than 4 times per year  . Active Member of Clubs or Organizations: No  . Attends Archivist Meetings: Never  . Marital Status: Widowed  Intimate Partner Violence: Not At Risk  . Fear of Current or Ex-Partner: No  . Emotionally Abused: No  . Physically Abused: No  . Sexually Abused: No    Family History  Problem Relation Age of Onset  . Heart disease Mother   . Diabetes Mother   . Aneurysm Mother   . Heart disease Father   . Heart attack Father   . Heart disease Sister   . Diabetes Sister   . Pneumonia Sister   . Heart disease Brother   . Diabetes Brother   . Diabetes Sister   . Heart disease Sister   . Congestive Heart Failure Sister   . Breast cancer Neg Hx      Current Outpatient Medications:  .  albuterol (VENTOLIN HFA) 108 (90 Base) MCG/ACT inhaler, Inhale 2 puffs into the lungs every 4 (four) hours as needed for wheezing or shortness of breath., Disp: 18 g, Rfl: 1 .  aspirin EC 81 MG tablet, Take 81 mg by mouth daily., Disp: , Rfl:  .  Calcium Carbonate-Vitamin D (CALTRATE 600+D) 600-400 MG-UNIT per tablet, Take 1 tablet by mouth daily. , Disp: , Rfl:  .  docusate sodium (COLACE) 250 MG capsule, Take 250 mg by mouth daily., Disp: , Rfl:  .   famotidine (PEPCID) 20 MG tablet, Take 1 tablet (20 mg total) by mouth 2 (two) times daily as needed for heartburn or indigestion., Disp: 60 tablet, Rfl: 2 .  Hyoscyamine Sulfate 0.375 MG TBCR, Take 1 tablet (0.375 mg total) by mouth 2 (two) times daily as needed., Disp: 100 tablet, Rfl: 2 .  losartan-hydrochlorothiazide (HYZAAR) 100-12.5 MG tablet, Take 0.5 tablets by mouth daily., Disp: 45 tablet, Rfl: 3 .  MULTIPLE VITAMIN PO, Take 1 tablet by mouth daily. , Disp: , Rfl:  .  pantoprazole (PROTONIX) 40 MG tablet, Take 1 tablet (  40 mg total) by mouth daily as needed., Disp: 30 tablet, Rfl: 1 .  psyllium (METAMUCIL) 0.52 g capsule, Take 0.52 g by mouth daily., Disp: , Rfl:  .  triamcinolone cream (KENALOG) 0.1 %, Apply 1 application topically 2 (two) times daily. If needed, Disp: 45 g, Rfl: 1 .  TURMERIC PO, Take 1 tablet by mouth daily. , Disp: , Rfl:   Physical exam: There were no vitals filed for this visit. Limited due to telephone call.  CMP Latest Ref Rng & Units 03/18/2020  Glucose 65 - 99 mg/dL 102(H)  BUN 7 - 25 mg/dL 12  Creatinine 0.60 - 0.93 mg/dL 0.85  Sodium 135 - 146 mmol/L 142  Potassium 3.5 - 5.3 mmol/L 4.8  Chloride 98 - 110 mmol/L 106  CO2 20 - 32 mmol/L 29  Calcium 8.6 - 10.4 mg/dL 9.6  Total Protein 6.1 - 8.1 g/dL 7.1  Total Bilirubin 0.2 - 1.2 mg/dL 0.6  Alkaline Phos 33 - 130 U/L -  AST 10 - 35 U/L 16  ALT 6 - 29 U/L 13   CBC Latest Ref Rng & Units 06/28/2019  WBC 4.0 - 10.5 K/uL 6.2  Hemoglobin 12.0 - 15.0 g/dL 13.2  Hematocrit 36 - 46 % 41.6  Platelets 150 - 400 K/uL 235    No images are attached to the encounter.  CT Chest Wo Contrast  Result Date: 09/10/2020 CLINICAL DATA:  Follow-up lung nodules. EXAM: CT CHEST WITHOUT CONTRAST TECHNIQUE: Multidetector CT imaging of the chest was performed following the standard protocol without IV contrast. COMPARISON:  Chest CTs from 09/07/2019, 08/30/2018 and 08/18/2017. FINDINGS: Cardiovascular: Heart size appears  normal.  No pericardial effusion. Mediastinum/Nodes: Normal appearance of the thyroid gland. The trachea appears patent and is midline. Normal appearance of the esophagus. No axillary, supraclavicular, or mediastinal adenopathy. Lungs/Pleura: Part solid nodule within the medial aspect of left lung apex measures 1 cm with a internal solid component measuring 5 mm, image 24/3. This is stable from previous imaging. Right lower lobe solid nodule measures 4 mm, image 84/3. Unchanged. Left upper lobe lung nodule measures 2 mm, and image 38/3. Also unchanged. No new lung nodules. Upper Abdomen: No acute abnormality. Musculoskeletal: No chest wall mass or suspicious bone lesions identified. IMPRESSION: 1. Stable appearance of part solid nodule within the left lung apex with internal solid component measuring 5 mm. This is remains stable since 08/18/2017. Continued annual surveillance is recommended until 5 years of stability has been established. This recommendation follows the consensus statement: Guidelines for Management of Small Pulmonary Nodules Detected on CT Images: From the Fleischner Society 2017; Radiology 2017; 284:228-243. 2. Additional small lung nodules are also stable, and are favored to represent a benign process. Electronically Signed   By: Kerby Moors M.D.   On: 09/10/2020 09:58   Assessment and plan- Patient is a 74 y.o. female who presents to pulmonary nodule clinic for follow-up of incidental lung nodules.  A telephone visit was conducted to review most recent CT scan results.    CT chest without contrast from today revealed stable appearance of part solid nodule with not apex with internal solid component measuring 5 mm.  This is stable since 08/18/2017.  Continue annual surveillance is recommended until 5 years of stability has been established.  The recommendation follows the consensus statement per Fleischner's guidelines.  Additional small nodules are also stable and are favored to be a  benign process.  Patient would like to forego any additional imaging given nodule  has been stable for about 3 years now.  I have reached out to Dr. Patsey Berthold, pulmonologist to take a look at her imaging and offer her recommendations.  Patient was in agreement.  Calculating malignancy probability of a pulmonary nodule: Risk factors include: 1.  Age. 2.  Cancer history. 3.  Diameter of pulmonary nodule and mm 4.  Location 5.  Smoking history 6.  Spiculation present   Based on risk factors, this patient is Moderate risk for the development of lung cancer given the characteristics of the nodule, location and size. (55mm solid nodule in left upper lobe).  I would recommend a 52-month follow-up with imaging to ensure stability.    During our visit, we discussed pulmonary nodules are a common incidental finding and are often how lung cancer is discovered.  Lung cancer survival is directly related to the stage at diagnosis.  We discussed that nodules can vary in presentation from solitary pulmonary nodules to masses, 2 groundglass opacities and multiple nodules.  Pulmonary nodules in the majority of cases are benign but the probability of these becoming malignant cannot be undermined.  Early identification of malignant nodules could lead to early diagnosis and increased survival.   We discussed the probability of pulmonary nodules becoming malignant increase with age, pack years of tobacco use, size/characteristics of the nodule and location; with upper lobe involvement being most worrisome.   We discussed the goal of our clinic is to thoroughly evaluate each nodule, developed a comprehensive, individualized plan of care utilizing the most advanced technology and significantly reduce the time from detection to treatment.  A dedicated pulmonary nodule clinic has proven to indeed expedite the detection and treatment of lung cancer.   Patient education in fact sheet provided along with most recent CT  scans.  Plan- Discuss with Dr. Patsey Berthold and review images.   Disposition: No additional follow-up needed in our clinic.  Visit Diagnosis 1. Pulmonary nodule     Patient expressed understanding and was in agreement with this plan. She also understands that She can call clinic at any time with any questions, concerns, or complaints.   Greater than 50% was spent in counseling and coordination of care with this patient including but not limited to discussion of the relevant topics above (See A&P) including, but not limited to diagnosis and management of acute and chronic medical conditions.   Thank you for allowing me to participate in the care of this very pleasant patient.    Jacquelin Hawking, NP [facility] Cell - [phone] Pager- [phone] 09/11/2020 1:48 PM

## 2020-09-24 ENCOUNTER — Ambulatory Visit (INDEPENDENT_AMBULATORY_CARE_PROVIDER_SITE_OTHER): Payer: Medicare HMO | Admitting: Family Medicine

## 2020-09-24 ENCOUNTER — Other Ambulatory Visit: Payer: Self-pay

## 2020-09-24 ENCOUNTER — Encounter: Payer: Self-pay | Admitting: Family Medicine

## 2020-09-24 VITALS — BP 132/74 | HR 76 | Temp 98.0°F | Resp 16 | Ht 69.0 in | Wt 208.8 lb

## 2020-09-24 DIAGNOSIS — I10 Essential (primary) hypertension: Secondary | ICD-10-CM | POA: Diagnosis not present

## 2020-09-24 DIAGNOSIS — M25562 Pain in left knee: Secondary | ICD-10-CM | POA: Diagnosis not present

## 2020-09-24 DIAGNOSIS — L309 Dermatitis, unspecified: Secondary | ICD-10-CM

## 2020-09-24 DIAGNOSIS — M25561 Pain in right knee: Secondary | ICD-10-CM

## 2020-09-24 DIAGNOSIS — G8929 Other chronic pain: Secondary | ICD-10-CM | POA: Diagnosis not present

## 2020-09-24 DIAGNOSIS — K219 Gastro-esophageal reflux disease without esophagitis: Secondary | ICD-10-CM

## 2020-09-24 DIAGNOSIS — K580 Irritable bowel syndrome with diarrhea: Secondary | ICD-10-CM

## 2020-09-24 DIAGNOSIS — M159 Polyosteoarthritis, unspecified: Secondary | ICD-10-CM

## 2020-09-24 MED ORDER — TRIAMCINOLONE ACETONIDE 0.5 % EX OINT: 1.0000 "application " | TOPICAL_OINTMENT | Freq: Two times a day (BID) | CUTANEOUS | 0 refills | Status: DC | PRN

## 2020-09-24 NOTE — Progress Notes (Signed)
Name: Ruth Bishop   MRN: [id number]    DOB: January 09, 1946   Date:09/24/2020       Progress Note  Chief Complaint  Patient presents with  . Gastroesophageal Reflux     Subjective:   Ruth Bishop is a 74 y.o. female, presents to clinic for routine f/up  GERD:  Has tried protonix and famotidine   Hypertension:  Currently managed on losartan-HCTZ 100-12.5 Pt reports good med compliance and denies any SE.   Blood pressure today is well controlled. BP Readings from Last 3 Encounters:  09/24/20 132/74  08/20/20 120/70  07/17/20 122/80   Pt denies CP, SOB, exertional sx, LE edema, palpitation, Ha's, visual disturbances, lightheadedness, hypotension, syncope.  Chronic rash to hands - pt requests referral to dermatology  Acute on chronic bilateral knee pain    Current Outpatient Medications:  .  albuterol (VENTOLIN HFA) 108 (90 Base) MCG/ACT inhaler, Inhale 2 puffs into the lungs every 4 (four) hours as needed for wheezing or shortness of breath., Disp: 18 g, Rfl: 1 .  aspirin EC 81 MG tablet, Take 81 mg by mouth daily., Disp: , Rfl:  .  Calcium Carbonate-Vitamin D (CALTRATE 600+D) 600-400 MG-UNIT per tablet, Take 1 tablet by mouth daily. , Disp: , Rfl:  .  docusate sodium (COLACE) 250 MG capsule, Take 250 mg by mouth daily., Disp: , Rfl:  .  famotidine (PEPCID) 20 MG tablet, Take 1 tablet (20 mg total) by mouth 2 (two) times daily as needed for heartburn or indigestion., Disp: 60 tablet, Rfl: 2 .  Hyoscyamine Sulfate 0.375 MG TBCR, Take 1 tablet (0.375 mg total) by mouth 2 (two) times daily as needed., Disp: 100 tablet, Rfl: 2 .  losartan-hydrochlorothiazide (HYZAAR) 100-12.5 MG tablet, Take 0.5 tablets by mouth daily., Disp: 45 tablet, Rfl: 3 .  MULTIPLE VITAMIN PO, Take 1 tablet by mouth daily. , Disp: , Rfl:  .  pantoprazole (PROTONIX) 40 MG tablet, Take 1 tablet (40 mg total) by mouth daily as needed., Disp: 30 tablet, Rfl: 1 .  psyllium (METAMUCIL) 0.52 g capsule, Take  0.52 g by mouth daily., Disp: , Rfl:  .  triamcinolone cream (KENALOG) 0.1 %, Apply 1 application topically 2 (two) times daily. If needed, Disp: 45 g, Rfl: 1 .  TURMERIC PO, Take 1 tablet by mouth daily. , Disp: , Rfl:   Patient Active Problem List   Diagnosis Date Noted  . Lung nodules 03/26/2020  . 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 Elevated C-reactive protein (CRP) 02/14/2018  . Elevated antinuclear antibody (ANA) level 02/14/2018  . First degree AV block 09/17/2017  . Left atrial enlargement 09/17/2017  . Incidental lung nodule, > 53mm and < 83mm 08/04/2017  . Prediabetes 11/11/2016  . Aortic regurgitation 06/17/2016  . Mitral regurgitation 06/17/2016  . Diastolic dysfunction 54/65/0354  . Elevated alkaline phosphatase level 06/13/2015  . 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 Gastroesophageal reflux disease 05/24/2015  . Cardiac murmur 05/24/2015  . Hypertension goal BP (blood pressure) < 150/90 05/24/2015  . 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 ABDOMINAL HYSTERECTOMY    . COLONOSCOPY WITH PROPOFOL N/A 10/01/2015   Procedure: COLONOSCOPY WITH PROPOFOL;  Surgeon: Lollie Sails, MD;  Location: [facility] ENDOSCOPY;  Service: Endoscopy;  Laterality: N/A;  . ESOPHAGOGASTRODUODENOSCOPY (EGD) WITH PROPOFOL N/A 10/01/2015   Procedure: ESOPHAGOGASTRODUODENOSCOPY (EGD) WITH PROPOFOL;  Surgeon: Lollie Sails, MD;  Location: [facility] ENDOSCOPY;  Service:  Endoscopy;  Laterality: N/A;    Family History  Problem Relation Age of Onset  . Heart disease Mother   . Diabetes Mother   . Aneurysm Mother   . Heart disease Father   . Heart attack Father   . Heart disease Sister   . Diabetes Sister   . Pneumonia Sister   . Heart disease Brother   . Diabetes Brother   . Diabetes Sister   . Heart disease Sister   . Congestive Heart Failure Sister   . Breast cancer Neg Hx     Social History    Tobacco Use  . Smoking status: Never Smoker  . Smokeless tobacco: Never Used  . Tobacco comment: smoking cessation materials not required  Vaping Use  . Vaping Use: Never used  Substance Use Topics  . Alcohol use: Not Currently    Alcohol/week: 0.0 standard drinks  . Drug use: No     Allergies  Allergen Reactions  . Nitrofurantoin Monohyd Macro     Health Maintenance  Topic Date Due  . MAMMOGRAM  09/26/2020  . TETANUS/TDAP  05/23/2025  . COLONOSCOPY  09/30/2025  . INFLUENZA VACCINE  Completed  . DEXA SCAN  Completed  . COVID-19 Vaccine  Completed  . Hepatitis C Screening  Completed  . PNA vac Low Risk Adult  Completed    Chart Review Today: I personally reviewed active problem list, medication list, allergies, family history, social history, health maintenance, notes from last encounter, lab results, imaging with the patient/caregiver today.   Review of Systems  10 Systems reviewed and are negative for acute change except as noted in the HPI.  Objective:   Vitals:   09/24/20 0902  BP: 132/74  Pulse: 76  Resp: 16  Temp: 98 F (36.7 C)  TempSrc: Oral  SpO2: 98%  Weight: 208 lb 12.8 oz (94.7 kg)  Height: 5\' 9"  (1.753 m)    Body mass index is 30.83 kg/m.  Physical Exam Vitals and nursing note reviewed.  Constitutional:      General: She is not in acute distress.    Appearance: Normal appearance. She is well-developed. She is not ill-appearing, toxic-appearing or diaphoretic.     Interventions: Face mask in place.  HENT:     Head: Normocephalic and atraumatic.     Right Ear: External ear normal.     Left Ear: External ear normal.  Eyes:     General: Lids are normal. No scleral icterus.       Right eye: No discharge.        Left eye: No discharge.     Conjunctiva/sclera: Conjunctivae normal.  Neck:     Trachea: Phonation normal. No tracheal deviation.  Cardiovascular:     Rate and Rhythm: Normal rate and regular rhythm.     Pulses: Normal pulses.           Radial pulses are 2+ on the right side and 2+ on the left side.       Posterior tibial pulses are 2+ on the right side and 2+ on the left side.     Heart sounds: Normal heart sounds. No murmur heard.  No friction rub. No gallop.   Pulmonary:     Effort: Pulmonary effort is normal. No respiratory distress.     Breath sounds: Normal breath sounds. No stridor. No wheezing, rhonchi or rales.  Chest:     Chest wall: No tenderness.  Abdominal:     General: Bowel sounds are normal. There  is no distension.     Palpations: Abdomen is soft.  Musculoskeletal:     Right knee: Swelling present. Decreased range of motion.     Left knee: Swelling present. Decreased range of motion.     Right lower leg: No edema.     Left lower leg: No edema.  Skin:    General: Skin is warm and dry.     Coloration: Skin is not jaundiced or pale.     Findings: No rash.     Comments: Hypopigmented skin, raised, thickened, peeling to all fingers  Neurological:     Mental Status: She is alert.     Motor: No abnormal muscle tone.     Gait: Gait abnormal (antalgic).  Psychiatric:        Mood and Affect: Mood normal.        Speech: Speech normal.        Behavior: Behavior normal.         Assessment & Plan:   1. Gastroesophageal reflux disease, unspecified whether esophagitis present Stable well controlled sx on protonix and pepcid  2. Hypertension goal BP (blood pressure) < 150/90 Stable, well controlled, BP at goal today, continue losartan HCTZ - COMPLETE METABOLIC PANEL WITH GFR  3. Irritable bowel syndrome with diarrhea Stable with meds  4. Osteoarthritis of multiple joints, unspecified osteoarthritis type Chronic, worsening - Ambulatory referral to Orthopedic Surgery  5. Chronic pain of both knees Chronic, worsening, mobility becoming affected, encouraged pt to f/up with specialists for reevaluation - Ambulatory referral to Orthopedic Surgery  6. Chronic dermatitis of hands Tx with  vaseline and stronger steroid ointment, rash for many years - Ambulatory referral to Dermatology   Return in about 6 months (around 03/25/2021) for Routine follow-up.   Delsa Grana, PA-C 09/24/20 9:14 AM

## 2020-09-25 LAB — COMPLETE METABOLIC PANEL WITH GFR
AG Ratio: 1.4 (calc) (ref 1.0–2.5)
ALT: 13 U/L (ref 6–29)
AST: 15 U/L (ref 10–35)
Albumin: 4 g/dL (ref 3.6–5.1)
Alkaline phosphatase (APISO): 97 U/L (ref 37–153)
BUN: 17 mg/dL (ref 7–25)
CO2: 28 mmol/L (ref 20–32)
Calcium: 9.5 mg/dL (ref 8.6–10.4)
Chloride: 104 mmol/L (ref 98–110)
Creat: 0.93 mg/dL (ref 0.60–0.93)
GFR, Est African American: 71 mL/min/{1.73_m2} (ref >=60)
GFR, Est Non African American: 61 mL/min/{1.73_m2} (ref >=60)
Globulin: 2.9 g/dL (calc) (ref 1.9–3.7)
Glucose, Bld: 95 mg/dL (ref 65–99)
Potassium: 4.3 mmol/L (ref 3.5–5.3)
Sodium: 140 mmol/L (ref 135–146)
Total Bilirubin: 0.6 mg/dL (ref 0.2–1.2)
Total Protein: 6.9 g/dL (ref 6.1–8.1)

## 2020-09-26 ENCOUNTER — Telehealth: Payer: Self-pay

## 2020-09-26 MED ORDER — TRIAMCINOLONE ACETONIDE 0.1 % EX CREA: 1.0000 "application " | TOPICAL_CREAM | Freq: Two times a day (BID) | CUTANEOUS | 1 refills | Status: DC

## 2020-09-26 NOTE — Telephone Encounter (Signed)
Patient given lab results and verbalized understanding.  She also stated that she was waiting to here from someone regarding her referral to Orth[facility] ([facility]) and dermatology. Patient stated that both the triacinolone cream and ointment should go to her mail order pharmacy [facility])

## 2020-09-26 NOTE — Telephone Encounter (Signed)
Copied from [place] [id number]. Topic: General - Call Back - No Documentation >> Sep 26, 2020  1:09 PM Erick Blinks wrote: Reason for CRM: Pt returned office call from "Nelle" tried calling office, please advise  Best contact: [phone]

## 2020-10-01 ENCOUNTER — Other Ambulatory Visit: Payer: Self-pay

## 2020-10-01 ENCOUNTER — Ambulatory Visit
Admission: RE | Admit: 2020-10-01 | Discharge: 2020-10-01 | Disposition: A | Discharge status: Home / Self Care | Payer: Medicare HMO | Source: Ambulatory Visit | Attending: Family Medicine | Admitting: Family Medicine

## 2020-10-01 ENCOUNTER — Telehealth: Payer: Self-pay

## 2020-10-01 DIAGNOSIS — Z1231 Encounter for screening mammogram for malignant neoplasm of breast: Secondary | ICD-10-CM | POA: Insufficient documentation

## 2020-10-01 DIAGNOSIS — Z780 Asymptomatic menopausal state: Secondary | ICD-10-CM | POA: Insufficient documentation

## 2020-10-01 DIAGNOSIS — R634 Abnormal weight loss: Secondary | ICD-10-CM | POA: Diagnosis not present

## 2020-10-01 DIAGNOSIS — R29890 Loss of height: Secondary | ICD-10-CM | POA: Diagnosis not present

## 2020-10-01 DIAGNOSIS — E2839 Other primary ovarian failure: Secondary | ICD-10-CM | POA: Diagnosis not present

## 2020-10-01 DIAGNOSIS — Z8739 Personal history of other diseases of the musculoskeletal system and connective tissue: Secondary | ICD-10-CM | POA: Diagnosis not present

## 2020-10-01 NOTE — Telephone Encounter (Signed)
Copied from [place] [id number]. Topic: Referral - Status >> Oct 01, 2020  2:40 PM Leonides Schanz, [place] wrote: Reason for CRM: Pt stated she has an appt with [facility] next week and she would like to know if the referral was placed. Pt also stated that she had requested a referral for dermatologist for her hair and skin and she would prefer an [place] [employer] provider if possible. Pt requests call back

## 2020-10-01 NOTE — Telephone Encounter (Signed)
Refill on Rx steroids was done previously

## 2020-10-02 NOTE — Telephone Encounter (Signed)
Can you check on this for me?  

## 2020-10-02 NOTE — Telephone Encounter (Signed)
I don't see any recent referrals placed.

## 2020-10-03 ENCOUNTER — Encounter: Payer: Self-pay | Admitting: Family Medicine

## 2020-10-03 NOTE — Telephone Encounter (Signed)
Message sent to Referral office

## 2020-10-10 DIAGNOSIS — M170 Bilateral primary osteoarthritis of knee: Secondary | ICD-10-CM | POA: Diagnosis not present

## 2020-10-16 ENCOUNTER — Ambulatory Visit: Payer: Medicare HMO | Admitting: Family

## 2020-10-16 ENCOUNTER — Other Ambulatory Visit: Payer: Self-pay

## 2020-10-16 ENCOUNTER — Encounter: Payer: Self-pay | Admitting: Family

## 2020-10-16 VITALS — BP 130/70 | HR 66 | Ht 69.5 in | Wt 211.0 lb

## 2020-10-16 DIAGNOSIS — R911 Solitary pulmonary nodule: Secondary | ICD-10-CM | POA: Diagnosis not present

## 2020-10-16 DIAGNOSIS — K219 Gastro-esophageal reflux disease without esophagitis: Secondary | ICD-10-CM | POA: Diagnosis not present

## 2020-10-16 DIAGNOSIS — I10 Essential (primary) hypertension: Secondary | ICD-10-CM

## 2020-10-16 NOTE — Progress Notes (Signed)
Office Visit    Patient Name: Ruth Bishop Date of Encounter: 10/16/2020  Primary Care Provider:  Delsa Grana, PA-C Primary Cardiologist:  No primary care provider on file. Electrophysiologist:  None   Chief Complaint    Ruth Bishop is a 74 y.o. female with a hx of HTN, fatigue, chest pain, costochondritis, GERD, IBS with diarrhea, pulmonary nodule presents today for follow-up of HTN  Past Medical History    Past Medical History:  Diagnosis Date  . Abnormal chest x-ray 10/18/2018  . Arthritis   . Chronic pain 05/24/2015  . Diastolic dysfunction 5/36/1443  . GERD (gastroesophageal reflux disease)   . Heart murmur   . Hormone replacement therapy 05/26/2016  . Hypertension   . Incidental lung nodule, > 43mm and < 10mm 08/04/2017   4 mm ground glass nodule LLL incidentally noted on CT scan Nov 2016  . Osteoarthritis of both knees 06/13/2015   [facility]   . Post menopausal syndrome 05/24/2015  . Skin-picking disorder 05/24/2015   when nervous/stressed chews on fingers and peels down skin, started with cuticles but now hypopigmentation of fingers down to palm in some areas with lifetime of skin picking.    Past Surgical History:  Procedure Laterality Date  . ABDOMINAL HYSTERECTOMY    . COLONOSCOPY WITH PROPOFOL N/A 10/01/2015   Procedure: COLONOSCOPY WITH PROPOFOL;  Surgeon: Lollie Sails, MD;  Location: [facility] ENDOSCOPY;  Service: Endoscopy;  Laterality: N/A;  . ESOPHAGOGASTRODUODENOSCOPY (EGD) WITH PROPOFOL N/A 10/01/2015   Procedure: ESOPHAGOGASTRODUODENOSCOPY (EGD) WITH PROPOFOL;  Surgeon: Lollie Sails, MD;  Location: [facility] ENDOSCOPY;  Service: Endoscopy;  Laterality: N/A;    Allergies  Allergies  Allergen Reactions  . Nitrofurantoin Monohyd Macro     History of Present Illness    Ruth Bishop is a 74 y.o. female with a hx of HTN, fatigue, chest pain, costochondritis, GERD, IBS with diarrhea, pulmonary nodule last seen 10/10/2019 by Dr.  Rockey Situ.  Initially evaluated by Dr. Rockey Situ due to symptoms of chest pain diagnosed with costochondritis.  She follows with rheumatology at [facility]  Previous echocardiogram November 2018 with LVEF 60 to 15%, grade 1 diastolic dysfunction, mild AI.  She had a previous CT scan in 2019 with no coronary calcifications nor aortic atherosclerosis.  She had CT chest 09/10/2020 for follow-up of pulmonary nodule with stable appearance of support solid nodule within the apex with internal solid component measuring 5 mm.  Stable since 08/18/2017.  Recommended for annual surveillance until 5 years of stability established.  Very pleasant lady who presents today for follow-up. She enjoys playing tennis twice per week to stay active. She endorses eating a low-sodium, heart healthy diet. Reports no chest pain, pressure, tightness. Reports no shortness of breath at rest no dyspnea exertion. She has no concerns related to her heart.  EKGs/Labs/Other Studies Reviewed:   The following studies were reviewed today:   EKG:  EKG is  ordered today.  The ekg ordered today demonstrates NSR 66 bpm with no acute St/T wave changes.   Recent Labs: 09/24/2020: ALT 13; BUN 17; Creat 0.93; Potassium 4.3; Sodium 140  Recent Lipid Panel    Component Value Date/Time   CHOL 167 03/18/2020 0946   CHOL 148 06/07/2015 1035   TRIG 113 03/18/2020 0946   HDL 58 03/18/2020 0946   HDL 51 06/07/2015 1035   CHOLHDL 2.9 03/18/2020 0946   VLDL 27 08/04/2017 1507   LDLCALC 88 03/18/2020 0946    Home Medications   Current Meds  Medication Sig  . albuterol (VENTOLIN HFA) 108 (90 Base) MCG/ACT inhaler Inhale 2 puffs into the lungs every 4 (four) hours as needed for wheezing or shortness of breath.  Marland Kitchen aspirin EC 81 MG tablet Take 81 mg by mouth daily.  . Calcium Carbonate-Vitamin D (CALTRATE 600+D) 600-400 MG-UNIT per tablet Take 1 tablet by mouth daily.   Marland Kitchen docusate sodium (COLACE) 250 MG capsule Take 250 mg by mouth daily.  .  famotidine (PEPCID) 20 MG tablet Take 1 tablet (20 mg total) by mouth 2 (two) times daily as needed for heartburn or indigestion.  Marland Kitchen Hyoscyamine Sulfate 0.375 MG TBCR Take 1 tablet (0.375 mg total) by mouth 2 (two) times daily as needed.  Marland Kitchen losartan-hydrochlorothiazide (HYZAAR) 100-12.5 MG tablet Take 0.5 tablets by mouth daily.  . MULTIPLE VITAMIN PO Take 1 tablet by mouth daily.   . pantoprazole (PROTONIX) 40 MG tablet Take 1 tablet (40 mg total) by mouth daily as needed.  . psyllium (METAMUCIL) 0.52 g capsule Take 0.52 g by mouth daily.  Marland Kitchen triamcinolone cream (KENALOG) 0.1 % Apply 1 application topically 2 (two) times daily. If needed  . TURMERIC PO Take 1 tablet by mouth daily.      Review of Systems  All other systems reviewed and are otherwise negative except as noted above.  Physical Exam    VS:  BP (!) 160/80 (BP Location: Left Arm, Patient Position: Sitting, Cuff Size: Large)   Pulse 66   Ht 5' 9.5" (1.765 m)   Wt 211 lb (95.7 kg)   BMI 30.71 kg/m  , BMI Body mass index is 30.71 kg/m.  Wt Readings from Last 3 Encounters:  10/16/20 211 lb (95.7 kg)  09/24/20 208 lb 12.8 oz (94.7 kg)  08/20/20 209 lb 12.8 oz (95.2 kg)    GEN: Well nourished, overweight, well developed, in no acute distress. HEENT: normal. Neck: Supple, no JVD, carotid bruits, or masses. Cardiac: RRR, no murmurs, rubs, or gallops. No clubbing, cyanosis, edema.  Radials/PT 2+ and equal bilaterally.  Respiratory:  Respirations regular and unlabored, clear to auscultation bilaterally. GI: Soft, nontender, nondistended. MS: No deformity or atrophy. Skin: Warm and dry, no rash. Neuro:  Strength and sensation are intact. Psych: Normal affect.  Assessment & Plan    1. HTN-initial BP 160/80 that improved to 130/70 without intervention. Continue current antihypertensive regimen. She will report BP consistently greater than 130/80.  2. Chest pain with history of costochondritis -reports no recurrent chest pain,  pressure, tightness. EKG without acute ST/T wave changes. No negation for ischemic evaluation.   3. Pulmonary nodule-continue to follow with pulmonology. She completed her annual screening for October 2020 is recommended for repeat CT in 1 year.  4. GERD- Continue to follow with primary care provider.   Disposition: Follow up in 1 year(s) with Dr. Rockey Situ or APP  Signed, Loel Dubonnet, NP 10/16/2020, 1:41 PM [place]

## 2020-10-16 NOTE — Patient Instructions (Addendum)
Medication Instructions:  No medication changes today.  *If you need a refill on your cardiac medications before your next appointment, please call your pharmacy*   Lab Work: None ordered today.   Testing/Procedures: Your EKG today shows normal sinus rhythm which is a great result!  Follow-Up: At [facility], you and your health needs are our priority.  As part of our continuing mission to provide you with exceptional heart care, we have created designated Provider Care Teams.  These Care Teams include your primary Cardiologist (physician) and Advanced Practice Providers (APPs -  Physician Assistants and Nurse Practitioners) who all work together to provide you with the care you need, when you need it.  We recommend signing up for the patient portal called "MyChart".  Sign up information is provided on this After Visit Summary.  MyChart is used to connect with patients for Virtual Visits (Telemedicine).  Patients are able to view lab/test results, encounter notes, upcoming appointments, etc.  Non-urgent messages can be sent to your provider as well.   To learn more about what you can do with MyChart, go to [email or web address].    Your next appointment:   1 year(s)  The format for your next appointment:   In Person  Provider:   You may see Ida Rogue, MD or one of the following Advanced Practice Providers on your designated Care Team:    Murray Hodgkins, NP  Christell Faith, PA-C  Marrianne Mood, PA-C  Laurann Montana, NP  Cadence Kathlen Mody, [place]  Other Instructions:  Our goal is for your blood pressure to be less than 130/80 on average.

## 2020-10-17 IMAGING — CR CHEST - 2 VIEW
1 series · 2 of 2 positions shown · non-contrast
Comparison: PA and lateral chest 10/13/2018.  CT chest 08/30/2018.

CLINICAL DATA: Sensation of heart fluttering this morning. Mid
chest pressure.

EXAM:
CHEST - 2 VIEW

[Series 1: dg chest 2 view · 0.14mm/px · 2 of 2 slices shown]
[im 1/2]
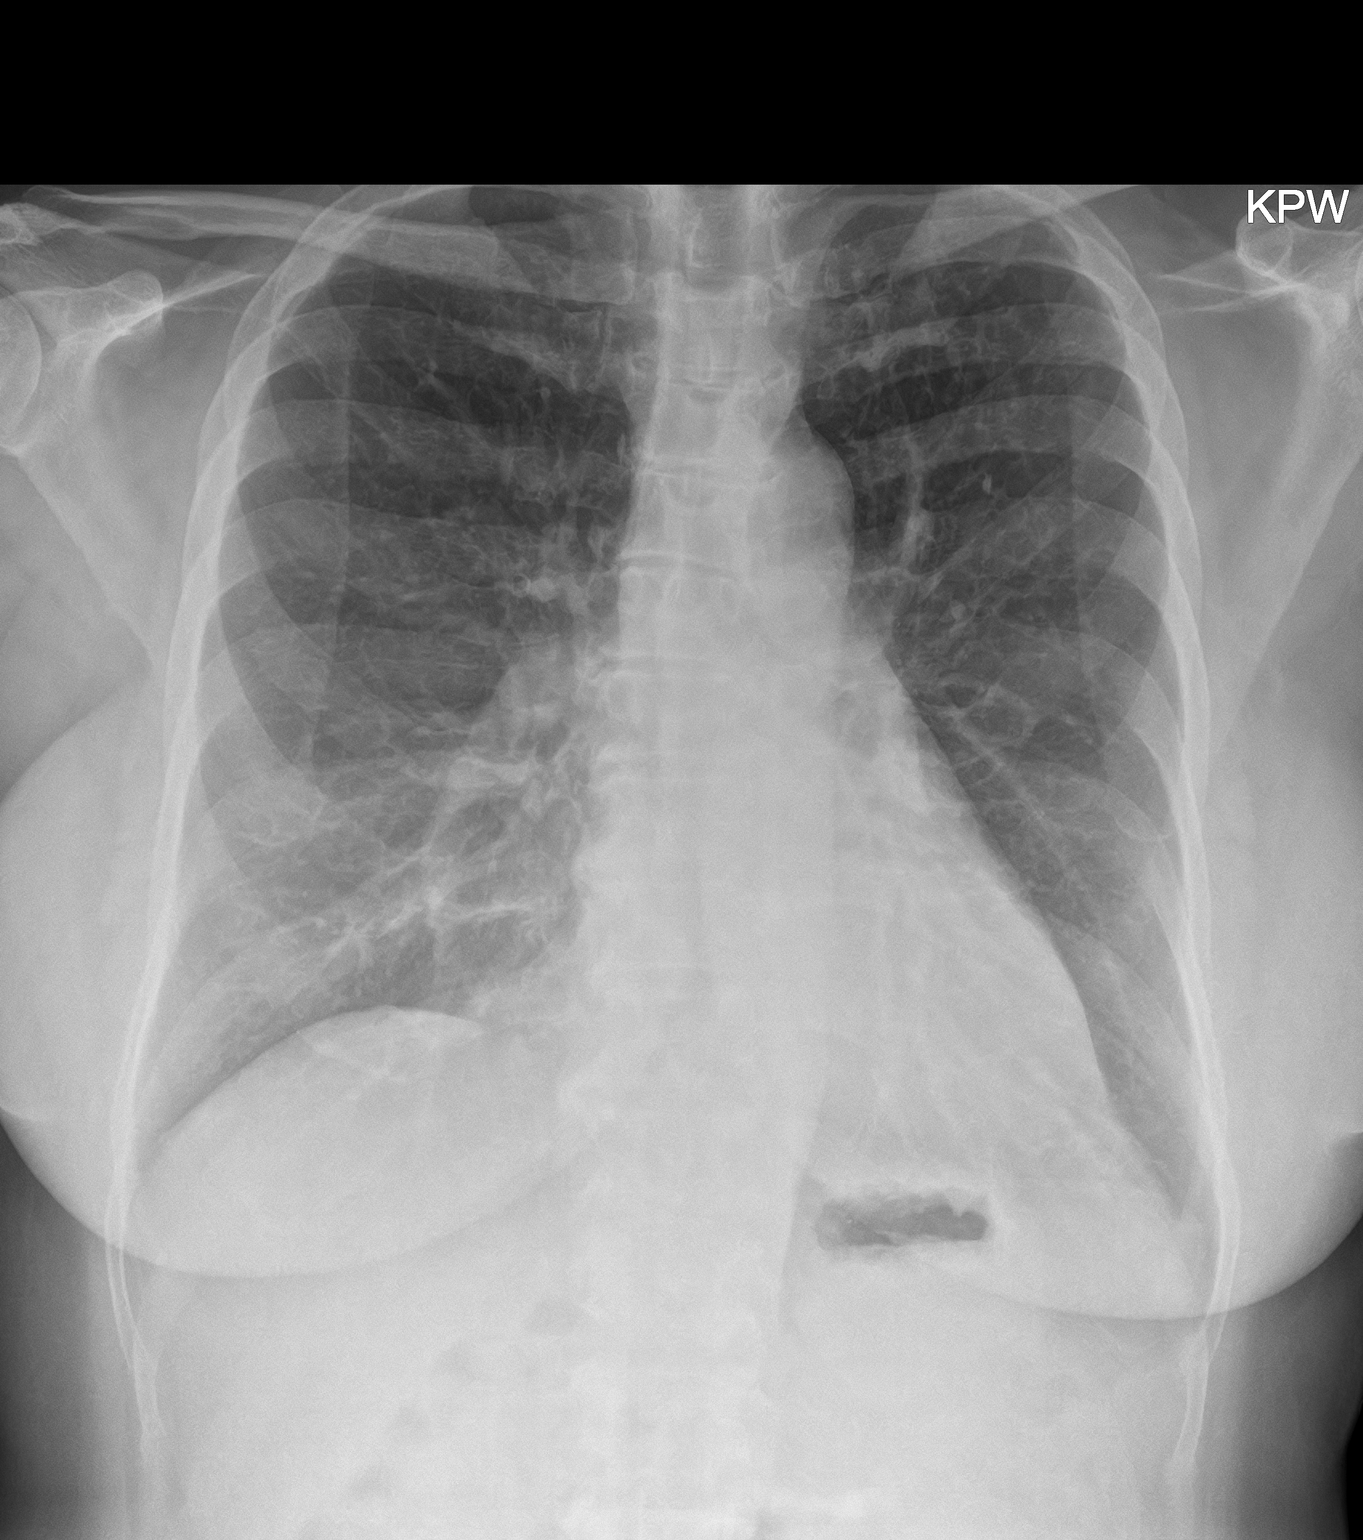
[im 2/2]
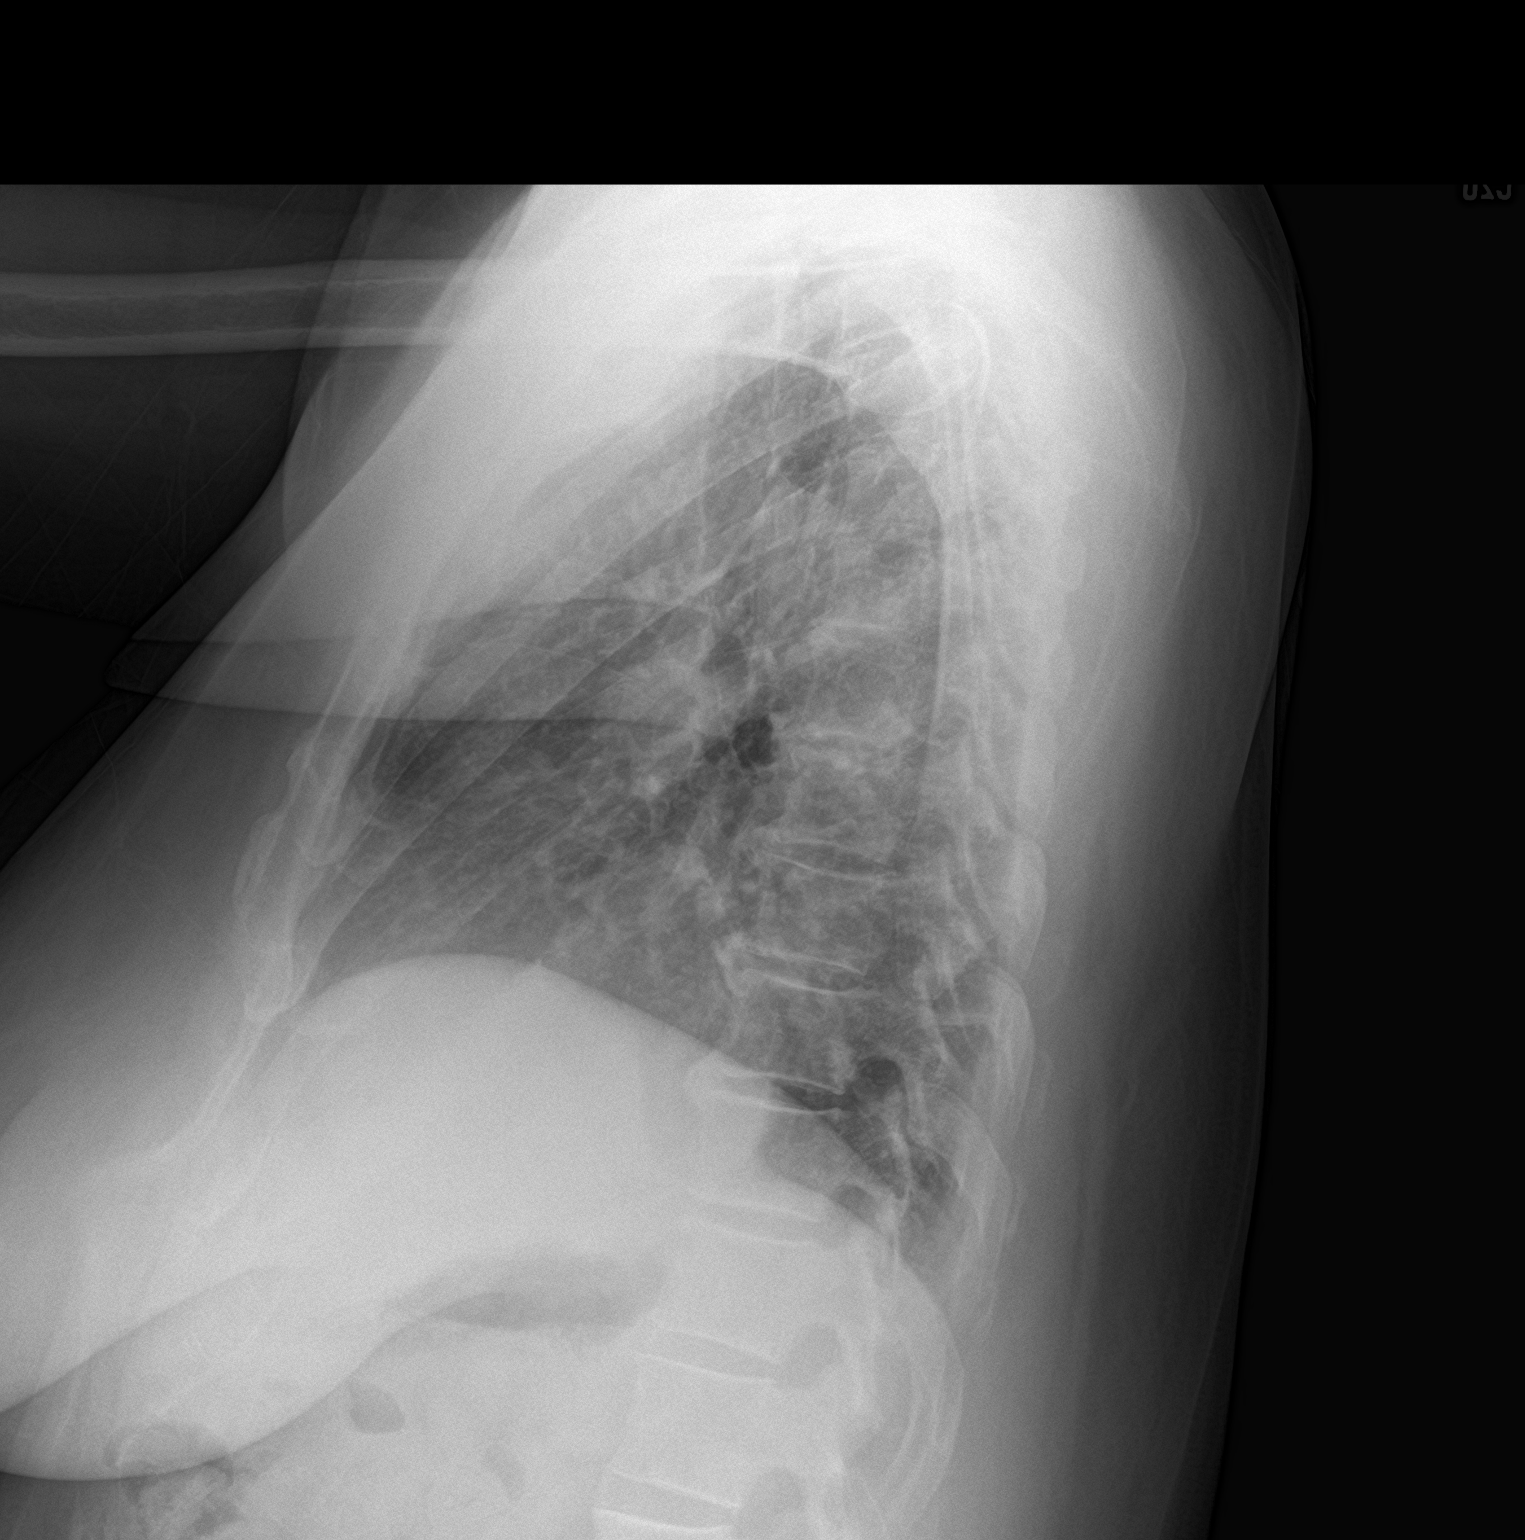

[2 of 2 positions shown; findings below may reference images not displayed]

FINDINGS: Lungs clear. Heart size normal. No pneumothorax or pleural fluid. No
acute or focal bony abnormality.
IMPRESSION: Negative chest.

## 2020-12-10 ENCOUNTER — Other Ambulatory Visit: Payer: Self-pay | Admitting: Emergency Medicine

## 2020-12-12 ENCOUNTER — Other Ambulatory Visit: Payer: Self-pay | Admitting: Emergency Medicine

## 2020-12-16 MED ORDER — TRIAMCINOLONE ACETONIDE 0.1 % EX CREA: 1.0000 "application " | TOPICAL_CREAM | Freq: Two times a day (BID) | CUTANEOUS | 1 refills | Status: DC

## 2020-12-20 ENCOUNTER — Other Ambulatory Visit: Payer: Self-pay | Admitting: Family Medicine

## 2020-12-20 MED ORDER — TRIAMCINOLONE ACETONIDE 0.1 % EX CREA: 1.0000 "application " | TOPICAL_CREAM | Freq: Two times a day (BID) | CUTANEOUS | 1 refills | Status: DC

## 2020-12-20 NOTE — Telephone Encounter (Signed)
Medication: triamcinolone ointment (KENALOG) 0.5 % [[id number]]  DISCONTINUED  Has the patient contacted their pharmacy? YES  (Agent: If no, request that the patient contact the pharmacy for the refill.) (Agent: If yes, when and what did the pharmacy advise?)  Preferred Pharmacy (with phone number or street name): [facility], [address] [place] [address] Phone: [phone] Fax: [phone] Hours: Not open 24 hours    Agent: Please be advised that RX refills may take up to 3 business days. We ask that you follow-up with your pharmacy.

## 2021-01-05 ENCOUNTER — Other Ambulatory Visit: Payer: Self-pay | Admitting: Family Medicine

## 2021-01-05 DIAGNOSIS — I10 Essential (primary) hypertension: Secondary | ICD-10-CM

## 2021-03-04 ENCOUNTER — Ambulatory Visit: Payer: Self-pay | Admitting: Physician Assistant

## 2021-03-05 ENCOUNTER — Encounter: Payer: Medicare PPO | Admitting: Family Medicine

## 2021-04-03 ENCOUNTER — Other Ambulatory Visit: Payer: Self-pay

## 2021-04-03 ENCOUNTER — Ambulatory Visit (INDEPENDENT_AMBULATORY_CARE_PROVIDER_SITE_OTHER): Payer: Medicare PPO | Admitting: Family Medicine

## 2021-04-03 ENCOUNTER — Encounter: Payer: Self-pay | Admitting: Family Medicine

## 2021-04-03 VITALS — BP 116/62 | HR 72 | Temp 98.1°F | Resp 16 | Ht 70.0 in | Wt 217.1 lb

## 2021-04-03 DIAGNOSIS — J683 Other acute and subacute respiratory conditions due to chemicals, gases, fumes and vapors: Secondary | ICD-10-CM

## 2021-04-03 DIAGNOSIS — K219 Gastro-esophageal reflux disease without esophagitis: Secondary | ICD-10-CM

## 2021-04-03 DIAGNOSIS — M159 Polyosteoarthritis, unspecified: Secondary | ICD-10-CM

## 2021-04-03 DIAGNOSIS — Z0000 Encounter for general adult medical examination without abnormal findings: Secondary | ICD-10-CM

## 2021-04-03 DIAGNOSIS — R7303 Prediabetes: Secondary | ICD-10-CM

## 2021-04-03 DIAGNOSIS — I10 Essential (primary) hypertension: Secondary | ICD-10-CM | POA: Diagnosis not present

## 2021-04-03 DIAGNOSIS — K580 Irritable bowel syndrome with diarrhea: Secondary | ICD-10-CM

## 2021-04-03 DIAGNOSIS — Z1231 Encounter for screening mammogram for malignant neoplasm of breast: Secondary | ICD-10-CM

## 2021-04-03 DIAGNOSIS — E781 Pure hyperglyceridemia: Secondary | ICD-10-CM

## 2021-04-03 MED ORDER — LOSARTAN POTASSIUM-HCTZ 100-12.5 MG PO TABS: 0.5000 | ORAL_TABLET | Freq: Every day | ORAL | 3 refills | Status: DC

## 2021-04-03 NOTE — Progress Notes (Signed)
Patient: Ruth Bishop, Female    DOB: December 13, 1945, 75 y.o.   MRN: [id number] Delsa Grana, PA-C Visit Date: 04/03/2021  Today's Provider: Delsa Grana, PA-C   Chief Complaint  Patient presents with  . Annual Exam   Subjective:   Annual physical exam:  Ruth Bishop is a 75 y.o. female who presents today for complete physical exam:  Exercise/Activity:  2x a week, 60 min, doing breast CA walk Saturday, tennis  Diet/nutrition:  Trying to eat healthy Sleep: no concerns    USPSTF grade A and B recommendations - reviewed and addressed today  Depression:  Phq 9 completed today by patient, was reviewed by me with patient in the room PHQ score is neg, pt feels good PHQ 2/9 Scores 04/03/2021 09/24/2020 08/20/2020 07/17/2020  PHQ - 2 Score 0 0 2 0  PHQ- 9 Score 0 - 4 0   Depression screen [facility] 2/9 04/03/2021 09/24/2020 08/20/2020 07/17/2020 03/20/2020  Decreased Interest 0 0 1 0 0  Down, Depressed, Hopeless 0 0 1 0 0  PHQ - 2 Score 0 0 2 0 0  Altered sleeping 0 - 1 0 -  Tired, decreased energy 0 - 1 0 -  Change in appetite 0 - 0 0 -  Feeling bad or failure about yourself  0 - 0 0 -  Trouble concentrating 0 - 0 0 -  Moving slowly or fidgety/restless 0 - 0 0 -  Suicidal thoughts 0 - 0 0 -  PHQ-9 Score 0 - 4 0 -  Difficult doing work/chores Not difficult at all Not difficult at all Not difficult at all Not difficult at all -  Some recent data might be hidden    Alcohol screening: [place] Office Visit from 09/24/2020 in [facility]  AUDIT-C Score 0      Immunizations and Health Maintenance: Health Maintenance  Topic Date Due  . INFLUENZA VACCINE  07/07/2021  . MAMMOGRAM  10/01/2021  . TETANUS/TDAP  05/23/2025  . COLONOSCOPY (Pts 45-21yr Insurance coverage will need to be confirmed)  09/30/2025  . DEXA SCAN  Completed  . COVID-19 Vaccine  Completed  . Hepatitis C Screening  Completed  . PNA vac Low Risk Adult  Completed  . HPV VACCINES  Aged Out      Hep C Screening: done  STD testing and prevention (HIV/chl/gon/syphilis):  see above, no additional testing desired by pt today - none needed  Intimate partner violence:denies  Sexual History/Pain during Intercourse: Widowed  Menstrual History/LMP/Abnormal Bleeding: denies No LMP recorded. Patient has had a hysterectomy.  Incontinence Symptoms: none  Breast cancer:  Due in Oct Last Mammogram: *see HM list above BRCA gene screening: none  Cervical cancer screening: n/a Pt denies family hx of cancers - breast, ovarian, uterine, colon:     Osteoporosis:   Discussion on osteoporosis per age, including high calcium and vitamin D supplementation, weight bearing exercises Pt is supplementing with daily calcium/Vit D. Done in 2021 - normal Bone scan/dexa  Skin cancer:  Hx of skin CA -  NO Discussed atypical lesions   Colorectal cancer:   Colonoscopy is UTD   Discussed concerning signs and sx of CRC, pt denies red flag sx  Lung cancer:   Low Dose CT Chest recommended if Age 73-80years, 30 pack-year currently smoking OR have quit w/in 15years. Patient does not qualify.    Social History   Tobacco Use  . Smoking status: Never Smoker  . Smokeless tobacco: Never Used  .  Tobacco comment: smoking cessation materials not required  Vaping Use  . Vaping Use: Never used  Substance Use Topics  . Alcohol use: Not Currently    Alcohol/week: 0.0 standard drinks  . Drug use: No     Flowsheet Row Office Visit from 09/24/[facility] in [facility]  AUDIT-C Score 0      Family History  Problem Relation Age of Onset  . Heart disease Mother   . Diabetes Mother   . Aneurysm Mother   . Heart disease Father   . Heart attack Father   . Heart disease Sister   . Diabetes Sister   . Pneumonia Sister   . Heart disease Brother   . Diabetes Brother   . Diabetes Sister   . Heart disease Sister   . Congestive Heart Failure Sister   . Breast cancer Neg Hx      Blood  pressure/Hypertension: BP Readings from Last 3 Encounters:  04/03/21 116/62  10/16/20 130/70  09/24/20 132/74    Weight/Obesity: Wt Readings from Last 3 Encounters:  04/03/21 217 lb 1.6 oz (98.5 kg)  10/16/20 211 lb (95.7 kg)  09/24/20 208 lb 12.8 oz (94.7 kg)   BMI Readings from Last 3 Encounters:  04/03/21 31.15 kg/m  10/16/20 30.71 kg/m  09/24/20 30.83 kg/m     Lipids:  Lab Results  Component Value Date   CHOL 167 03/18/2020   CHOL 158 08/17/2019   CHOL 134 10/13/2018   Lab Results  Component Value Date   HDL 58 03/18/2020   HDL 53 08/17/2019   HDL 39 (L) 10/13/2018   Lab Results  Component Value Date   LDLCALC 88 03/18/2020   LDLCALC 78 08/17/2019   LDLCALC 64 10/13/2018   Lab Results  Component Value Date   TRIG 113 03/18/2020   TRIG 175 (H) 08/17/2019   TRIG 261 (H) 10/13/2018   Lab Results  Component Value Date   CHOLHDL 2.9 03/18/2020   CHOLHDL 3.0 08/17/2019   CHOLHDL 3.4 10/13/2018   No results found for: LDLDIRECT Based on the results of lipid panel his/her cardiovascular risk factor ( using [place] )  in the next 10 years is: The 10-year ASCVD risk score Mikey Bussing DC Brooke Bonito., et al., 2013) is: 9.7%   Values used to calculate the score:     Age: 59 years     Sex: Female     Is Non-Hispanic African American: Yes     Diabetic: No     Tobacco smoker: No     Systolic Blood Pressure: 197 mmHg     Is BP treated: Yes     HDL Cholesterol: 58 mg/dL     Total Cholesterol: 167 mg/dL Glucose:  Glucose, Bld  Date Value Ref Range Status  09/24/2020 95 65 - 99 mg/dL Final    Comment:    .            Fasting reference interval .   03/18/2020 102 (H) 65 - 99 mg/dL Final    Comment:    .            Fasting reference interval . For someone without known diabetes, a glucose value between 100 and 125 mg/dL is consistent with prediabetes and should be confirmed with a follow-up test. .   08/17/2019 98 65 - 99 mg/dL Final    Comment:    .             Fasting reference interval .  Social History      She        Social History   Socioeconomic History  . Marital status: Widowed    Spouse name: Not on file  . Number of children: 1  . Years of education: Not on file  . Highest education level: Master's degree (e.g., MA, MS, MEng, MEd, MSW, MBA)  Occupational History  . Occupation: Retired  Tobacco Use  . Smoking status: Never Smoker  . Smokeless tobacco: Never Used  . Tobacco comment: smoking cessation materials not required  Vaping Use  . Vaping Use: Never used  Substance and Sexual Activity  . Alcohol use: Not Currently    Alcohol/week: 0.0 standard drinks  . Drug use: No  . Sexual activity: Not Currently  Other Topics Concern  . Not on file  Social History Narrative   Pt lives alone   Social Determinants of Health   Financial Resource Strain: Low Risk   . Difficulty of Paying Living Expenses: Not hard at all  Food Insecurity: No Food Insecurity  . Worried About Charity fundraiser in the Last Year: Never true  . Ran Out of Food in the Last Year: Never true  Transportation Needs: No Transportation Needs  . Lack of Transportation (Medical): No  . Lack of Transportation (Non-Medical): No  Physical Activity: Insufficiently Active  . Days of Exercise per Week: 2 days  . Minutes of Exercise per Session: 60 min  Stress: No Stress Concern Present  . Feeling of Stress : Not at all  Social Connections: Moderately Integrated  . Frequency of Communication with Friends and Family: Three times a week  . Frequency of Social Gatherings with Friends and Family: Twice a week  . Attends Religious Services: 1 to 4 times per year  . Active Member of Clubs or Organizations: Yes  . Attends Archivist Meetings: 1 to 4 times per year  . Marital Status: Widowed    Family History        Family History  Problem Relation Age of Onset  . Heart disease Mother   . Diabetes Mother   . Aneurysm Mother   . Heart  disease Father   . Heart attack Father   . Heart disease Sister   . Diabetes Sister   . Pneumonia Sister   . Heart disease Brother   . Diabetes Brother   . Diabetes Sister   . Heart disease Sister   . Congestive Heart Failure Sister   . Breast cancer Neg Hx     Patient Active Problem List   Diagnosis Date Noted  . Lung nodules 03/26/2020  . 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 Elevated C-reactive protein (CRP) 02/14/2018  . Elevated antinuclear antibody (ANA) level 02/14/2018  . First degree AV block 09/17/2017  . Left atrial enlargement 09/17/2017  . Aortic regurgitation 06/17/2016  . Mitral regurgitation 06/17/2016  . Diastolic dysfunction 42/09/3127  . 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 Gastroesophageal reflux disease 05/24/2015  . Hypertension goal BP (blood pressure) < 150/90 05/24/2015  . 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 ABDOMINAL HYSTERECTOMY    . COLONOSCOPY WITH PROPOFOL N/A 10/01/2015   Procedure: COLONOSCOPY WITH PROPOFOL;  Surgeon: Lollie Sails, MD;  Location[facility] ENDOSCOPY;  Service: Endoscopy;  Laterality: N/A;  . ESOPHAGOGASTRODUODENOSCOPY (EGD) WITH PROPOFOL N/A 10/01/2015   Procedure: ESOPHAGOGASTRODUODENOSCOPY (EGD) WITH PROPOFOL;  Surgeon: Lollie Sails, MD;  Location: [facility] ENDOSCOPY;  Service: Endoscopy;  Laterality: N/A;     Current Outpatient Medications:  .  aspirin EC 81 MG tablet, Take 81 mg by mouth daily., Disp: , Rfl:  .  Calcium Carbonate-Vitamin D 600-400 MG-UNIT tablet, Take 1 tablet by mouth daily. , Disp: , Rfl:  .  docusate sodium (COLACE) 250 MG capsule, Take 250 mg by mouth daily., Disp: , Rfl:  .  Hyoscyamine Sulfate 0.375 MG TBCR, Take 1 tablet (0.375 mg total) by mouth 2 (two) times daily as needed., Disp: 100 tablet, Rfl: 2 .  MULTIPLE VITAMIN PO, Take 1 tablet by mouth daily. , Disp: , Rfl:  .  psyllium (METAMUCIL)  0.52 g capsule, Take 0.52 g by mouth daily., Disp: , Rfl:  .  triamcinolone (KENALOG) 0.1 %, Apply 1 application topically 2 (two) times daily. If needed, Disp: 45 g, Rfl: 1 .  TURMERIC PO, Take 1 tablet by mouth daily. , Disp: , Rfl:  .  albuterol (VENTOLIN HFA) 108 (90 Base) MCG/ACT inhaler, Inhale 2 puffs into the lungs every 4 (four) hours as needed for wheezing or shortness of breath. (Patient not taking: Reported on 04/03/2021), Disp: 18 g, Rfl: 1 .  famotidine (PEPCID) 20 MG tablet, Take 1 tablet (20 mg total) by mouth 2 (two) times daily as needed for heartburn or indigestion. (Patient not taking: Reported on 04/03/2021), Disp: 60 tablet, Rfl: 2 .  losartan-hydrochlorothiazide (HYZAAR) 100-12.5 MG tablet, Take 0.5 tablets by mouth daily., Disp: 45 tablet, Rfl: 3 .  pantoprazole (PROTONIX) 40 MG tablet, Take 1 tablet (40 mg total) by mouth daily as needed. (Patient not taking: Reported on 04/03/2021), Disp: 30 tablet, Rfl: 1  Allergies  Allergen Reactions  . Nitrofurantoin Monohyd Macro     Patient Care Team: Delsa Grana, PA-C as PCP - General (Family Medicine) Minna Merritts, MD as PCP - Cardiology (Cardiology) Lahoma Rocker, MD as Consulting Physician (Rheumatology) Minna Merritts, MD as Consulting Physician (Cardiology)  Review of Systems  Constitutional: Negative.  Negative for activity change, appetite change, fatigue and unexpected weight change.  HENT: Positive for congestion.   Eyes: Negative.   Respiratory: Positive for cough. Negative for chest tightness, shortness of breath and wheezing.   Cardiovascular: Negative.  Negative for chest pain, palpitations and leg swelling.  Gastrointestinal: Negative.  Negative for abdominal pain and blood in stool.  Endocrine: Negative.   Genitourinary: Negative.   Musculoskeletal: Positive for arthralgias (joints stiff). Negative for gait problem, joint swelling and myalgias.  Skin: Negative.  Negative for pallor and rash.   Allergic/Immunologic: Negative.   Neurological: Negative.  Negative for syncope and weakness.  Hematological: Negative.   Psychiatric/Behavioral: Negative.  Negative for dysphoric mood, self-injury and suicidal ideas. The patient is not nervous/anxious.   All other systems reviewed and are negative.    I personally reviewed active problem list, medication list, allergies, family history, social history, health maintenance, notes from last encounter, lab results, imaging with the patient/caregiver today.        Objective:   Vitals:  Vitals:   04/03/21 1116  BP: 116/62  Pulse: 72  Resp: 16  Temp: 98.1 F (36.7 C)  SpO2: 99%  Weight: 217 lb 1.6 oz (98.5 kg)  Height: _0  (1.778 m)    Body mass index is 31.15 kg/m.  Physical Exam Vitals and nursing note reviewed.  Constitutional:      General: She is not in acute distress.    Appearance: Normal appearance. She is  well-developed. She is not ill-appearing, toxic-appearing or diaphoretic.  HENT:     Head: Normocephalic and atraumatic.     Right Ear: Tympanic membrane, ear canal and external ear normal. There is no impacted cerumen.     Left Ear: Tympanic membrane, ear canal and external ear normal. There is no impacted cerumen.     Nose: Congestion and rhinorrhea present.     Mouth/Throat:     Mouth: Mucous membranes are moist.     Pharynx: Oropharynx is clear. Uvula midline. No oropharyngeal exudate or posterior oropharyngeal erythema.  Eyes:     General: Lids are normal. No scleral icterus.       Right eye: No discharge.        Left eye: No discharge.     Conjunctiva/sclera: Conjunctivae normal.  Neck:     Trachea: Phonation normal. No tracheal deviation.  Cardiovascular:     Rate and Rhythm: Normal rate and regular rhythm.     Pulses: Normal pulses.          Radial pulses are 2+ on the right side and 2+ on the left side.       Posterior tibial pulses are 2+ on the right side and 2+ on the left side.     Heart  sounds: Normal heart sounds. No murmur heard. No friction rub. No gallop.   Pulmonary:     Effort: Pulmonary effort is normal. No respiratory distress.     Breath sounds: Normal breath sounds. No stridor. No wheezing, rhonchi or rales.  Chest:     Chest wall: No tenderness.  Abdominal:     General: Bowel sounds are normal. There is no distension.     Palpations: Abdomen is soft.     Tenderness: There is no abdominal tenderness. There is no guarding or rebound.  Musculoskeletal:     Cervical back: Normal range of motion and neck supple.     Right lower leg: No edema.     Left lower leg: No edema.  Lymphadenopathy:     Cervical: No cervical adenopathy.  Skin:    General: Skin is warm and dry.     Capillary Refill: Capillary refill takes less than 2 seconds.     Coloration: Skin is not pale.     Findings: No rash.  Neurological:     Mental Status: She is alert and oriented to person, place, and time. Mental status is at baseline.     Motor: No abnormal muscle tone.     Gait: Gait normal.  Psychiatric:        Mood and Affect: Mood normal.        Speech: Speech normal.        Behavior: Behavior normal.       Fall Risk: Fall Risk  04/03/2021 09/24/2020 08/20/2020 07/17/2020 03/20/2020  Falls in the past year? 0 0 0 0 0  Number falls in past yr: 0 0 0 0 -  Injury with Fall? 0 0 0 0 -  Risk for fall due to : - - No Fall Risks - -  Risk for fall due to: Comment - - - - -  Follow up - Falls evaluation completed Falls prevention discussed - -    Functional Status Survey: Is the patient deaf or have difficulty hearing?: No Does the patient have difficulty seeing, even when wearing glasses/contacts?: No Does the patient have difficulty concentrating, remembering, or making decisions?: No Does the patient have difficulty walking or climbing stairs?: No  Does the patient have difficulty dressing or bathing?: No Does the patient have difficulty doing errands alone such as visiting a  doctor's office or shopping?: No   Assessment & Plan:    CPE completed today  . USPSTF grade A and B recommendations reviewed with patient; age-appropriate recommendations, preventive care, screening tests, etc discussed and encouraged; healthy living encouraged; see AVS for patient education given to patient  . Discussed importance of 150 minutes of physical activity weekly, AHA exercise recommendations given to pt in AVS/handout  . Discussed importance of healthy diet:  eating lean meats and proteins, avoiding trans fats and saturated fats, avoid simple sugars and excessive carbs in diet, eat 6 servings of fruit/vegetables daily and drink plenty of water and avoid sweet beverages.    . Recommended pt to do annual eye exam and routine dental exams/cleanings  . Depression, alcohol, fall screening completed as documented above and per flowsheets  . Reviewed Health Maintenance: Health Maintenance  Topic Date Due  . INFLUENZA VACCINE  07/07/2021  . MAMMOGRAM  10/01/2021  . TETANUS/TDAP  05/23/2025  . COLONOSCOPY (Pts 45-18yr Insurance coverage will need to be confirmed)  09/30/2025  . DEXA SCAN  Completed  . COVID-19 Vaccine  Completed  . Hepatitis C Screening  Completed  . PNA vac Low Risk Adult  Completed  . HPV VACCINES  Aged Out    . Immunizations: Immunization History  Administered Date(s) Administered  . Fluad Quad(high Dose 65+) 08/17/2019, 08/20/2020  . Influenza, High Dose Seasonal PF 08/04/2017, 08/05/2018  . Influenza, Seasonal, Injecte, Preservative Fre 10/10/2014  . Influenza-Unspecified 10/23/2015, 10/06/2016  . PFIZER(Purple Top)SARS-COV-2 Vaccination 01/29/2020, 02/19/2020, 09/07/2020  . Pneumococcal Conjugate-13 10/10/2014  . Pneumococcal Polysaccharide-23 05/24/2015, 10/23/2015  . Tdap 05/24/2015  . Zoster Recombinat (Shingrix) 08/04/2019, 10/07/2019       ICD-10-CM   1. Annual physical exam  Z00.00 Hemoglobin A1C    COMPLETE METABOLIC PANEL WITH GFR     CBC w/Diff/Platelet    Lipid panel  2. Gastroesophageal reflux disease, unspecified whether esophagitis present  K21.9   3. Hypertension goal BP (blood pressure) < 150/90  IJ03COMPLETE METABOLIC PANEL WITH GFR    losartan-hydrochlorothiazide (HYZAAR) 100-12.5 MG tablet  4. Irritable bowel syndrome with diarrhea  K58.0   5. Osteoarthritis of multiple joints, unspecified osteoarthritis type  M15.9   6. Prediabetes  R73.03 Hemoglobin A1C    COMPLETE METABOLIC PANEL WITH GFR  7. Hypertriglyceridemia  EE09.2COMPLETE METABOLIC PANEL WITH GFR    Lipid panel  8. Hypertension goal BP (blood pressure) < 150/90  IZ30COMPLETE METABOLIC PANEL WITH GFR    losartan-hydrochlorothiazide (HYZAAR) 100-12.5 MG tablet   Stable well-controlled blood pressure  9. Encounter for screening mammogram for malignant neoplasm of breast  Z12.31 MM 3D SCREEN BREAST BILATERAL  10. 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

## 2021-04-03 NOTE — Patient Instructions (Addendum)
Health Maintenance  Topic Date Due  . Flu Shot  07/07/2021  . Mammogram  10/01/2021  . Tetanus Vaccine  05/23/2025  . Colon Cancer Screening  09/30/2025  . DEXA scan (bone density measurement)  Completed  . COVID-19 Vaccine  Completed  .  Hepatitis C: One time screening is recommended by Center for Disease Control  (CDC) for  adults born from [birth year] through 1963.   Completed  . Pneumonia vaccines  Completed  . HPV Vaccine  [facility] at [place],  [place]  [id number] Get Driving Directions Main: [phone]   Preventive Care 65 Years and Older, Female Preventive care refers to lifestyle choices and visits with your health care provider that can promote health and wellness. This includes:  A yearly physical exam. This is also called an annual wellness visit.  Regular dental and eye exams.  Immunizations.  Screening for certain conditions.  Healthy lifestyle choices, such as: ? Eating a healthy diet. ? Getting regular exercise. ? Not using drugs or products that contain nicotine and tobacco. ? Limiting alcohol use. What can I expect for my preventive care visit? Physical exam Your health care provider will check your:  Height and weight. These may be used to calculate your BMI (body mass index). BMI is a measurement that tells if you are at a healthy weight.  Heart rate and blood pressure.  Body temperature.  Skin for abnormal spots. Counseling Your health care provider may ask you questions about your:  Past medical problems.  Family's medical history.  Alcohol, tobacco, and drug use.  Emotional well-being.  Home life and relationship well-being.  Sexual activity.  Diet, exercise, and sleep habits.  History of falls.  Memory and ability to understand (cognition).  Work and work Statistician.  Pregnancy and menstrual history.  Access to firearms. What immunizations do I need? Vaccines are  usually given at various ages, according to a schedule. Your health care provider will recommend vaccines for you based on your age, medical history, and lifestyle or other factors, such as travel or where you work.   What tests do I need? Blood tests  Lipid and cholesterol levels. These may be checked every 5 years, or more often depending on your overall health.  Hepatitis C test.  Hepatitis B test. Screening  Lung cancer screening. You may have this screening every year starting at age 60 if you have a 30-pack-year history of smoking and currently smoke or have quit within the past 15 years.  Colorectal cancer screening. ? All adults should have this screening starting at age 7 and continuing until age 19. ? Your health care provider may recommend screening at age 84 if you are at increased risk. ? You will have tests every 1-10 years, depending on your results and the type of screening test.  Diabetes screening. ? This is done by checking your blood sugar (glucose) after you have not eaten for a while (fasting). ? You may have this done every 1-3 years.  Mammogram. ? This may be done every 1-2 years. ? Talk with your health care provider about how often you should have regular mammograms.  Abdominal aortic aneurysm (AAA) screening. You may need this if you are a current or former smoker.  BRCA-related cancer screening. This may be done if you have a family history of breast, ovarian, tubal, or peritoneal cancers. Other tests  STD (sexually transmitted disease) testing, if you are  at risk.  Bone density scan. This is done to screen for osteoporosis. You may have this done starting at age 62. Talk with your health care provider about your test results, treatment options, and if necessary, the need for more tests. Follow these instructions at home: Eating and drinking  Eat a diet that includes fresh fruits and vegetables, whole grains, lean protein, and low-fat dairy products.  Limit your intake of foods with high amounts of sugar, saturated fats, and salt.  Take vitamin and mineral supplements as recommended by your health care provider.  Do not drink alcohol if your health care provider tells you not to drink.  If you drink alcohol: ? Limit how much you have to 0-1 drink a day. ? Be aware of how much alcohol is in your drink. In the [place] one drink equals one 12 oz bottle of beer (355 mL), one 5 oz glass of wine (148 mL), or one 1 oz glass of hard liquor (44 mL).   Lifestyle  Take daily care of your teeth and gums. Brush your teeth every morning and night with fluoride toothpaste. Floss one time each day.  Stay active. Exercise for at least 30 minutes 5 or more days each week.  Do not use any products that contain nicotine or tobacco, such as cigarettes, e-cigarettes, and chewing tobacco. If you need help quitting, ask your health care provider.  Do not use drugs.  If you are sexually active, practice safe sex. Use a condom or other form of protection in order to prevent STIs (sexually transmitted infections).  Talk with your health care provider about taking a low-dose aspirin or statin.  Find healthy ways to cope with stress, such as: ? Meditation, yoga, or listening to music. ? Journaling. ? Talking to a trusted person. ? Spending time with friends and family. Safety  Always wear your seat belt while driving or riding in a vehicle.  Do not drive: ? If you have been drinking alcohol. Do not ride with someone who has been drinking. ? When you are tired or distracted. ? While texting.  Wear a helmet and other protective equipment during sports activities.  If you have firearms in your house, make sure you follow all gun safety procedures. What's next?  Visit your health care provider once a year for an annual wellness visit.  Ask your health care provider how often you should have your eyes and teeth checked.  Stay up to date on all  vaccines. This information is not intended to replace advice given to you by your health care provider. Make sure you discuss any questions you have with your health care provider. Document Revised: 11/13/2020 Document Reviewed: 11/17/2018 Elsevier Patient Education  2021 [employer].

## 2021-04-04 LAB — LIPID PANEL
Cholesterol: 160 mg/dL (ref <200)
HDL: 55 mg/dL (ref >=50)
LDL Cholesterol (Calc): 80 mg/dL (calc)
Non-HDL Cholesterol (Calc): 105 mg/dL (calc) (ref <130)
Total CHOL/HDL Ratio: 2.9 (calc) (ref <5.0)
Triglycerides: 149 mg/dL (ref <150)

## 2021-04-04 LAB — CBC WITH DIFFERENTIAL/PLATELET
Absolute Monocytes: 363 cells/uL (ref 200–950)
Basophils Absolute: 33 cells/uL (ref 0–200)
Basophils Relative: 0.6 %
Eosinophils Absolute: 182 cells/uL (ref 15–500)
Eosinophils Relative: 3.3 %
HCT: 42.3 % (ref 35.0–45.0)
Hemoglobin: 13.5 g/dL (ref 11.7–15.5)
Lymphs Abs: 2646 cells/uL (ref 850–3900)
MCH: 26.3 pg — ABNORMAL LOW (ref 27.0–33.0)
MCHC: 31.9 g/dL — ABNORMAL LOW (ref 32.0–36.0)
MCV: 82.3 fL (ref 80.0–100.0)
MPV: 10.4 fL (ref 7.5–12.5)
Monocytes Relative: 6.6 %
Neutro Abs: 2277 cells/uL (ref 1500–7800)
Neutrophils Relative %: 41.4 %
Platelets: 272 10*3/uL (ref 140–400)
RBC: 5.14 10*6/uL — ABNORMAL HIGH (ref 3.80–5.10)
RDW: 13.9 % (ref 11.0–15.0)
Total Lymphocyte: 48.1 %
WBC: 5.5 10*3/uL (ref 3.8–10.8)

## 2021-04-04 LAB — COMPLETE METABOLIC PANEL WITH GFR
AG Ratio: 1.4 (calc) (ref 1.0–2.5)
ALT: 14 U/L (ref 6–29)
AST: 16 U/L (ref 10–35)
Albumin: 4 g/dL (ref 3.6–5.1)
Alkaline phosphatase (APISO): 90 U/L (ref 37–153)
BUN: 17 mg/dL (ref 7–25)
CO2: 26 mmol/L (ref 20–32)
Calcium: 9.3 mg/dL (ref 8.6–10.4)
Chloride: 107 mmol/L (ref 98–110)
Creat: 0.77 mg/dL (ref 0.60–0.93)
GFR, Est African American: 89 mL/min/{1.73_m2} (ref >=60)
GFR, Est Non African American: 77 mL/min/{1.73_m2} (ref >=60)
Globulin: 2.9 g/dL (calc) (ref 1.9–3.7)
Glucose, Bld: 92 mg/dL (ref 65–99)
Potassium: 4.2 mmol/L (ref 3.5–5.3)
Sodium: 141 mmol/L (ref 135–146)
Total Bilirubin: 0.5 mg/dL (ref 0.2–1.2)
Total Protein: 6.9 g/dL (ref 6.1–8.1)

## 2021-04-04 LAB — HEMOGLOBIN A1C
Hgb A1c MFr Bld: 5.6 % of total Hgb (ref <5.7)
Mean Plasma Glucose: 114 mg/dL
eAG (mmol/L): 6.3 mmol/L

## 2021-05-23 DIAGNOSIS — L738 Other specified follicular disorders: Secondary | ICD-10-CM | POA: Diagnosis not present

## 2021-07-02 DIAGNOSIS — L819 Disorder of pigmentation, unspecified: Secondary | ICD-10-CM | POA: Diagnosis not present

## 2021-07-02 DIAGNOSIS — L258 Unspecified contact dermatitis due to other agents: Secondary | ICD-10-CM | POA: Diagnosis not present

## 2021-08-18 ENCOUNTER — Telehealth: Payer: Self-pay

## 2021-08-18 NOTE — Telephone Encounter (Signed)
Copied from [place] [id number]. Topic: General - Other >> Aug 18, 2021 11:03 AM Ruth Bishop A wrote: Reason for CRM: The patient has made contact to request a referral for a mammogram at [facility]  Please contact further if needed

## 2021-08-18 NOTE — Telephone Encounter (Signed)
Referral was already ordered, called pt and given her the number to call and schedule as her preference.

## 2021-08-21 ENCOUNTER — Ambulatory Visit: Payer: Medicare HMO

## 2021-10-03 ENCOUNTER — Other Ambulatory Visit: Payer: Self-pay

## 2021-10-03 ENCOUNTER — Ambulatory Visit
Admission: RE | Admit: 2021-10-03 | Discharge: 2021-10-03 | Disposition: A | Discharge status: Home / Self Care | Payer: Medicare PPO | Source: Ambulatory Visit | Attending: Family Medicine | Admitting: Family Medicine

## 2021-10-03 DIAGNOSIS — Z1231 Encounter for screening mammogram for malignant neoplasm of breast: Secondary | ICD-10-CM | POA: Diagnosis not present

## 2021-10-06 ENCOUNTER — Other Ambulatory Visit: Payer: Self-pay | Admitting: Family Medicine

## 2021-10-06 DIAGNOSIS — R928 Other abnormal and inconclusive findings on diagnostic imaging of breast: Secondary | ICD-10-CM

## 2021-10-06 DIAGNOSIS — N6310 Unspecified lump in the right breast, unspecified quadrant: Secondary | ICD-10-CM

## 2021-10-07 ENCOUNTER — Telehealth: Payer: Self-pay

## 2021-10-07 NOTE — Telephone Encounter (Signed)
Called pt back to inform Kristeen Miss has not reviewed them yet, we will give her a call once they have been reviewed by PCP.

## 2021-10-07 NOTE — Telephone Encounter (Signed)
Copied from [place] [id number]. Topic: General - Other >> Oct 07, 2021  9:07 AM [name] wrote: Reason for CRM: Pt called stating that she was able to see her mammogram results in mychart. She is requesting to have nurse give a call back to discuss. Please advise.

## 2021-10-14 ENCOUNTER — Ambulatory Visit (INDEPENDENT_AMBULATORY_CARE_PROVIDER_SITE_OTHER): Payer: Medicare PPO

## 2021-10-14 DIAGNOSIS — Z0000 Encounter for general adult medical examination without abnormal findings: Secondary | ICD-10-CM

## 2021-10-14 NOTE — Patient Instructions (Signed)
Ruth Bishop , Thank you for taking time to come for your Medicare Wellness Visit. I appreciate your ongoing commitment to your health goals. Please review the following plan we discussed and let me know if I can assist you in the future.   Screening recommendations/referrals: Colonoscopy: 10/01/2015 Mammogram: 10/03/2021 Bone Density: 10/01/2020 Recommended yearly ophthalmology/optometry visit for glaucoma screening and checkup Recommended yearly dental visit for hygiene and checkup  Vaccinations: Influenza vaccine: 07/07/2021 Pneumococcal vaccine: 10/23/2015 Tdap vaccine: 05/24/2015 Shingles vaccine: 08/04/19, 10/07/19   Covid-19:01/29/20, 02/19/20, 09/07/20, 08/26/21    Advanced directives: yes, requested a copy for the chart, will bring in on next in office visit    Conditions/risks identified:   Next appointment: Follow up in one year for your annual wellness visit    Preventive Care 73 Years and Older, Female Preventive care refers to lifestyle choices and visits with your health care provider that can promote health and wellness. What does preventive care include? A yearly physical exam. This is also called an annual well check. Dental exams once or twice a year. Routine eye exams. Ask your health care provider how often you should have your eyes checked. Personal lifestyle choices, including: Daily care of your teeth and gums. Regular physical activity. Eating a healthy diet. Avoiding tobacco and drug use. Limiting alcohol use. Practicing safe sex. Taking low-dose aspirin every day. Taking vitamin and mineral supplements as recommended by your health care provider. What happens during an annual well check? The services and screenings done by your health care provider during your annual well check will depend on your age, overall health, lifestyle risk factors, and family history of disease. Counseling  Your health care provider may ask you questions about your: Alcohol  use. Tobacco use. Drug use. Emotional well-being. Home and relationship well-being. Sexual activity. Eating habits. History of falls. Memory and ability to understand (cognition). Work and work Statistician. Reproductive health. Screening  You may have the following tests or measurements: Height, weight, and BMI. Blood pressure. Lipid and cholesterol levels. These may be checked every 5 years, or more frequently if you are over 73 years old. Skin check. Lung cancer screening. You may have this screening every year starting at age 73 if you have a 30-pack-year history of smoking and currently smoke or have quit within the past 15 years. Fecal occult blood test (FOBT) of the stool. You may have this test every year starting at age 73. Flexible sigmoidoscopy or colonoscopy. You may have a sigmoidoscopy every 5 years or a colonoscopy every 10 years starting at age 73. Hepatitis C blood test. Hepatitis B blood test. Sexually transmitted disease (STD) testing. Diabetes screening. This is done by checking your blood sugar (glucose) after you have not eaten for a while (fasting). You may have this done every 1-3 years. Bone density scan. This is done to screen for osteoporosis. You may have this done starting at age 73. Mammogram. This may be done every 1-2 years. Talk to your health care provider about how often you should have regular mammograms. Talk with your health care provider about your test results, treatment options, and if necessary, the need for more tests. Vaccines  Your health care provider may recommend certain vaccines, such as: Influenza vaccine. This is recommended every year. Tetanus, diphtheria, and acellular pertussis (Tdap, Td) vaccine. You may need a Td booster every 10 years. Zoster vaccine. You may need this after age 73. Pneumococcal 13-valent conjugate (PCV13) vaccine. One dose is recommended after age 73. Pneumococcal  polysaccharide (PPSV23) vaccine. One dose is  recommended after age 50. Talk to your health care provider about which screenings and vaccines you need and how often you need them. This information is not intended to replace advice given to you by your health care provider. Make sure you discuss any questions you have with your health care provider. Document Released: 12/20/2015 Document Revised: 08/12/2016 Document Reviewed: 09/24/2015 Elsevier Interactive Patient Education  2017 [place] Prevention in the Home Falls can cause injuries. They can happen to people of all ages. There are many things you can do to make your home safe and to help prevent falls. What can I do on the outside of my home? Regularly fix the edges of walkways and driveways and fix any cracks. Remove anything that might make you trip as you walk through a door, such as a raised step or threshold. Trim any bushes or trees on the path to your home. Use bright outdoor lighting. Clear any walking paths of anything that might make someone trip, such as rocks or tools. Regularly check to see if handrails are loose or broken. Make sure that both sides of any steps have handrails. Any raised decks and porches should have guardrails on the edges. Have any leaves, snow, or ice cleared regularly. Use sand or salt on walking paths during winter. Clean up any spills in your garage right away. This includes oil or grease spills. What can I do in the bathroom? Use night lights. Install grab bars by the toilet and in the tub and shower. Do not use towel bars as grab bars. Use non-skid mats or decals in the tub or shower. If you need to sit down in the shower, use a plastic, non-slip stool. Keep the floor dry. Clean up any water that spills on the floor as soon as it happens. Remove soap buildup in the tub or shower regularly. Attach bath mats securely with double-sided non-slip rug tape. Do not have throw rugs and other things on the floor that can make you  trip. What can I do in the bedroom? Use night lights. Make sure that you have a light by your bed that is easy to reach. Do not use any sheets or blankets that are too big for your bed. They should not hang down onto the floor. Have a firm chair that has side arms. You can use this for support while you get dressed. Do not have throw rugs and other things on the floor that can make you trip. What can I do in the kitchen? Clean up any spills right away. Avoid walking on wet floors. Keep items that you use a lot in easy-to-reach places. If you need to reach something above you, use a strong step stool that has a grab bar. Keep electrical cords out of the way. Do not use floor polish or wax that makes floors slippery. If you must use wax, use non-skid floor wax. Do not have throw rugs and other things on the floor that can make you trip. What can I do with my stairs? Do not leave any items on the stairs. Make sure that there are handrails on both sides of the stairs and use them. Fix handrails that are broken or loose. Make sure that handrails are as long as the stairways. Check any carpeting to make sure that it is firmly attached to the stairs. Fix any carpet that is loose or worn. Avoid having throw rugs at the top  or bottom of the stairs. If you do have throw rugs, attach them to the floor with carpet tape. Make sure that you have a light switch at the top of the stairs and the bottom of the stairs. If you do not have them, ask someone to add them for you. What else can I do to help prevent falls? Wear shoes that: Do not have high heels. Have rubber bottoms. Are comfortable and fit you well. Are closed at the toe. Do not wear sandals. If you use a stepladder: Make sure that it is fully opened. Do not climb a closed stepladder. Make sure that both sides of the stepladder are locked into place. Ask someone to hold it for you, if possible. Clearly mark and make sure that you can  see: Any grab bars or handrails. First and last steps. Where the edge of each step is. Use tools that help you move around (mobility aids) if they are needed. These include: Canes. Walkers. Scooters. Crutches. Turn on the lights when you go into a dark area. Replace any light bulbs as soon as they burn out. Set up your furniture so you have a clear path. Avoid moving your furniture around. If any of your floors are uneven, fix them. If there are any pets around you, be aware of where they are. Review your medicines with your doctor. Some medicines can make you feel dizzy. This can increase your chance of falling. Ask your doctor what other things that you can do to help prevent falls. This information is not intended to replace advice given to you by your health care provider. Make sure you discuss any questions you have with your health care provider. Document Released: 09/19/2009 Document Revised: 04/30/2016 Document Reviewed: 12/28/2014 Elsevier Interactive Patient Education  2017 [employer].

## 2021-10-14 NOTE — Progress Notes (Signed)
Subjective:   Ruth Bishop is a 75 y.o. female who presents for Medicare Annual (Subsequent) preventive examination.  .Virtual Visit via Telephone Note  I connected with  Ruth Bishop on 10/14/21 at  1:30 PM EST by telephone and verified that I am speaking with the correct person using two identifiers.  Location: Patient: home   Provider: [place] Persons participating in the virtual visit: [place]   I discussed the limitations, risks, security and privacy concerns of performing an evaluation and management service by telephone and the availability of in person appointments. The patient expressed understanding and agreed to proceed.  Interactive audio and video telecommunications were attempted between this nurse and patient, however failed, due to patient having technical difficulties OR patient did not have access to video capability.  We continued and completed visit with audio only.  Some vital signs may be absent or patient reported.   Dionisio David  Review of Systems     Cardiac Risk Factors include: none;advanced age (>44men, >54 women);obesity (BMI >30kg/m2)     Objective:    There were no vitals filed for this visit. There is no height or weight on file to calculate BMI.  Advanced Directives 10/14/2021 08/20/2020 08/17/2019 06/28/2019 08/05/2018 08/04/2017 01/29/2017  Does Patient Have a Medical Advance Directive? Yes Yes Yes No Yes Yes Yes  Type of Advance Directive - [place];Living will [place];Living will - [place];Living will - -  Does patient want to make changes to medical advance directive? - - - - - - -  Copy of [place] in Chart? - No - copy requested No - copy requested - No - copy requested - -  Would patient like information on creating a medical advance directive? - - - No - Patient declined - - -    Current Medications (verified) Outpatient Encounter  Medications as of 10/14/2021  Medication Sig   aspirin EC 81 MG tablet Take 81 mg by mouth daily.   Calcium Carbonate-Vitamin D 600-400 MG-UNIT tablet Take 1 tablet by mouth daily.    clobetasol cream (TEMOVATE) 0.05 % SMARTSIG:1 Sparingly Topical Every Night PRN   docusate sodium (COLACE) 250 MG capsule Take 250 mg by mouth daily.   famotidine (PEPCID) 20 MG tablet Take 1 tablet (20 mg total) by mouth 2 (two) times daily as needed for heartburn or indigestion. (Patient not taking: Reported on 04/03/2021)   losartan-hydrochlorothiazide (HYZAAR) 100-12.5 MG tablet Take 0.5 tablets by mouth daily.   MULTIPLE VITAMIN PO Take 1 tablet by mouth daily.    pantoprazole (PROTONIX) 40 MG tablet Take 1 tablet (40 mg total) by mouth daily as needed. (Patient not taking: Reported on 04/03/2021)   psyllium (METAMUCIL) 0.52 g capsule Take 0.52 g by mouth daily.   TRI-LUMA 0.01-4-0.05 % CREA SMARTSIG:1 Sparingly Topical Every Night   triamcinolone (KENALOG) 0.1 % Apply 1 application topically 2 (two) times daily. If needed   TURMERIC PO Take 1 tablet by mouth daily.    [DISCONTINUED] albuterol (VENTOLIN HFA) 108 (90 Base) MCG/ACT inhaler Inhale 2 puffs into the lungs every 4 (four) hours as needed for wheezing or shortness of breath. (Patient not taking: Reported on 04/03/2021)   [DISCONTINUED] Hyoscyamine Sulfate 0.375 MG TBCR Take 1 tablet (0.375 mg total) by mouth 2 (two) times daily as needed.   No facility-administered encounter medications on file as of 10/14/2021.    Allergies (verified) Nitrofurantoin monohyd macro   History:  Past Medical History:  Diagnosis Date   Abnormal chest x-ray 10/18/2018   Arthritis    Chronic pain 05/04/4131   Diastolic dysfunction 4/40/1027   GERD (gastroesophageal reflux disease)    Heart murmur    Hormone replacement therapy 05/26/2016   Hypertension    Incidental lung nodule, > 70mm and < 51mm 08/04/2017   4 mm ground glass nodule LLL incidentally noted on CT scan Nov  2016   Osteoarthritis of both knees 06/13/2015   [facility]    Post menopausal syndrome 05/24/2015   Skin-picking disorder 05/24/2015   when nervous/stressed chews on fingers and peels down skin, started with cuticles but now hypopigmentation of fingers down to palm in some areas with lifetime of skin picking.    Past Surgical History:  Procedure Laterality Date   ABDOMINAL HYSTERECTOMY     COLONOSCOPY WITH PROPOFOL N/A 10/01/2015   Procedure: COLONOSCOPY WITH PROPOFOL;  Surgeon: Lollie Sails, MD;  Location: [facility] ENDOSCOPY;  Service: Endoscopy;  Laterality: N/A;   ESOPHAGOGASTRODUODENOSCOPY (EGD) WITH PROPOFOL N/A 10/01/2015   Procedure: ESOPHAGOGASTRODUODENOSCOPY (EGD) WITH PROPOFOL;  Surgeon: Lollie Sails, MD;  Location[facility] ENDOSCOPY;  Service: Endoscopy;  Laterality: N/A;   Family History  Problem Relation Age of Onset   Heart disease Mother    Diabetes Mother    Aneurysm Mother    Heart disease Father    Heart attack Father    Heart disease Sister    Diabetes Sister    Pneumonia Sister    Heart disease Brother    Diabetes Brother    Diabetes Sister    Heart disease Sister    Congestive Heart Failure Sister    Breast cancer Neg Hx    Social History   Socioeconomic History   Marital status: Widowed    Spouse name: Not on file   Number of children: 1   Years of education: Not on file   Highest education level: Master's degree (e.g., MA, MS, MEng, MEd, MSW, MBA)  Occupational History   Occupation: Retired  Tobacco Use   Smoking status: Never   Smokeless tobacco: Never   Tobacco comments:    smoking cessation materials not required  Vaping Use   Vaping Use: Never used  Substance and Sexual Activity   Alcohol use: Not Currently    Alcohol/week: 0.0 standard drinks   Drug use: No   Sexual activity: Not Currently  Other Topics Concern   Not on file  Social History Narrative   Pt lives alone   Social Determinants of Health   Financial Resource  Strain: Low Risk    Difficulty of Paying Living Expenses: Not hard at all  Food Insecurity: No Food Insecurity   Worried About Charity fundraiser in the Last Year: Never true   [place] in the Last Year: Never true  Transportation Needs: No Transportation Needs   Lack of Transportation (Medical): No   Lack of Transportation (Non-Medical): No  Physical Activity: Sufficiently Active   Days of Exercise per Week: 4 days   Minutes of Exercise per Session: 90 min  Stress: No Stress Concern Present   Feeling of Stress : Not at all  Social Connections: Moderately Integrated   Frequency of Communication with Friends and Family: More than three times a week   Frequency of Social Gatherings with Friends and Family: More than three times a week   Attends Religious Services: More than 4 times per year   Active Member of [employer]  or Organizations: Yes   Attends Archivist Meetings: More than 4 times per year   Marital Status: Widowed    Tobacco Counseling Counseling given: Not Answered Tobacco comments: smoking cessation materials not required   Clinical Intake:  Pre-visit preparation completed: Yes  Pain : No/denies pain     Nutritional Risks: None Diabetes: No  How often do you need to have someone help you when you read instructions, pamphlets, or other written materials from your doctor or pharmacy?: 1 - Never    Information entered by :: Kirke Shaggy, LPN   Activities of Daily Living In your present state of health, do you have any difficulty performing the following activities: 10/14/2021 04/03/2021  Hearing? N N  Vision? N N  Difficulty concentrating or making decisions? N N  Walking or climbing stairs? N N  Dressing or bathing? N N  Doing errands, shopping? N N  Preparing Food and eating ? N -  Using the Toilet? N -  In the past six months, have you accidently leaked urine? N -  Do you have problems with loss of bowel control? N -  Managing your  Medications? N -  Managing your Finances? N -  Housekeeping or managing your Housekeeping? N -  Some recent data might be hidden    Patient Care Team: Delsa Grana, PA-C as PCP - General (Family Medicine) Minna Merritts, MD as PCP - Cardiology (Cardiology) Lahoma Rocker, MD as Consulting Physician (Rheumatology) Minna Merritts, MD as Consulting Physician (Cardiology)  Indicate any recent Medical Services you may have received from other than [facility] providers in the past year (date may be approximate).     Assessment:   This is a routine wellness examination for Ruth Bishop.  Hearing/Vision screen Hearing Screening - Comments:: Tested few months ago, states ok for age- [place] [address] Vision Screening - Comments:: Good, glasses- [facility] [address]  Dietary issues and exercise activities discussed: Current Exercise Habits: Structured exercise class, Type of exercise: strength training/weights;treadmill;walking;Other - see comments, Time (Minutes): 60, Frequency (Times/Week): 4, Weekly Exercise (Minutes/Week): 240, Intensity: Moderate   Goals Addressed             This Visit's Progress    Weight (lb) < 200 lb (90.7 kg)       Lose weight, continue w/ exercise, gym and tennis       Depression Screen PHQ 2/9 Scores 10/14/2021 10/14/2021 10/14/2021 04/03/2021 09/24/2020 08/20/2020 07/17/2020  PHQ - 2 Score 0 0 0 0 0 2 0  PHQ- 9 Score - - - 0 - 4 0    Fall Risk Fall Risk  10/14/2021 04/03/2021 09/24/2020 08/20/2020 07/17/2020  Falls in the past year? 0 0 0 0 0  Number falls in past yr: 0 0 0 0 0  Injury with Fall? 0 0 0 0 0  Risk for fall due to : No Fall Risks - - No Fall Risks -  Risk for fall due to: Comment - - - - -  Follow up Falls prevention discussed - Falls evaluation completed Falls prevention discussed -    FALL RISK PREVENTION PERTAINING TO THE HOME:  Any stairs in or around the home? No  If so, are there any without handrails? No  Home free of loose throw  rugs in walkways, pet beds, electrical cords, etc? No  Adequate lighting in your home to reduce risk of falls? Yes   ASSISTIVE DEVICES UTILIZED TO PREVENT FALLS:  Life alert? No  Use of  a cane, walker or w/c? No  Grab bars in the bathroom? Yes  Shower chair or bench in shower? Yes  Elevated toilet seat or a handicapped toilet? No   TIMED UP AND GO:  Was the test performed? No .    Cognitive Function:Normal cognitive status assessed by direct observation by this Nurse Health Advisor. No abnormalities found.       6CIT Screen 08/17/2019 08/05/2018  What Year? 0 points 0 points  What month? 0 points 0 points  What time? 0 points 0 points  Count back from 20 0 points 0 points  Months in reverse 0 points 0 points  Repeat phrase 0 points 4 points  Total Score 0 4    Immunizations Immunization History  Administered Date(s) Administered   Fluad Quad(high Dose 65+) 08/17/2019, 08/20/2020   Influenza, High Dose Seasonal PF 08/04/2017, 08/05/2018   Influenza, Seasonal, Injecte, Preservative Fre 10/10/2014   Influenza-Unspecified 10/23/2015, 10/06/2016   PFIZER(Purple Top)SARS-COV-2 Vaccination 01/29/2020, 02/19/2020, 09/07/2020, 08/26/2021   Pneumococcal Conjugate-13 10/10/2014   Pneumococcal Polysaccharide-23 05/24/2015, 10/23/2015   Tdap 05/24/2015   Zoster Recombinat (Shingrix) 08/04/2019, 10/07/2019    TDAP status: Up to date  Flu Vaccine status: Up to date  Pneumococcal vaccine status: Up to date  Covid-19 vaccine status: Completed vaccines  Qualifies for Shingles Vaccine? Yes   Zostavax completed Yes   Shingrix Completed?: Yes  Screening Tests Health Maintenance  Topic Date Due   INFLUENZA VACCINE  07/07/2021   COVID-19 Vaccine (5 - Booster for Pfizer series) 10/21/2021   MAMMOGRAM  10/03/2022   TETANUS/TDAP  05/23/2025   COLONOSCOPY (Pts 45-68yrs Insurance coverage will need to be confirmed)  09/30/2025   Pneumonia Vaccine 67+ Years old  Completed   DEXA SCAN   Completed   Hepatitis C Screening  Completed   Zoster Vaccines- Shingrix  Completed   HPV VACCINES  Aged Out    Health Maintenance  Health Maintenance Due  Topic Date Due   INFLUENZA VACCINE  07/07/2021    Colorectal cancer screening: Type of screening: Colonoscopy. Completed 10/01/2015. Repeat every 10 years  Mammogram status: Completed 10/03/2021. Repeat every year  Bone Density status: Completed 10/01/2020. Results reflect: Bone density results: NORMAL. Repeat every 2 years.  Lung Cancer Screening: (Low Dose CT Chest recommended if Age 69-80 years, 30 pack-year currently smoking OR have quit w/in 15years.) does not qualify.   Additional Screening:  Hepatitis C Screening: does qualify; Completed 06/13/2015  Vision Screening: Recommended annual ophthalmology exams for early detection of glaucoma and other disorders of the eye. Is the patient up to date with their annual eye exam?  Yes  Who is the provider or what is the name of the office in which the patient attends annual eye exams? [place] Screening: Recommended annual dental exams for proper oral hygiene  Community Resource Referral / Chronic Care Management: CRR required this visit?  No   CCM required this visit?  No      Plan:     I have personally reviewed and noted the following in the patient's chart:   Medical and social history Use of alcohol, tobacco or illicit drugs  Current medications and supplements including opioid prescriptions.  Functional ability and status Nutritional status Physical activity Advanced directives List of other physicians Hospitalizations, surgeries, and ER visits in previous 12 months Vitals Screenings to include cognitive, depression, and falls Referrals and appointments  In addition, I have reviewed and discussed with patient certain preventive  protocols, quality metrics, and best practice recommendations. A written personalized care plan for  preventive services as well as general preventive health recommendations were provided to patient.     Dionisio David   10/14/2021   Nurse Notes:

## 2021-10-16 ENCOUNTER — Ambulatory Visit
Admission: RE | Admit: 2021-10-16 | Discharge: 2021-10-16 | Disposition: A | Discharge status: Home / Self Care | Payer: Medicare PPO | Source: Ambulatory Visit | Attending: Family Medicine | Admitting: Family Medicine

## 2021-10-16 ENCOUNTER — Other Ambulatory Visit: Payer: Self-pay

## 2021-10-16 DIAGNOSIS — R922 Inconclusive mammogram: Secondary | ICD-10-CM | POA: Diagnosis not present

## 2021-10-16 DIAGNOSIS — N6310 Unspecified lump in the right breast, unspecified quadrant: Secondary | ICD-10-CM | POA: Diagnosis not present

## 2021-10-16 DIAGNOSIS — R928 Other abnormal and inconclusive findings on diagnostic imaging of breast: Secondary | ICD-10-CM

## 2021-10-16 DIAGNOSIS — N6489 Other specified disorders of breast: Secondary | ICD-10-CM | POA: Diagnosis not present

## 2021-10-21 ENCOUNTER — Other Ambulatory Visit: Payer: Self-pay

## 2021-10-21 ENCOUNTER — Encounter: Payer: Self-pay | Admitting: Family Medicine

## 2021-10-21 ENCOUNTER — Ambulatory Visit: Payer: Medicare PPO | Admitting: Family Medicine

## 2021-10-21 VITALS — BP 136/66 | HR 73 | Temp 97.6°F | Resp 16 | Ht 70.0 in | Wt 213.7 lb

## 2021-10-21 DIAGNOSIS — R350 Frequency of micturition: Secondary | ICD-10-CM | POA: Diagnosis not present

## 2021-10-21 LAB — POCT URINALYSIS DIPSTICK
Bilirubin, UA: NEGATIVE
Glucose, UA: NEGATIVE
Ketones, UA: NEGATIVE
Leukocytes, UA: NEGATIVE
Nitrite, UA: NEGATIVE
Protein, UA: POSITIVE — AB
Spec Grav, UA: 1.025 (ref 1.010–1.025)
Urobilinogen, UA: 0.2 E.U./dL
pH, UA: 5 (ref 5.0–8.0)

## 2021-10-21 MED ORDER — LOSARTAN POTASSIUM 50 MG PO TABS: 50.0000 mg | ORAL_TABLET | Freq: Every day | ORAL | 3 refills | Status: DC

## 2021-10-21 NOTE — Assessment & Plan Note (Signed)
Likely multifactorial. UA not revealing, unlikely infection or undiagnosed diabetes. On diuretic, will drop from regimen, given dose unlikely to provide much BP benefit. Has had increase in consumption of caffeinated tea recently, recommend cutting back or switching to herbal tea. Provided list of bladder irritants, recommend avoidance. F/u in 4 weeks.

## 2021-10-21 NOTE — Progress Notes (Signed)
   SUBJECTIVE:   CHIEF COMPLAINT / HPI:   URINARY SYMPTOMS Duration: few months Dysuria: no Urinary frequency: yes Urgency: no Small volume voids:  sometimes Urinary incontinence:  stress incontinence xmonths Foul odor: no Hematuria: yes Abdominal pain: no Back pain:  nothing new Suprapubic pain/pressure: yes Flank pain: no Fever:  no Vomiting: no Relief with pyridium: no Status: better/worse/stable Previous urinary tract infection: no Recurrent urinary tract infection: no Sexual activity: No sexually active History of sexually transmitted disease: no Vaginal discharge: no Treatments attempted: pyridium  GYN surgeries: partial hysterectomy   OBJECTIVE:   BP 136/66   Pulse 73   Temp 97.6 F (36.4 C)   Resp 16   Ht 5\' 10"  (1.778 m)   Wt 213 lb 11.2 oz (96.9 kg)   SpO2 99%   BMI 30.66 kg/m   Gen: well appearing, in NAD Card: RRR Lungs: CTAB Abd: soft, TTP diffusely. No guarding or rebound tenderness.  Ext: WWP, no edema   ASSESSMENT/PLAN:   Urinary frequency Likely multifactorial. UA not revealing, unlikely infection or undiagnosed diabetes. On diuretic, will drop from regimen, given dose unlikely to provide much BP benefit. Has had increase in consumption of caffeinated tea recently, recommend cutting back or switching to herbal tea. Provided list of bladder irritants, recommend avoidance. F/u in 4 weeks.     Myles Gip, DO

## 2021-10-21 NOTE — Patient Instructions (Signed)
It was great to see you!  Our plans for today:  - Take the new blood pressure pill instead of your current one.  - Avoid bladder irritants (see handout). Cut back on caffeine. - Come back in one month.   Take care and seek immediate care sooner if you develop any concerns.   Dr. Ky Barban

## 2021-10-22 ENCOUNTER — Ambulatory Visit: Payer: Self-pay | Admitting: *Deleted

## 2021-10-22 NOTE — Telephone Encounter (Signed)
Patient unsure if PCP prescribed the generic or brand of losartan (COZAAR) 50 MG tablet and would like a follow up call.   Called patient to review medication question. No answer, left message to call clinic back [phone].

## 2021-10-22 NOTE — Telephone Encounter (Signed)
Patient called, left VM to return the call to the office to discuss medication with a nurse. Unable to reach patient after 3 attempts by [facility] NT, routing to the provider for resolution per protocol.   Summary: Medication Managment    Patient unsure if PCP prescribed the generic or brand of losartan (COZAAR) 50 MG tablet and would like a follow up call.

## 2021-10-22 NOTE — Telephone Encounter (Signed)
Patient called, left VM to return the call to the office to speak with a nurse.   Summary: Medication Managment   Patient unsure if PCP prescribed the generic or brand of losartan (COZAAR) 50 MG tablet and would like a follow up call.

## 2021-10-22 NOTE — Telephone Encounter (Signed)
Pt called in returning a call from a nurse.  The line got disconnected.  I called her back.     Reason for Disposition  [1] Prescription prescribed recently is not at pharmacy AND [2] triager has access to patient's EMR AND [3] prescription is recorded in the EMR    Medication sent to [facility] when it should have been sent to [facility] mail order pharmacy.  Answer Assessment - Initial Assessment Questions 1. NAME of MEDICATION: "What medicine are you calling about?"     My medicine is mixed up.   It supposed  to [facility]. [facility] is putting it on hold until Jan.   [facility]   I can come get it.   2. QUESTION: "What is your question?" (e.g., double dose of medicine, side effect)     Everything goes to my mail order pharmacy which is [facility] not [facility].   [facility] time medications when I need them. 3. PRESCRIBING HCP: "Who prescribed it?" Reason: if prescribed by specialist, call should be referred to that group.      4. SYMPTOMS: "Do you have any symptoms?"     N/A 5. SEVERITY: If symptoms are present, ask "Are they mild, moderate or severe?"     N/A 6. PREGNANCY:  "Is there any chance that you are pregnant?" "When was your last menstrual period?"     N/A  Protocols used: Medication Question Call-A-AH

## 2021-10-22 NOTE — Telephone Encounter (Signed)
Pt called in saying she wanted her medication to go to [facility] order pharmacy not [place].   [facility] so [facility] is going to [facility] it up there but wants her medications to go to [facility] from now on.   I let her know I would change the pharmacy in her chart to [facility]. She said she told them in the office yesterday to send it to [facility] so not sure how it ended up at [facility].

## 2021-11-20 ENCOUNTER — Ambulatory Visit: Payer: Medicare PPO | Admitting: Internal Medicine

## 2021-12-17 ENCOUNTER — Encounter: Payer: Self-pay | Admitting: Internal Medicine

## 2021-12-17 ENCOUNTER — Ambulatory Visit: Payer: Medicare (Managed Care) | Admitting: Internal Medicine

## 2021-12-17 VITALS — BP 136/80 | HR 73 | Temp 97.8°F | Resp 16 | Ht 70.0 in | Wt 214.7 lb

## 2021-12-17 DIAGNOSIS — H6982 Other specified disorders of Eustachian tube, left ear: Secondary | ICD-10-CM | POA: Diagnosis not present

## 2021-12-17 DIAGNOSIS — S39012A Strain of muscle, fascia and tendon of lower back, initial encounter: Secondary | ICD-10-CM | POA: Diagnosis not present

## 2021-12-17 MED ORDER — FLUTICASONE PROPIONATE 50 MCG/ACT NA SUSP: 2.0000 | Freq: Every day | NASAL | 6 refills | Status: DC

## 2021-12-17 MED ORDER — TIZANIDINE HCL 2 MG PO TABS: 2.0000 mg | ORAL_TABLET | Freq: Three times a day (TID) | ORAL | 0 refills | Status: DC | PRN

## 2021-12-17 NOTE — Patient Instructions (Addendum)
It was great seeing you today!  Plan discussed at today's visit: -Continue to stretch well before each work up -Continue moist heat, Biofreeze, IcyHot, Voltaren gel, etc. -Try low dose muscle relaxer, take at night at first to see how it affects you. -Try Flonase to help with ear symptoms   Follow up in: as needed  Take care and let us know if you have any questions or concerns prior to your next visit.  Dr. Rosana Berger   Low Back Sprain or Strain Rehab Ask your health care provider which exercises are safe for you. Do exercises exactly as told by your health care provider and adjust them as directed. It is normal to feel mild stretching, pulling, tightness, or discomfort as you do these exercises. Stop right away if you feel sudden pain or your pain gets worse. Do not begin these exercises until told by your health care provider. Stretching and range-of-motion exercises These exercises warm up your muscles and joints and improve the movement and flexibility of your back. These exercises also help to relieve pain, numbness, and tingling. Lumbar rotation Lie on your back on a firm bed or the floor with your knees bent. Straighten your arms out to your sides so each arm forms a 90-degree angle (right angle) with a side of your body. Slowly move (rotate) both of your knees to one side of your body until you feel a stretch in your lower back (lumbar). Try not to let your shoulders lift off the floor. Hold this position for __________ seconds. Tense your abdominal muscles and slowly move your knees back to the starting position. Repeat this exercise on the other side of your body. Repeat __________ times. Complete this exercise __________ times a day. Single knee to chest Lie on your back on a firm bed or the floor with both legs straight. Bend one of your knees. Use your hands to move your knee up toward your chest until you feel a gentle stretch in your lower back and buttock. Hold your leg in  this position by holding on to the front of your knee. Keep your other leg as straight as possible. Hold this position for __________ seconds. Slowly return to the starting position. Repeat with your other leg. Repeat __________ times. Complete this exercise __________ times a day. Prone extension on elbows Lie on your abdomen on a firm bed or the floor (prone position). Prop yourself up on your elbows. Use your arms to help lift your chest up until you feel a gentle stretch in your abdomen and your lower back. This will place some of your body weight on your elbows. If this is uncomfortable, try stacking pillows under your chest. Your hips should stay down, against the surface that you are lying on. Keep your hip and back muscles relaxed. Hold this position for __________ seconds. Slowly relax your upper body and return to the starting position. Repeat __________ times. Complete this exercise __________ times a day. Strengthening exercises These exercises build strength and endurance in your back. Endurance is the ability to use your muscles for a long time, even after they get tired. Pelvic tilt This exercise strengthens the muscles that lie deep in the abdomen. Lie on your back on a firm bed or the floor with your legs extended. Bend your knees so they are pointing toward the ceiling and your feet are flat on the floor. Tighten your lower abdominal muscles to press your lower back against the floor. This motion will tilt  your pelvis so your tailbone points up toward the ceiling instead of pointing to your feet or the floor. To help with this exercise, you may place a small towel under your lower back and try to push your back into the towel. Hold this position for __________ seconds. Let your muscles relax completely before you repeat this exercise. Repeat __________ times. Complete this exercise __________ times a day. Alternating arm and leg raises Get on your hands and knees on a  firm surface. If you are on a hard floor, you may want to use padding, such as an exercise mat, to cushion your knees. Line up your arms and legs. Your hands should be directly below your shoulders, and your knees should be directly below your hips. Lift your left leg behind you. At the same time, raise your right arm and straighten it in front of you. Do not lift your leg higher than your hip. Do not lift your arm higher than your shoulder. Keep your abdominal and back muscles tight. Keep your hips facing the ground. Do not arch your back. Keep your balance carefully, and do not hold your breath. Hold this position for __________ seconds. Slowly return to the starting position. Repeat with your right leg and your left arm. Repeat __________ times. Complete this exercise __________ times a day. Abdominal set with straight leg raise Lie on your back on a firm bed or the floor. Bend one of your knees and keep your other leg straight. Tense your abdominal muscles and lift your straight leg up, 4-6 inches (10-15 cm) off the ground. Keep your abdominal muscles tight and hold this position for __________ seconds. Do not hold your breath. Do not arch your back. Keep it flat against the ground. Keep your abdominal muscles tense as you slowly lower your leg back to the starting position. Repeat with your other leg. Repeat __________ times. Complete this exercise __________ times a day. Single leg lower with bent knees Lie on your back on a firm bed or the floor. Tense your abdominal muscles and lift your feet off the floor, one foot at a time, so your knees and hips are bent in 90-degree angles (right angles). Your knees should be over your hips and your lower legs should be parallel to the floor. Keeping your abdominal muscles tense and your knee bent, slowly lower one of your legs so your toe touches the ground. Lift your leg back up to return to the starting position. Do not hold your  breath. Do not let your back arch. Keep your back flat against the ground. Repeat with your other leg. Repeat __________ times. Complete this exercise __________ times a day. Posture and body mechanics Good posture and healthy body mechanics can help to relieve stress in your body's tissues and joints. Body mechanics refers to the movements and positions of your body while you do your daily activities. Posture is part of body mechanics. Good posture means: Your spine is in its natural S-curve position (neutral). Your shoulders are pulled back slightly. Your head is not tipped forward (neutral). Follow these guidelines to improve your posture and body mechanics in your everyday activities. Standing A comparison showing good and bad posture while standing.  When standing, keep your spine neutral and your feet about hip-width apart. Keep a slight bend in your knees. Your ears, shoulders, and hips should line up. When you do a task in which you stand in one place for a long time, place one foot  up on a stable object that is 2-4 inches (5-10 cm) high, such as a footstool. This helps keep your spine neutral. Sitting A comparison showing good and bad posture while sitting at a desk.  When sitting, keep your spine neutral and keep your feet flat on the floor. Use a footrest, if necessary, and keep your thighs parallel to the floor. Avoid rounding your shoulders, and avoid tilting your head forward. When working at a desk or a computer, keep your desk at a height where your hands are slightly lower than your elbows. Slide your chair under your desk so you are close enough to maintain good posture. When working at a computer, place your monitor at a height where you are looking straight ahead and you do not have to tilt your head forward or downward to look at the screen. Resting When lying down and resting, avoid positions that are most painful for you. If you have pain with activities such as sitting,  bending, stooping, or squatting, lie in a position in which your body does not bend very much. For example, avoid curling up on your side with your arms and knees near your chest (fetal position). If you have pain with activities such as standing for a long time or reaching with your arms, lie with your spine in a neutral position and bend your knees slightly. Try the following positions: Lying on your side with a pillow between your knees. Lying on your back with a pillow under your knees. Lifting A comparison showing the right and wrong way to lift a heavy object.  When lifting objects, keep your feet at least shoulder-width apart and tighten your abdominal muscles. Bend your knees and hips and keep your spine neutral. It is important to lift using the strength of your legs, not your back. Do not lock your knees straight out. Always ask for help to lift heavy or awkward objects. This information is not intended to replace advice given to you by your health care provider. Make sure you discuss any questions you have with your health care provider. Document Revised: 02/10/2021 Document Reviewed: 02/10/2021 Elsevier Patient Education  [place].

## 2021-12-17 NOTE — Progress Notes (Signed)
Acute Office Visit  Subjective:    Patient ID: Ruth Bishop, female    DOB: 09-09-1946, 76 y.o.   MRN: [id number]  Chief Complaint  Patient presents with   Back Pain    low    HPI Patient is in today for low back pain. Started a few weeks ago after she lifted a heavy package, located in bilateral lumbar spine with radiation into buttocks but no sciatica. Tried heating pad, hot showers, Voltaren, Biofreeze, Aleve, which all help minimally. Went back to gym Monday, usually goes 3x a week. Helped to to go to gym, tries to stretch well.  BACK PAIN Duration:  since 12/23 Mechanism of injury: lifting, lifted a heavy package Location: bilateral and low back Onset: sudden Severity: moderate, 4-5/10 Quality: dull and aching Frequency: constant Radiation: buttocks Aggravating factors: lifting, walking, and bending Alleviating factors: rest, heat, and NSAIDs Status: better Treatments attempted: rest, heat, and ibuprofen  Relief with NSAIDs?: moderate Nighttime pain:   did in beginning but better now  Paresthesias / decreased sensation:  no Bowel / bladder incontinence:  no Fevers:  no Dysuria / urinary frequency:  no  Also having some left ear discomfort and decreased hearing, states it "pops" a lot. No ear pain, drainage. No issues with right ear. Saw ENT and currently seeing audiologist.   Past Medical History:  Diagnosis Date   Abnormal chest x-ray 10/18/2018   Arthritis    Chronic pain 7/98/9211   Diastolic dysfunction 9/41/7408   GERD (gastroesophageal reflux disease)    Heart murmur    Hormone replacement therapy 05/26/2016   Hypertension    Incidental lung nodule, > 13mm and < 64mm 08/04/2017   4 mm ground glass nodule LLL incidentally noted on CT scan Nov 2016   Osteoarthritis of both knees 06/13/2015   [facility]    Post menopausal syndrome 05/24/2015   Skin-picking disorder 05/24/2015   when nervous/stressed chews on fingers and peels down skin, started with  cuticles but now hypopigmentation of fingers down to palm in some areas with lifetime of skin picking.     Past Surgical History:  Procedure Laterality Date   ABDOMINAL HYSTERECTOMY     COLONOSCOPY WITH PROPOFOL N/A 10/01/2015   Procedure: COLONOSCOPY WITH PROPOFOL;  Surgeon: Lollie Sails, MD;  Location: [facility] ENDOSCOPY;  Service: Endoscopy;  Laterality: N/A;   ESOPHAGOGASTRODUODENOSCOPY (EGD) WITH PROPOFOL N/A 10/01/2015   Procedure: ESOPHAGOGASTRODUODENOSCOPY (EGD) WITH PROPOFOL;  Surgeon: Lollie Sails, MD;  Location: [facility] ENDOSCOPY;  Service: Endoscopy;  Laterality: N/A;    Family History  Problem Relation Age of Onset   Heart disease Mother    Diabetes Mother    Aneurysm Mother    Heart disease Father    Heart attack Father    Heart disease Sister    Diabetes Sister    Pneumonia Sister    Heart disease Brother    Diabetes Brother    Diabetes Sister    Heart disease Sister    Congestive Heart Failure Sister    Breast cancer Neg Hx     Social History   Socioeconomic History   Marital status: Widowed    Spouse name: Not on file   Number of children: 1   Years of education: Not on file   Highest education level: Master's degree (e.g., MA, MS, MEng, MEd, MSW, MBA)  Occupational History   Occupation: Retired  Tobacco Use   Smoking status: Never   Smokeless tobacco: Never   Tobacco comments:  smoking cessation materials not required  Vaping Use   Vaping Use: Never used  Substance and Sexual Activity   Alcohol use: Not Currently    Alcohol/week: 0.0 standard drinks   Drug use: No   Sexual activity: Not Currently  Other Topics Concern   Not on file  Social History Narrative   Pt lives alone   Social Determinants of Health   Financial Resource Strain: Low Risk    Difficulty of Paying Living Expenses: Not hard at all  Food Insecurity: No Food Insecurity   Worried About Charity fundraiser in the Last Year: Never true   Ran Out of Food in the Last  Year: Never true  Transportation Needs: No Transportation Needs   Lack of Transportation (Medical): No   Lack of Transportation (Non-Medical): No  Physical Activity: Sufficiently Active   Days of Exercise per Week: 4 days   Minutes of Exercise per Session: 90 min  Stress: No Stress Concern Present   Feeling of Stress : Not at all  Social Connections: Moderately Integrated   Frequency of Communication with Friends and Family: More than three times a week   Frequency of Social Gatherings with Friends and Family: More than three times a week   Attends Religious Services: More than 4 times per year   Active Member of [employer] or Organizations: Yes   Attends Archivist Meetings: More than 4 times per year   Marital Status: Widowed  Human resources officer Violence: Not At Risk   Fear of Current or Ex-Partner: No   Emotionally Abused: No   Physically Abused: No   Sexually Abused: No    Outpatient Medications Prior to Visit  Medication Sig Dispense Refill   aspirin EC 81 MG tablet Take 81 mg by mouth daily.     Calcium Carbonate-Vitamin D 600-400 MG-UNIT tablet Take 1 tablet by mouth daily.      clobetasol cream (TEMOVATE) 0.05 % SMARTSIG:1 Sparingly Topical Every Night PRN     docusate sodium (COLACE) 250 MG capsule Take 250 mg by mouth daily.     famotidine (PEPCID) 20 MG tablet Take 1 tablet (20 mg total) by mouth 2 (two) times daily as needed for heartburn or indigestion. 60 tablet 2   losartan (COZAAR) 50 MG tablet Take 1 tablet (50 mg total) by mouth daily. 90 tablet 3   MULTIPLE VITAMIN PO Take 1 tablet by mouth daily.      pantoprazole (PROTONIX) 40 MG tablet Take 1 tablet (40 mg total) by mouth daily as needed. 30 tablet 1   psyllium (METAMUCIL) 0.52 g capsule Take 0.52 g by mouth daily.     TRI-LUMA 0.01-4-0.05 % CREA SMARTSIG:1 Sparingly Topical Every Night     triamcinolone (KENALOG) 0.1 % Apply 1 application topically 2 (two) times daily. If needed 45 g 1   TURMERIC PO Take  1 tablet by mouth daily.      No facility-administered medications prior to visit.    Allergies  Allergen Reactions   Nitrofurantoin Monohyd Macro     Review of Systems  Constitutional:  Negative for chills and fever.  Musculoskeletal:  Positive for back pain. Negative for gait problem.  Skin: Negative.   Neurological:  Negative for weakness and numbness.      Objective:    Physical Exam Constitutional:      Appearance: Normal appearance.  HENT:     Head: Normocephalic and atraumatic.     Right Ear: Tympanic membrane, ear canal and external  ear normal.     Left Ear: Tympanic membrane, ear canal and external ear normal.  Eyes:     Conjunctiva/sclera: Conjunctivae normal.  Cardiovascular:     Rate and Rhythm: Normal rate and regular rhythm.  Pulmonary:     Effort: Pulmonary effort is normal.     Breath sounds: Normal breath sounds.  Musculoskeletal:        General: Tenderness present. No swelling. Normal range of motion.     Right lower leg: No edema.     Left lower leg: No edema.     Comments: Pain over L4-5 bilaterally, worse with flexion and bilateral rotation. Straight leg test negative, tight hamstrings bilaterally.   Skin:    General: Skin is warm and dry.  Neurological:     General: No focal deficit present.     Mental Status: She is alert. Mental status is at baseline.  Psychiatric:        Mood and Affect: Mood normal.        Behavior: Behavior normal.    BP 136/80    Pulse 73    Temp 97.8 F (36.6 C)    Resp 16    Ht 5\' 10"  (1.778 m)    Wt 214 lb 11.2 oz (97.4 kg)    SpO2 98%    BMI 30.81 kg/m  Wt Readings from Last 3 Encounters:  12/17/21 214 lb 11.2 oz (97.4 kg)  10/21/21 213 lb 11.2 oz (96.9 kg)  04/03/21 217 lb 1.6 oz (98.5 kg)    Health Maintenance Due  Topic Date Due   INFLUENZA VACCINE  07/07/2021   COVID-19 Vaccine (5 - Booster for Pfizer series) 10/21/2021    There are no preventive care reminders to display for this patient.   Lab  Results  Component Value Date   TSH 1.05 02/04/2018   Lab Results  Component Value Date   WBC 5.5 04/03/2021   HGB 13.5 04/03/2021   HCT 42.3 04/03/2021   MCV 82.3 04/03/2021   PLT 272 04/03/2021   Lab Results  Component Value Date   NA 141 04/03/2021   K 4.2 04/03/2021   CO2 26 04/03/2021   GLUCOSE 92 04/03/2021   BUN 17 04/03/2021   CREATININE 0.77 04/03/2021   BILITOT 0.5 04/03/2021   ALKPHOS 108 08/04/2017   AST 16 04/03/2021   ALT 14 04/03/2021   PROT 6.9 04/03/2021   ALBUMIN 3.8 08/04/2017   CALCIUM 9.3 04/03/2021   ANIONGAP 8 06/28/2019   Lab Results  Component Value Date   CHOL 160 04/03/2021   Lab Results  Component Value Date   HDL 55 04/03/2021   Lab Results  Component Value Date   LDLCALC 80 04/03/2021   Lab Results  Component Value Date   TRIG 149 04/03/2021   Lab Results  Component Value Date   CHOLHDL 2.9 04/03/2021   Lab Results  Component Value Date   HGBA1C 5.6 04/03/2021       Assessment & Plan:   1. Strain of lumbar region, initial encounter: Definitely muscular in nature with hypertonic lumbar paraspinal muscles. Discussed the importance of stretching, using moist heat and anti-inflammatories as needed. Low dose muscle relaxer sent to pharmacy as well. FU as needed.  - tiZANidine (ZANAFLEX) 2 MG tablet; Take 1 tablet (2 mg total) by mouth every 8 (eight) hours as needed for muscle spasms.  Dispense: 30 tablet; Refill: 0  2. Acute dysfunction of left eustachian tube: Recommend nasal steroid to try, continue  to follow with Audiology.   - fluticasone (FLONASE) 50 MCG/ACT nasal spray; Place 2 sprays into both nostrils daily.  Dispense: 16 g; Refill: [place], DO

## 2021-12-22 ENCOUNTER — Other Ambulatory Visit: Payer: Self-pay | Admitting: Family Medicine

## 2021-12-22 NOTE — Telephone Encounter (Signed)
Pt called saying she talked to [place] and they told her they tried to contact the office about her refill on Losartan 50 mg.  She said he is needing a refill and they told her to contact the office.    Their # [phone]

## 2021-12-22 NOTE — Telephone Encounter (Signed)
Left message for patient to call back  

## 2021-12-22 NOTE — Telephone Encounter (Signed)
Call to patient- need to verify which mail order she is using- she has 2 listed- original Rx-10/21/21 went to [facility]- need to verify [facility] is current

## 2021-12-22 NOTE — Telephone Encounter (Signed)
Requested medication (s) are due for refill today: no  Requested medication (s) are on the active medication list: yes  Last refill:  10/21/21 #90/3  Future visit scheduled: no  Notes to clinic:  pt is sending to different pharmacy but labs are needing updated. Please advise.     Requested Prescriptions  Pending Prescriptions Disp Refills   losartan (COZAAR) 50 MG tablet 90 tablet 3    Sig: Take 1 tablet (50 mg total) by mouth daily.     Cardiovascular:  Angiotensin Receptor Blockers Failed - 12/22/2021  2:26 PM      Failed - Cr in normal range and within 180 days    Creat  Date Value Ref Range Status  04/03/2021 0.77 0.60 - 0.93 mg/dL Final    Comment:    For patients >21 years of age, the reference limit for Creatinine is approximately 13% higher for people identified as African-American. .           Failed - K in normal range and within 180 days    Potassium  Date Value Ref Range Status  04/03/2021 4.2 3.5 - 5.3 mmol/L Final          Passed - Patient is not pregnant      Passed - Last BP in normal range    BP Readings from Last 1 Encounters:  12/17/21 136/80          Passed - Valid encounter within last 6 months    Recent Outpatient Visits           5 days ago Strain of lumbar region, initial encounter   [place], DO   2 months ago Urinary frequency   [place], DO   8 months ago Annual physical exam   [facility] Delsa Grana, PA-C   1 year ago Gastroesophageal reflux disease, unspecified whether esophagitis present   [facility] Delsa Grana, PA-C   1 year ago Abdominal pain, unspecified abdominal location   [place], MD       Future Appointments             In 10 months [facility]

## 2021-12-23 MED ORDER — LOSARTAN POTASSIUM 50 MG PO TABS: 50.0000 mg | ORAL_TABLET | Freq: Every day | ORAL | 0 refills | Status: DC

## 2021-12-24 NOTE — Telephone Encounter (Signed)
Appt scheduled with Dr Rosana Berger for April

## 2022-01-21 IMAGING — MG DIGITAL SCREENING BILAT W/ TOMO W/ CAD
8 series · 8 of 24 positions shown · non-contrast
Comparison: Previous exam(s).

CLINICAL DATA: Screening.

EXAM:
DIGITAL SCREENING BILATERAL MAMMOGRAM WITH TOMO AND CAD

[L CC synth-2D]
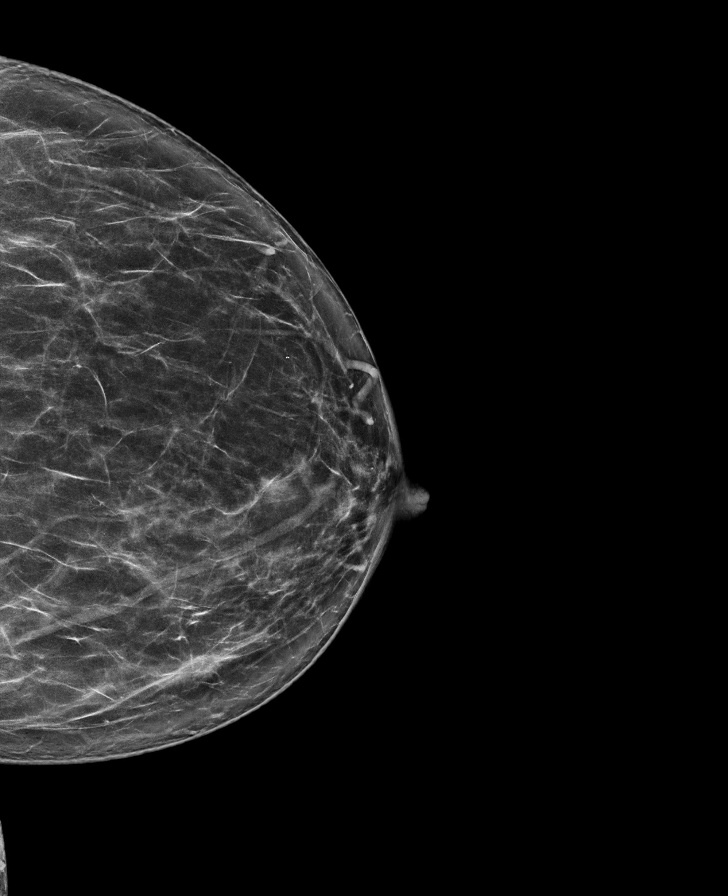

[R CC synth-2D]
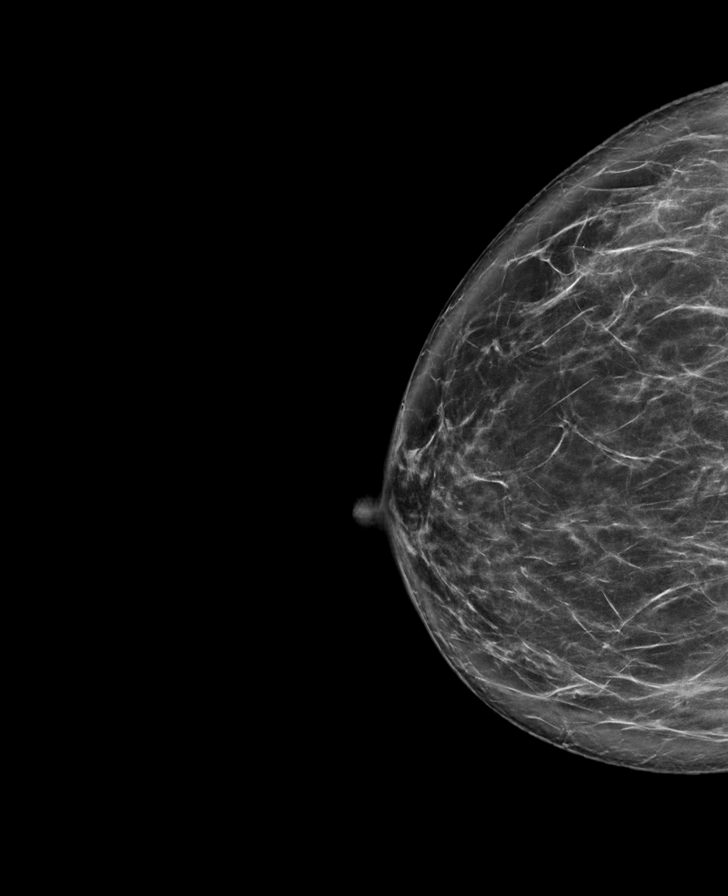

[L MLO synth-2D]
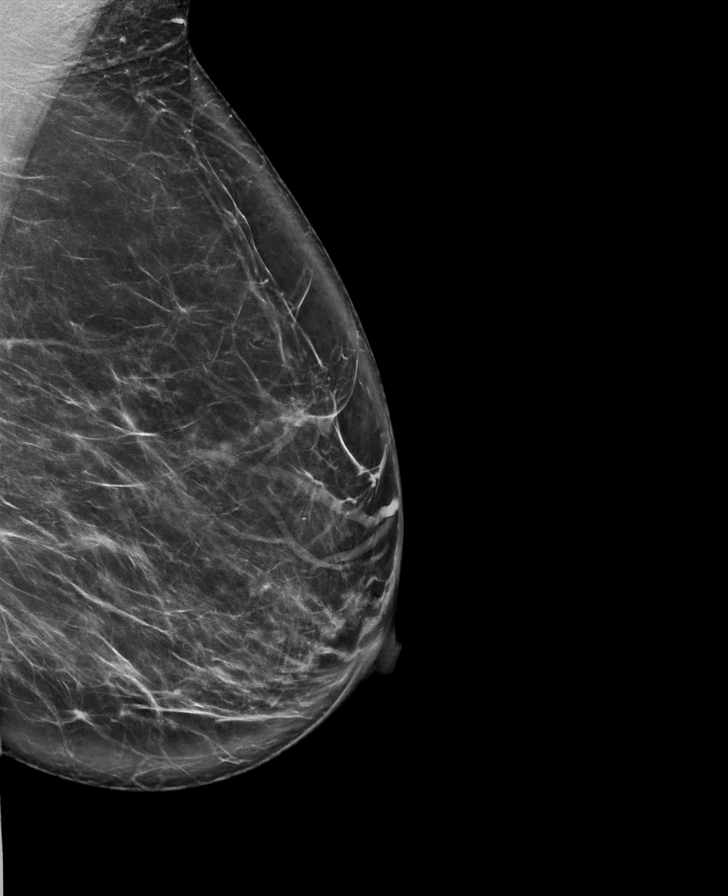

[R MLO synth-2D]
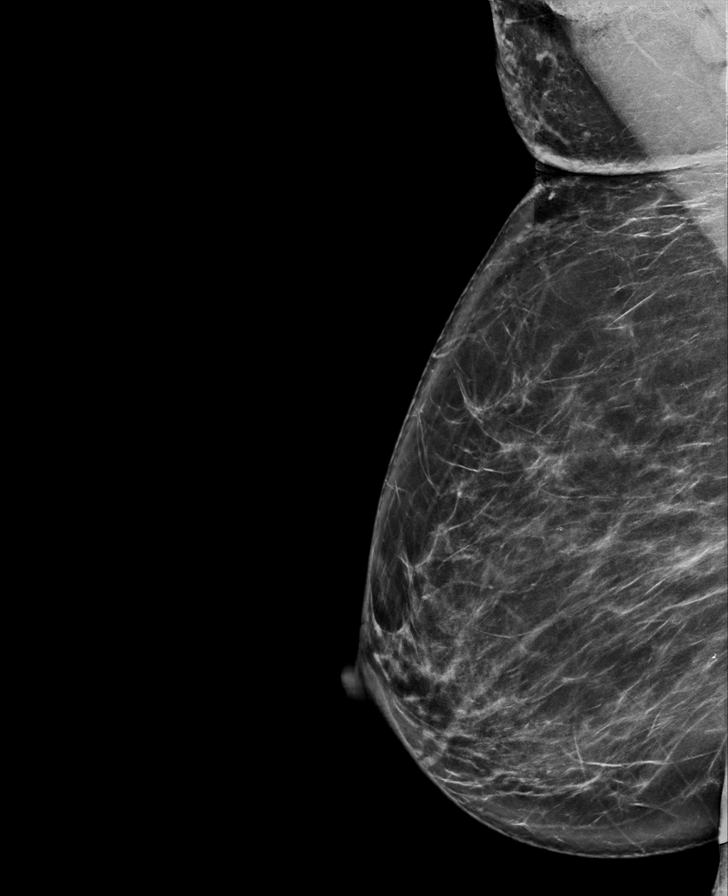

[R CC tomo · tomo slice 47/92.0]
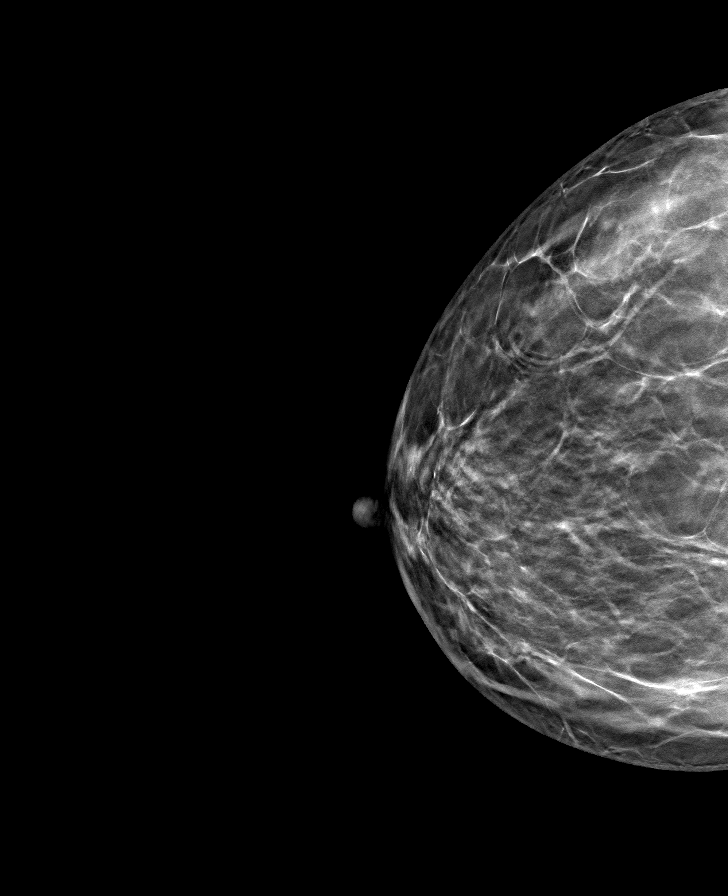

[L CC tomo · tomo slice 40/79.0]
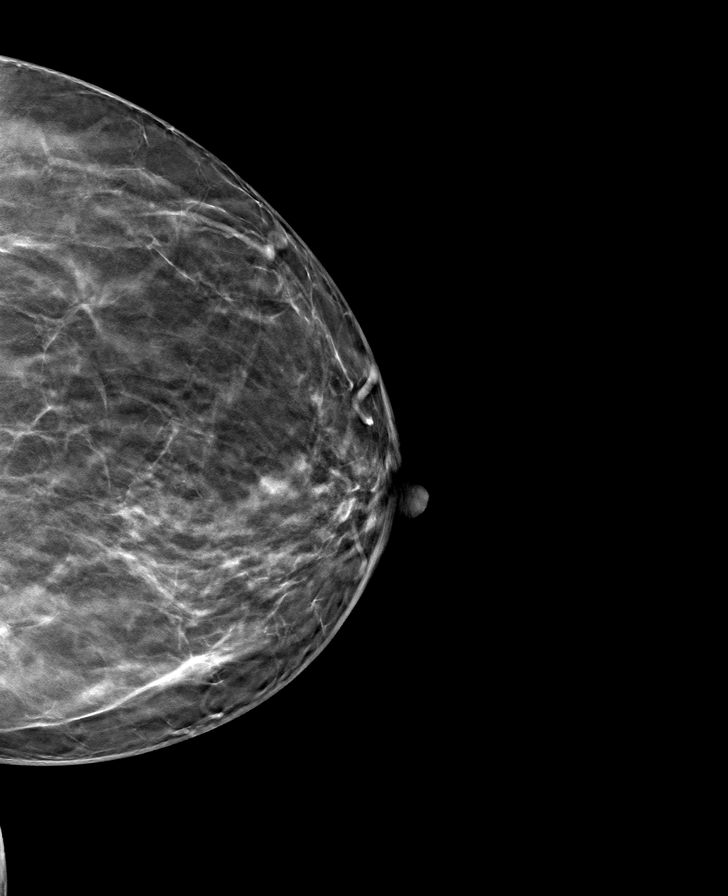

[R MLO tomo · tomo slice 45/90.0]
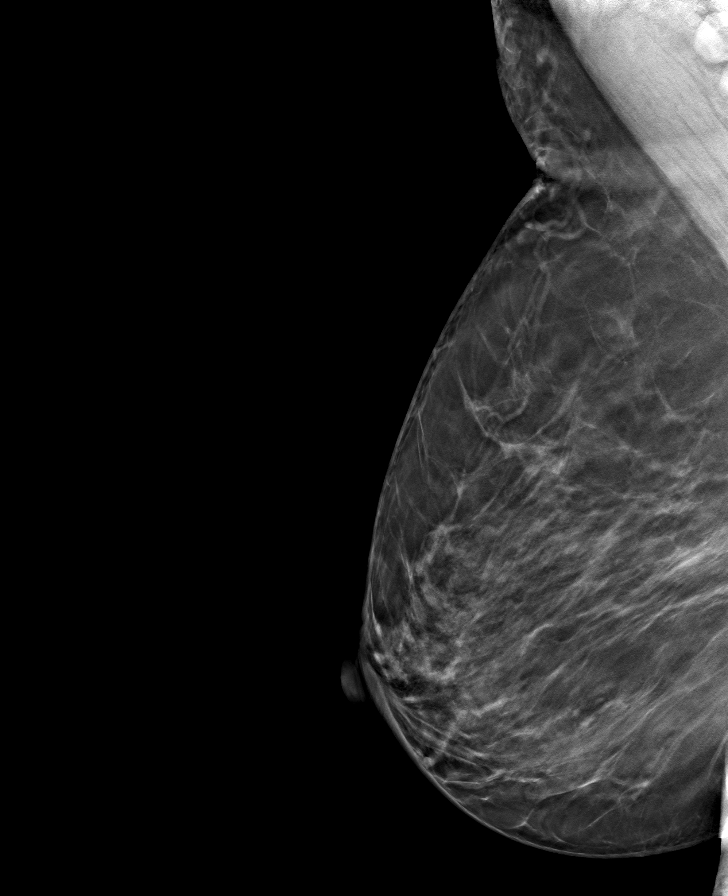

[L MLO tomo · tomo slice 49/97.0]
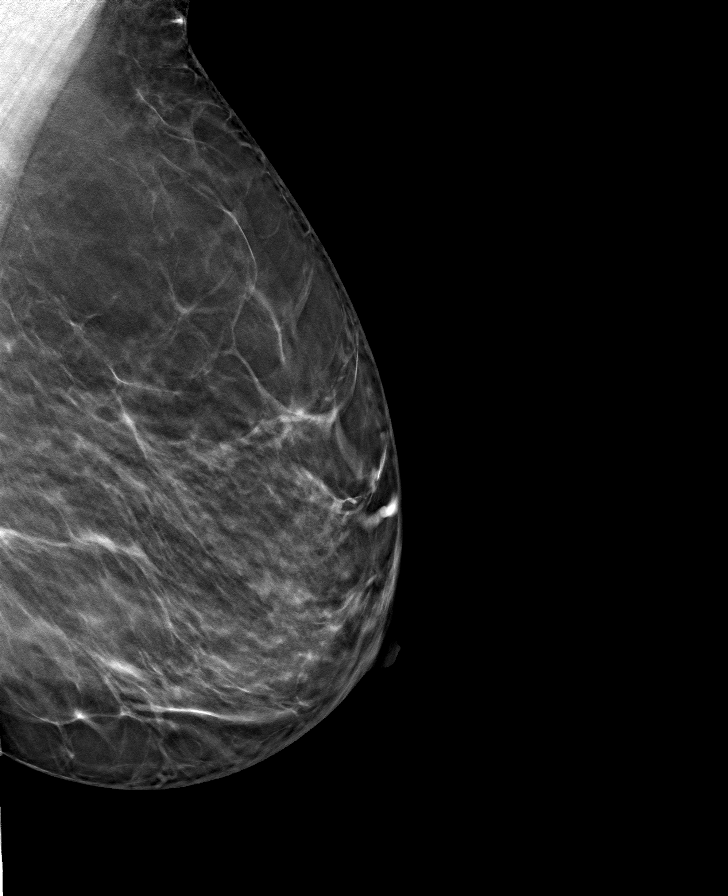

[8 of 24 positions shown; findings below may reference images not displayed]

ACR Breast Density Category b: There are scattered areas of
fibroglandular density.
FINDINGS: There are no findings suspicious for malignancy. Images were
processed with CAD.
IMPRESSION: No mammographic evidence of malignancy. A result letter of this
screening mammogram will be mailed directly to the patient.

RECOMMENDATION:
Screening mammogram in one year. (Code:[id number])

BI-RADS CATEGORY  1: Negative.

## 2022-02-13 ENCOUNTER — Other Ambulatory Visit: Payer: Self-pay

## 2022-02-13 ENCOUNTER — Ambulatory Visit (INDEPENDENT_AMBULATORY_CARE_PROVIDER_SITE_OTHER): Payer: Medicare PPO | Admitting: Internal Medicine

## 2022-02-13 VITALS — BP 122/84 | HR 76 | Temp 98.4°F | Resp 16 | Ht 70.0 in | Wt 215.9 lb

## 2022-02-13 DIAGNOSIS — H6122 Impacted cerumen, left ear: Secondary | ICD-10-CM | POA: Diagnosis not present

## 2022-02-13 DIAGNOSIS — L309 Dermatitis, unspecified: Secondary | ICD-10-CM

## 2022-02-13 MED ORDER — TRIAMCINOLONE ACETONIDE 0.1 % EX CREA: 1.0000 "application " | TOPICAL_CREAM | Freq: Two times a day (BID) | CUTANEOUS | 1 refills | Status: DC

## 2022-02-13 NOTE — Patient Instructions (Addendum)
It was great seeing you today! ? ?Plan discussed at today's visit: ?-Can continue Debrox to keep wax soft and from building up ?-Continue Flonase as well ? ?Follow up in: keep follow up in April  ? ?Take care and let us know if you have any questions or concerns prior to your next visit. ? ?Dr. Rosana Berger ? ?Earwax Buildup, Adult ?The ears produce a substance called earwax that helps keep bacteria out of the ear and protects the skin in the ear canal. Occasionally, earwax can build up in the ear and cause discomfort or hearing loss. ?What are the causes? ?This condition is caused by a buildup of earwax. Ear canals are self-cleaning. Ear wax is made in the outer part of the ear canal and generally falls out in small amounts over time. ?When the self-cleaning mechanism is not working, earwax builds up and can cause decreased hearing and discomfort. Attempting to clean ears with cotton swabs can push the earwax deep into the ear canal and cause decreased hearing and pain. ?What increases the risk? ?This condition is more likely to develop in people who: ?Clean their ears often with cotton swabs. ?Pick at their ears. ?Use earplugs or in-ear headphones often, or wear hearing aids. ?The following factors may also make you more likely to develop this condition: ?Being female. ?Being of older age. ?Naturally producing more earwax. ?Having narrow ear canals. ?Having earwax that is overly thick or sticky. ?Having excess hair in the ear canal. ?Having eczema. ?Being dehydrated. ?What are the signs or symptoms? ?Symptoms of this condition include: ?Reduced or muffled hearing. ?A feeling of fullness in the ear or feeling that the ear is plugged. ?Fluid coming from the ear. ?Ear pain or an itchy ear. ?Ringing in the ear. ?Coughing. ?Balance problems. ?An obvious piece of earwax that can be seen inside the ear canal. ?How is this diagnosed? ?This condition may be diagnosed based on: ?Your symptoms. ?Your medical history. ?An ear exam.  During the exam, your health care provider will look into your ear with an instrument called an otoscope. ?You may have tests, including a hearing test. ?How is this treated? ?This condition may be treated by: ?Using ear drops to soften the earwax. ?Having the earwax removed by a health care provider. The health care provider may: ?Flush the ear with water. ?Use an instrument that has a loop on the end (curette). ?Use a suction device. ?Having surgery to remove the wax buildup. This may be done in severe cases. ?Follow these instructions at home: ?Take over-the-counter and prescription medicines only as told by your health care provider. ?Do not put any objects, including cotton swabs, into your ear. You can clean the opening of your ear canal with a washcloth or facial tissue. ?Follow instructions from your health care provider about cleaning your ears. Do not overclean your ears. ?Drink enough fluid to keep your urine pale yellow. This will help to thin the earwax. ?Keep all follow-up visits as told. If earwax builds up in your ears often or if you use hearing aids, consider seeing your health care provider for routine, preventive ear cleanings. Ask your health care provider how often you should schedule your cleanings. ?If you have hearing aids, clean them according to instructions from the manufacturer and your health care provider. ?Contact a health care provider if: ?You have ear pain. ?You develop a fever. ?You have pus or other fluid coming from your ear. ?You have hearing loss. ?You have ringing in your ears  that does not go away. ?You feel like the room is spinning (vertigo). ?Your symptoms do not improve with treatment. ?Get help right away if: ?You have bleeding from the affected ear. ?You have severe ear pain. ?Summary ?Earwax can build up in the ear and cause discomfort or hearing loss. ?The most common symptoms of this condition include reduced or muffled hearing, a feeling of fullness in the ear, or  feeling that the ear is plugged. ?This condition may be diagnosed based on your symptoms, your medical history, and an ear exam. ?This condition may be treated by using ear drops to soften the earwax or by having the earwax removed by a health care provider. ?Do not put any objects, including cotton swabs, into your ear. You can clean the opening of your ear canal with a washcloth or facial tissue. ?This information is not intended to replace advice given to you by your health care provider. Make sure you discuss any questions you have with your health care provider. ?Document Revised: 03/12/2020 Document Reviewed: 03/12/2020 ?Elsevier Patient Education ? [place]. ?  ?

## 2022-02-13 NOTE — Addendum Note (Signed)
Addended by: Docia Furl on: 02/13/2022 01:35 PM ? ? Modules accepted: Orders ? ?

## 2022-02-13 NOTE — Progress Notes (Signed)
Acute Office Visit  Subjective:    Patient ID: Ruth Bishop, female    DOB: November 14, 1946, 76 y.o.   MRN: [id number]  Chief Complaint  Patient presents with   Ear Fullness    Left ear     HPI Patient is in today for cerumen impaction. Went for hearing evaluation last week but the test could not be performed due to bilateral cerumen impaction. Had bene using spiral device to remove ear wax, using Debrox drops for the last 3 days as well. Pain behind ear on the left, hearing changes, popping in sensation worse. Chewing gums and using Flonase. No drainage from the ears, no tinnitus, no fevers. Had a bad cold last week, had runny nose, sore throat, and cough but these symptoms have resolved. Used Albuterol inhaler which helped symptoms.   Past Medical History:  Diagnosis Date   Abnormal chest x-ray 10/18/2018   Arthritis    Chronic pain 8/92/1194   Diastolic dysfunction 1/74/0814   GERD (gastroesophageal reflux disease)    Heart murmur    Hormone replacement therapy 05/26/2016   Hypertension    Incidental lung nodule, > 58m and < 849m8/29/2018   4 mm ground glass nodule LLL incidentally noted on CT scan Nov 2016   Osteoarthritis of both knees 06/13/2015   [facility]    Post menopausal syndrome 05/24/2015   Skin-picking disorder 05/24/2015   when nervous/stressed chews on fingers and peels down skin, started with cuticles but now hypopigmentation of fingers down to palm in some areas with lifetime of skin picking.     Past Surgical History:  Procedure Laterality Date   ABDOMINAL HYSTERECTOMY     COLONOSCOPY WITH PROPOFOL N/A 10/01/2015   Procedure: COLONOSCOPY WITH PROPOFOL;  Surgeon: MaLollie SailsMD;  [facility];  Service: Endoscopy;  Laterality: N/A;   ESOPHAGOGASTRODUODENOSCOPY (EGD) WITH PROPOFOL N/A 10/01/2015   Procedure: ESOPHAGOGASTRODUODENOSCOPY (EGD) WITH PROPOFOL;  Surgeon: MaLollie SailsMD;  [facility];  Service: Endoscopy;   Laterality: N/A;    Family History  Problem Relation Age of Onset   Heart disease Mother    Diabetes Mother    Aneurysm Mother    Heart disease Father    Heart attack Father    Heart disease Sister    Diabetes Sister    Pneumonia Sister    Heart disease Brother    Diabetes Brother    Diabetes Sister    Heart disease Sister    Congestive Heart Failure Sister    Breast cancer Neg Hx     Social History   Socioeconomic History   Marital status: Widowed    Spouse name: Not on file   Number of children: 1   Years of education: Not on file   Highest education level: Master's degree (e.g., MA, MS, MEng, MEd, MSW, MBA)  Occupational History   Occupation: Retired  Tobacco Use   Smoking status: Never   Smokeless tobacco: Never   Tobacco comments:    smoking cessation materials not required  Vaping Use   Vaping Use: Never used  Substance and Sexual Activity   Alcohol use: Not Currently    Alcohol/week: 0.0 standard drinks   Drug use: No   Sexual activity: Not Currently  Other Topics Concern   Not on file  Social History Narrative   Pt lives alone   Social Determinants of Health   Financial Resource Strain: Low Risk    Difficulty of Paying Living Expenses: Not hard at  all  Food Insecurity: No Food Insecurity   Worried About Charity fundraiser in the Last Year: Never true   Ran Out of Food in the Last Year: Never true  Transportation Needs: No Transportation Needs   Lack of Transportation (Medical): No   Lack of Transportation (Non-Medical): No  Physical Activity: Sufficiently Active   Days of Exercise per Week: 4 days   Minutes of Exercise per Session: 90 min  Stress: No Stress Concern Present   Feeling of Stress : Not at all  Social Connections: Moderately Integrated   Frequency of Communication with Friends and Family: More than three times a week   Frequency of Social Gatherings with Friends and Family: More than three times a week   Attends Religious  Services: More than 4 times per year   Active Member of [employer] or Organizations: Yes   Attends Archivist Meetings: More than 4 times per year   Marital Status: Widowed  Human resources officer Violence: Not At Risk   Fear of Current or Ex-Partner: No   Emotionally Abused: No   Physically Abused: No   Sexually Abused: No    Outpatient Medications Prior to Visit  Medication Sig Dispense Refill   aspirin EC 81 MG tablet Take 81 mg by mouth daily.     Calcium Carbonate-Vitamin D 600-400 MG-UNIT tablet Take 1 tablet by mouth daily.      clobetasol cream (TEMOVATE) 0.05 % SMARTSIG:1 Sparingly Topical Every Night PRN     docusate sodium (COLACE) 250 MG capsule Take 250 mg by mouth daily.     famotidine (PEPCID) 20 MG tablet Take 1 tablet (20 mg total) by mouth 2 (two) times daily as needed for heartburn or indigestion. 60 tablet 2   fluticasone (FLONASE) 50 MCG/ACT nasal spray Place 2 sprays into both nostrils daily. 16 g 6   losartan (COZAAR) 50 MG tablet Take 1 tablet (50 mg total) by mouth daily. 90 tablet 0   MULTIPLE VITAMIN PO Take 1 tablet by mouth daily.      pantoprazole (PROTONIX) 40 MG tablet Take 1 tablet (40 mg total) by mouth daily as needed. 30 tablet 1   psyllium (METAMUCIL) 0.52 g capsule Take 0.52 g by mouth daily.     tiZANidine (ZANAFLEX) 2 MG tablet Take 1 tablet (2 mg total) by mouth every 8 (eight) hours as needed for muscle spasms. 30 tablet 0   TRI-LUMA 0.01-4-0.05 % CREA SMARTSIG:1 Sparingly Topical Every Night     triamcinolone (KENALOG) 0.1 % Apply 1 application topically 2 (two) times daily. If needed 45 g 1   TURMERIC PO Take 1 tablet by mouth daily.      No facility-administered medications prior to visit.    Allergies  Allergen Reactions   Nitrofurantoin Monohyd Macro     Review of Systems  Constitutional:  Negative for chills and fever.  HENT:  Positive for ear pain and hearing loss. Negative for congestion, ear discharge, rhinorrhea, sore throat and  tinnitus.   Respiratory:  Negative for cough and wheezing.   Cardiovascular:  Negative for chest pain.  Neurological:  Negative for light-headedness and headaches.      Objective:    Physical Exam Constitutional:      Appearance: Normal appearance.  HENT:     Head: Normocephalic and atraumatic.     Right Ear: Tympanic membrane, ear canal and external ear normal.     Left Ear: Tympanic membrane, ear canal and external ear normal.  Nose: Nose normal.     Mouth/Throat:     Mouth: Mucous membranes are dry.  Eyes:     Conjunctiva/sclera: Conjunctivae normal.  Cardiovascular:     Rate and Rhythm: Normal rate and regular rhythm.  Pulmonary:     Effort: Pulmonary effort is normal.     Breath sounds: Normal breath sounds.  Neurological:     General: No focal deficit present.     Mental Status: She is alert. Mental status is at baseline.  Psychiatric:        Mood and Affect: Mood normal.        Behavior: Behavior normal.    There were no vitals taken for this visit. Wt Readings from Last 3 Encounters:  12/17/21 214 lb 11.2 oz (97.4 kg)  10/21/21 213 lb 11.2 oz (96.9 kg)  04/03/21 217 lb 1.6 oz (98.5 kg)    Health Maintenance Due  Topic Date Due   INFLUENZA VACCINE  07/07/2021   COVID-19 Vaccine (5 - Booster for Pfizer series) 10/21/2021    There are no preventive care reminders to display for this patient.   Lab Results  Component Value Date   TSH 1.05 02/04/2018   Lab Results  Component Value Date   WBC 5.5 04/03/2021   HGB 13.5 04/03/2021   HCT 42.3 04/03/2021   MCV 82.3 04/03/2021   PLT 272 04/03/2021   Lab Results  Component Value Date   NA 141 04/03/2021   K 4.2 04/03/2021   CO2 26 04/03/2021   GLUCOSE 92 04/03/2021   BUN 17 04/03/2021   CREATININE 0.77 04/03/2021   BILITOT 0.5 04/03/2021   ALKPHOS 108 08/04/2017   AST 16 04/03/2021   ALT 14 04/03/2021   PROT 6.9 04/03/2021   ALBUMIN 3.8 08/04/2017   CALCIUM 9.3 04/03/2021   ANIONGAP 8  06/28/2019   Lab Results  Component Value Date   CHOL 160 04/03/2021   Lab Results  Component Value Date   HDL 55 04/03/2021   Lab Results  Component Value Date   LDLCALC 80 04/03/2021   Lab Results  Component Value Date   TRIG 149 04/03/2021   Lab Results  Component Value Date   CHOLHDL 2.9 04/03/2021   Lab Results  Component Value Date   HGBA1C 5.6 04/03/2021       Assessment & Plan:   1. Impacted cerumen of left ear: Ear wax removed today, continue Debrox drops and Flonase. Will follow up with audiology for hearing test. Follow up scheduled for next month.   2. Chronic dermatitis of hands: Steroid cream refilled.  - triamcinolone cream (KENALOG) 0.1 %; Apply 1 application. topically 2 (two) times daily. If needed  Dispense: 45 g; Refill: Maud, DO

## 2022-03-03 ENCOUNTER — Telehealth: Payer: Self-pay

## 2022-03-03 DIAGNOSIS — L309 Dermatitis, unspecified: Secondary | ICD-10-CM

## 2022-03-03 MED ORDER — TRIAMCINOLONE ACETONIDE 0.5 % EX OINT: 1.0000 "application " | TOPICAL_OINTMENT | Freq: Two times a day (BID) | CUTANEOUS | 0 refills | Status: DC

## 2022-03-03 NOTE — Telephone Encounter (Signed)
Pt wants 5% cream I told her it may need prior auth ?

## 2022-03-03 NOTE — Telephone Encounter (Signed)
Copied from [place] [phone] [phone]. Topic: General - Inquiry >> Mar 03, 2022  8:39 AM Greggory Keen D wrote: Reason for CRM: t called saying she was supposed to be getting .05 % of the Triamcinolone cream instead of the .01.  She uses [facility]. [employer]

## 2022-03-03 NOTE — Telephone Encounter (Deleted)
Pt states wants the 5 [place] Progression ?Recent Vital Signs   ?'@VS'$ @ ?  ?Past Medical History:  ?Diagnosis Date  ? Abnormal chest x-ray 10/18/2018  ? Arthritis   ? Chronic pain 05/24/2015  ? Diastolic dysfunction 1/55/2080  ? GERD (gastroesophageal reflux disease)   ? Heart murmur   ? Hormone replacement therapy 05/26/2016  ? Hypertension   ? Incidental lung nodule, > 61m and < 816m8/29/2018  ? 4 mm ground glass nodule LLL incidentally noted on CT scan Nov 2016  ? Osteoarthritis of both knees 06/13/2015  ? [facility]   ? Post menopausal syndrome 05/24/2015  ? Skin-picking disorder 05/24/2015  ? when nervous/stressed chews on fingers and peels down skin, started with cuticles but now hypopigmentation of fingers down to palm in some areas with lifetime of skin picking.   ?  ? ?Expected Discharge Date  ?'@FLOW'$ (2(223361::2)@ ?Diet Order   ? ? None  ? ?  ?  ? ?VTE Documentation  ?'@FLOW'$ (7060410::1)@  ? ?Work Intensity Score/Level of Care  ?'@FLOW'$ (10536::1)@ ? ?'@LEVELOFCARE'$ @ ? ? ?Mobility  ?'@FLOW'$ (7060220::1)@ ? ?  ? ?Significant Events  ? ? ?DC Barriers  ? ?Abnormal Labs: ? ?[name]3/28/2023, 2:13 PM ?

## 2022-03-03 NOTE — Addendum Note (Signed)
Addended by: Teodora Medici on: 03/03/2022 03:47 PM ? ? Modules accepted: Orders ? ?

## 2022-03-16 ENCOUNTER — Telehealth: Payer: Self-pay

## 2022-03-16 NOTE — Telephone Encounter (Signed)
Sent message to billing department to have the date of service of 02/13/2022 resubmitted to insurance company as Primary Care and not speciality.  Please allow 30- 45 days for reprocessing. Patient has been notified and verbalized understanding.

## 2022-03-16 NOTE — Telephone Encounter (Signed)
Copied from [place] [phone]. Topic: General - Other >> Mar 16, 2022  9:29 AM Rayann Heman wrote: Reason for CRM: Pt called and stated that she seen Dr Rosana Berger and Insurance is billing her as a specialist. Pt would like to know what she can do about this.  Patient is worried to come in for next appointment. Please advise

## 2022-03-20 ENCOUNTER — Ambulatory Visit (INDEPENDENT_AMBULATORY_CARE_PROVIDER_SITE_OTHER): Payer: Medicare PPO | Admitting: Internal Medicine

## 2022-03-20 ENCOUNTER — Encounter: Payer: Self-pay | Admitting: Internal Medicine

## 2022-03-20 VITALS — BP 134/76 | HR 77 | Temp 97.9°F | Resp 16 | Ht 70.0 in | Wt 215.1 lb

## 2022-03-20 DIAGNOSIS — R7309 Other abnormal glucose: Secondary | ICD-10-CM

## 2022-03-20 DIAGNOSIS — E669 Obesity, unspecified: Secondary | ICD-10-CM | POA: Diagnosis not present

## 2022-03-20 DIAGNOSIS — H9191 Unspecified hearing loss, right ear: Secondary | ICD-10-CM | POA: Diagnosis not present

## 2022-03-20 DIAGNOSIS — L309 Dermatitis, unspecified: Secondary | ICD-10-CM | POA: Diagnosis not present

## 2022-03-20 DIAGNOSIS — I10 Essential (primary) hypertension: Secondary | ICD-10-CM

## 2022-03-20 DIAGNOSIS — E781 Pure hyperglyceridemia: Secondary | ICD-10-CM

## 2022-03-20 DIAGNOSIS — E66811 Obesity, class 1: Secondary | ICD-10-CM

## 2022-03-20 DIAGNOSIS — Z6832 Body mass index (BMI) 32.0-32.9, adult: Secondary | ICD-10-CM

## 2022-03-20 DIAGNOSIS — M159 Polyosteoarthritis, unspecified: Secondary | ICD-10-CM

## 2022-03-20 HISTORY — DX: Body mass index (BMI) 32.0-32.9, adult: Z68.32

## 2022-03-20 HISTORY — DX: Obesity, class 1: E66.811

## 2022-03-20 HISTORY — DX: Pure hyperglyceridemia: E78.1

## 2022-03-20 MED ORDER — LOSARTAN POTASSIUM 50 MG PO TABS: 50.0000 mg | ORAL_TABLET | Freq: Every day | ORAL | 0 refills | Status: DC

## 2022-03-20 MED ORDER — TRIAMCINOLONE ACETONIDE 0.5 % EX OINT: 1.0000 "application " | TOPICAL_OINTMENT | Freq: Two times a day (BID) | CUTANEOUS | 0 refills | Status: DC

## 2022-03-20 MED ORDER — CELECOXIB 100 MG PO CAPS: 100.0000 mg | ORAL_CAPSULE | Freq: Two times a day (BID) | ORAL | 0 refills | Status: DC

## 2022-03-20 NOTE — Patient Instructions (Addendum)
It was great seeing you today! ? ?Plan discussed at today's visit: ?-Blood work ordered today, results will be uploaded to [facility].  ?-Refills sent, try Celebrex for arthritis  ?-Continue to work on being active and in a 5% calorie restriction for weight loss  ?-Call insurance about Wegovy or Ozempic to see if they will cover it  ?-Try Biotin for hair and nail growth  ? ?Follow up in: 6 months or sooner as needed  ? ?Take care and let us know if you have any questions or concerns prior to your next visit. ? ?Dr. Rosana Berger ? ?

## 2022-03-20 NOTE — Assessment & Plan Note (Signed)
Had been on anti-inflammatories in the past but stopped due to stomach discomfort. Had EGD in 2016, gastritis without hemorrhage. Using Voltaren gel and Aleve occasionally, will trial Celebrex. ?

## 2022-03-20 NOTE — Assessment & Plan Note (Signed)
Check A1c, discussed lifestyle management and calorie restriction. History of cardiac issues on phentermine. Interested in [employer], will obtain labs and have her call insurance about coverage of these medications. Education on lifestyle changes and healthy weight loss.  ?

## 2022-03-20 NOTE — Assessment & Plan Note (Signed)
Stable on Triamcinolone cream 0.5%, refilled today. ?

## 2022-03-20 NOTE — Assessment & Plan Note (Signed)
Blood pressure stable today, continue Losartan 40 mg daily, refills today. ?

## 2022-03-20 NOTE — Assessment & Plan Note (Signed)
- 

## 2022-03-20 NOTE — Progress Notes (Signed)
? ?Established Patient Office Visit ? ?Subjective:  ?Patient ID: Ruth Bishop, female    DOB: [birth year]  Age: 76 y.o. MRN: [id number] ? ?CC:  ?Chief Complaint  ?Patient presents with  ? Follow-up  ? Medication Refill  ? ? ?HPI ?Ruth Bishop presents for follow up on chronic medical conditions.  ? ?Decreased Hearing:  ?-More so on the right ear, no ear pain or fullness. No tinnitus.  ?-Auditory test in 7/22 with good results ?-Feels like having more of an issue understanding speech vs. Hearing loss ? ?Hypertension: ?-Medications: Losartan 40 ?-Patient is compliant with above medications and reports no side effects. ?-Denies any SOB, CP, vision changes, LE edema or symptoms of hypotension ? ?Arthritis: ?-Currently taking over the counter Aleve as needed not daily ?-Plays tennis  ?-Primarily in knees and lower back  ?-Does use Voltaren gel occasionally  ? ?Weight Gain: ?-Had been on Phentermine in the past but caused a heart murmur ?-Unhappy with weight but hasn't tried to change diet, does play tennis for exercise but limited due to arthritis.  ?-BMI 30.8 ? ?Chronic Dermatitis: ?-Uses Triamcinolone cream 0.5% ? ?Health Maintenance: ?-Blood work due ?-Mammogram 11/22 Birads-1 ?-Colonoscopy 10/16  ? ?Past Medical History:  ?Diagnosis Date  ? Abnormal chest x-ray 10/18/2018  ? Arthritis   ? Chronic pain 05/24/2015  ? Diastolic dysfunction 03/15/8118  ? GERD (gastroesophageal reflux disease)   ? Heart murmur   ? Hormone replacement therapy 05/26/2016  ? Hypertension   ? Incidental lung nodule, > 80m and < 85m8/29/2018  ? 4 mm ground glass nodule LLL incidentally noted on CT scan Nov 2016  ? Osteoarthritis of both knees 06/13/2015  ? [facility]   ? Post menopausal syndrome 05/24/2015  ? Skin-picking disorder 05/24/2015  ? when nervous/stressed chews on fingers and peels down skin, started with cuticles but now hypopigmentation of fingers down to palm in some areas with lifetime of skin picking.   ? ? ?Past Surgical  History:  ?Procedure Laterality Date  ? ABDOMINAL HYSTERECTOMY    ? COLONOSCOPY WITH PROPOFOL N/A 10/01/2015  ? Procedure: COLONOSCOPY WITH PROPOFOL;  Surgeon: MaLollie SailsMD;  [facility]  Service: Endoscopy;  Laterality: N/A;  ? ESOPHAGOGASTRODUODENOSCOPY (EGD) WITH PROPOFOL N/A 10/01/2015  ? Procedure: ESOPHAGOGASTRODUODENOSCOPY (EGD) WITH PROPOFOL;  Surgeon: MaLollie SailsMD;  [facility];  Service: Endoscopy;  Laterality: N/A;  ? ? ?Family History  ?Problem Relation Age of Onset  ? Heart disease Mother   ? Diabetes Mother   ? Aneurysm Mother   ? Heart disease Father   ? Heart attack Father   ? Heart disease Sister   ? Diabetes Sister   ? Pneumonia Sister   ? Heart disease Brother   ? Diabetes Brother   ? Diabetes Sister   ? Heart disease Sister   ? Congestive Heart Failure Sister   ? Breast cancer Neg Hx   ? ? ?Social History  ? ?Socioeconomic History  ? Marital status: Widowed  ?  Spouse name: Not on file  ? Number of children: 1  ? Years of education: Not on file  ? Highest education level: Master's degree (e.g., MA, MS, MEng, MEd, MSW, MBA)  ?Occupational History  ? Occupation: Retired  ?Tobacco Use  ? Smoking status: Never  ? Smokeless tobacco: Never  ? Tobacco comments:  ?  smoking cessation materials not required  ?Vaping Use  ? Vaping Use: Never used  ?Substance and Sexual Activity  ? Alcohol  use: Not Currently  ?  Alcohol/week: 0.0 standard drinks  ? Drug use: No  ? Sexual activity: Not Currently  ?Other Topics Concern  ? Not on file  ?Social History Narrative  ? Pt lives alone  ? ?Social Determinants of Health  ? ?Financial Resource Strain: Low Risk   ? Difficulty of Paying Living Expenses: Not hard at all  ?Food Insecurity: No Food Insecurity  ? Worried About Charity fundraiser in the Last Year: Never true  ? Ran Out of Food in the Last Year: Never true  ?Transportation Needs: No Transportation Needs  ? Lack of Transportation (Medical): No  ? Lack of  Transportation (Non-Medical): No  ?Physical Activity: Sufficiently Active  ? Days of Exercise per Week: 4 days  ? Minutes of Exercise per Session: 90 min  ?Stress: No Stress Concern Present  ? Feeling of Stress : Not at all  ?Social Connections: Moderately Integrated  ? Frequency of Communication with Friends and Family: More than three times a week  ? Frequency of Social Gatherings with Friends and Family: More than three times a week  ? Attends Religious Services: More than 4 times per year  ? Active Member of Clubs or Organizations: Yes  ? Attends Archivist Meetings: More than 4 times per year  ? Marital Status: Widowed  ?Intimate Partner Violence: Not At Risk  ? Fear of Current or Ex-Partner: No  ? Emotionally Abused: No  ? Physically Abused: No  ? Sexually Abused: No  ? ? ?Outpatient Medications Prior to Visit  ?Medication Sig Dispense Refill  ? triamcinolone ointment (KENALOG) 0.5 % Apply 1 application. topically 2 (two) times daily. 30 g 0  ? aspirin EC 81 MG tablet Take 81 mg by mouth daily.    ? Calcium Carbonate-Vitamin D 600-400 MG-UNIT tablet Take 1 tablet by mouth daily.     ? docusate sodium (COLACE) 250 MG capsule Take 250 mg by mouth daily.    ? famotidine (PEPCID) 20 MG tablet Take 1 tablet (20 mg total) by mouth 2 (two) times daily as needed for heartburn or indigestion. 60 tablet 2  ? fluticasone (FLONASE) 50 MCG/ACT nasal spray Place 2 sprays into both nostrils daily. 16 g 6  ? losartan (COZAAR) 50 MG tablet Take 1 tablet (50 mg total) by mouth daily. 90 tablet 0  ? MULTIPLE VITAMIN PO Take 1 tablet by mouth daily.     ? pantoprazole (PROTONIX) 40 MG tablet Take 1 tablet (40 mg total) by mouth daily as needed. 30 tablet 1  ? psyllium (METAMUCIL) 0.52 g capsule Take 0.52 g by mouth daily.    ? tiZANidine (ZANAFLEX) 2 MG tablet Take 1 tablet (2 mg total) by mouth every 8 (eight) hours as needed for muscle spasms. 30 tablet 0  ? TRI-LUMA 0.01-4-0.05 % CREA SMARTSIG:1 Sparingly Topical  Every Night    ? TURMERIC PO Take 1 tablet by mouth daily.     ? ?No facility-administered medications prior to visit.  ? ? ?Allergies  ?Allergen Reactions  ? Nitrofurantoin Monohyd Macro   ? ? ?ROS ?Review of Systems  ?Constitutional:  Negative for chills and fever.  ?HENT:  Positive for hearing loss. Negative for ear discharge, ear pain and tinnitus.   ?Respiratory:  Negative for cough and shortness of breath.   ?Cardiovascular:  Negative for chest pain.  ?Gastrointestinal:  Negative for abdominal pain and blood in stool.  ?Skin:  Positive for color change.  ?Neurological:  Negative for  dizziness and headaches.  ? ?  ?Objective:  ?  ?Physical Exam ?Constitutional:   ?   Appearance: Normal appearance.  ?HENT:  ?   Head: Normocephalic and atraumatic.  ?   Right Ear: Tympanic membrane, ear canal and external ear normal.  ?   Left Ear: Tympanic membrane, ear canal and external ear normal.  ?Eyes:  ?   Conjunctiva/sclera: Conjunctivae normal.  ?Cardiovascular:  ?   Rate and Rhythm: Normal rate and regular rhythm.  ?Pulmonary:  ?   Effort: Pulmonary effort is normal.  ?   Breath sounds: Normal breath sounds.  ?Musculoskeletal:  ?   Right lower leg: No edema.  ?   Left lower leg: No edema.  ?Skin: ?   General: Skin is warm and dry.  ?Neurological:  ?   General: No focal deficit present.  ?   Mental Status: She is alert. Mental status is at baseline.  ?Psychiatric:     ?   Mood and Affect: Mood normal.     ?   Behavior: Behavior normal.  ? ? ?BP 134/76   Pulse 77   Temp 97.9 ?F (36.6 ?C)   Resp 16   Ht '5\' 10"'$  (1.778 m)   Wt 215 lb 1.6 oz (97.6 kg)   SpO2 99%   BMI 30.86 kg/m?  ?Wt Readings from Last 3 Encounters:  ?03/20/22 215 lb 1.6 oz (97.6 kg)  ?02/13/22 215 lb 14.4 oz (97.9 kg)  ?12/17/21 214 lb 11.2 oz (97.4 kg)  ? ? ? ?Health Maintenance Due  ?Topic Date Due  ? COVID-19 Vaccine (5 - Booster for [place] series) 10/21/2021  ? ? ?There are no preventive care reminders to display for this patient. ? ?Lab Results   ?Component Value Date  ? TSH 1.05 02/04/2018  ? ?Lab Results  ?Component Value Date  ? WBC 5.5 04/03/2021  ? HGB 13.5 04/03/2021  ? HCT 42.3 04/03/2021  ? MCV 82.3 04/03/2021  ? PLT 272 04/03/2021  ? ?Lab Results  ?Compone

## 2022-03-21 LAB — COMPLETE METABOLIC PANEL WITH GFR
AG Ratio: 1.3 (calc) (ref 1.0–2.5)
ALT: 9 U/L (ref 6–29)
AST: 13 U/L (ref 10–35)
Albumin: 3.9 g/dL (ref 3.6–5.1)
Alkaline phosphatase (APISO): 85 U/L (ref 37–153)
BUN: 17 mg/dL (ref 7–25)
CO2: 24 mmol/L (ref 20–32)
Calcium: 9.2 mg/dL (ref 8.6–10.4)
Chloride: 108 mmol/L (ref 98–110)
Creat: 0.87 mg/dL (ref 0.60–1.00)
Globulin: 3 g/dL (calc) (ref 1.9–3.7)
Glucose, Bld: 95 mg/dL (ref 65–99)
Potassium: 4.5 mmol/L (ref 3.5–5.3)
Sodium: 142 mmol/L (ref 135–146)
Total Bilirubin: 0.3 mg/dL (ref 0.2–1.2)
Total Protein: 6.9 g/dL (ref 6.1–8.1)
eGFR: 70 mL/min/{1.73_m2} (ref >=60)

## 2022-03-21 LAB — CBC WITH DIFFERENTIAL/PLATELET
Absolute Monocytes: 281 cells/uL (ref 200–950)
Basophils Absolute: 22 cells/uL (ref 0–200)
Basophils Relative: 0.4 %
Eosinophils Absolute: 211 cells/uL (ref 15–500)
Eosinophils Relative: 3.9 %
HCT: 42.1 % (ref 35.0–45.0)
Hemoglobin: 13.6 g/dL (ref 11.7–15.5)
Lymphs Abs: 2619 cells/uL (ref 850–3900)
MCH: 26.7 pg — ABNORMAL LOW (ref 27.0–33.0)
MCHC: 32.3 g/dL (ref 32.0–36.0)
MCV: 82.7 fL (ref 80.0–100.0)
MPV: 10.4 fL (ref 7.5–12.5)
Monocytes Relative: 5.2 %
Neutro Abs: 2268 cells/uL (ref 1500–7800)
Neutrophils Relative %: 42 %
Platelets: 272 10*3/uL (ref 140–400)
RBC: 5.09 10*6/uL (ref 3.80–5.10)
RDW: 14.2 % (ref 11.0–15.0)
Total Lymphocyte: 48.5 %
WBC: 5.4 10*3/uL (ref 3.8–10.8)

## 2022-03-21 LAB — LIPID PANEL
Cholesterol: 150 mg/dL (ref <200)
HDL: 59 mg/dL (ref >=50)
LDL Cholesterol (Calc): 71 mg/dL (calc)
Non-HDL Cholesterol (Calc): 91 mg/dL (calc) (ref <130)
Total CHOL/HDL Ratio: 2.5 (calc) (ref <5.0)
Triglycerides: 114 mg/dL (ref <150)

## 2022-03-21 LAB — HEMOGLOBIN A1C
Hgb A1c MFr Bld: 5.7 % of total Hgb — ABNORMAL HIGH (ref <5.7)
Mean Plasma Glucose: 117 mg/dL
eAG (mmol/L): 6.5 mmol/L

## 2022-04-07 ENCOUNTER — Telehealth: Payer: Self-pay

## 2022-04-07 NOTE — Telephone Encounter (Signed)
Copied from [place]. Topic: Complaint - Billing/Coding >> Apr 06, 2022  4:55 PM Erick Blinks wrote: DOS: 03/20/2022  Details of complaint: Pt says she is receiving a bill incorrectly How would the patient like to see this issue resolved?   Pt says the bill has her PCP listed as a specialist instead of primary care. She wants this corrected.    Route to Engineer, building services.

## 2022-04-07 NOTE — Telephone Encounter (Signed)
Copied from [place]. Topic: Complaint - Billing/Coding >> Apr 06, 2022  4:55 PM Erick Blinks wrote: DOS: 03/20/2022  Details of complaint: Pt says she is receiving a bill incorrectly How would the patient like to see this issue resolved?   Pt says the bill has her PCP listed as a specialist instead of primary care. She wants this corrected.    Route to Engineer, building services.

## 2022-04-07 NOTE — Telephone Encounter (Signed)
Message has been forwarded to one of the [facility].  Once response is given patient will be contacted with an update.

## 2022-04-27 ENCOUNTER — Telehealth: Payer: Self-pay | Admitting: Internal Medicine

## 2022-04-27 NOTE — Telephone Encounter (Signed)
Patient has been on Losartan 50 mg since 2016, dose has not changed.

## 2022-04-28 NOTE — Telephone Encounter (Signed)
Pt is calling back requesting an update. Pt is requesting a call back.    Pt is requesting a call back today.

## 2022-05-20 NOTE — Progress Notes (Signed)
Cardiology Office Note  Date:  05/22/2022   ID:  Ruth Bishop, DOB Oct 09, 1946, MRN [id number]  PCP:  Ruth Medici, DO   Chief Complaint  Patient presents with   12 month follow up     "Doing well." Medications reviewed by the patient verbally.     HPI:  [name] is a 76 year old woman with past medical history of HTN Fatigue Chest pain Who presents for follow-up of her chest pain sx, costochondritis  Last seen by myself in clinic November 2020 Seen by one of our providers November 2021 At that time no new issues  On today's visit reports she is doing well Lots of travel, Single ,  looking for companion Going to the gym, tennis 2x a week Rare tightness in the chest, feels that is from arthritis  Lab work reviewed Total cholesterol 150, LDL 71 A1c less than 6  EKG personally reviewed by myself on todays visit Nsr rate 67 no ST or T wave changes  Other past medical history reviewed Seen by rheumatology at [facility], then [facility] Elevated CRP and inflammatory markers Reports symptoms are typically well controlled with heating pack, NSAIDs  Remote history of having epidural injection to her chest for pain relief   No chest pain or shortness of breath on exertion  CT scan images from 2010 reviewed showing no PAD, aortic atherosclerosis, no coronary calcification  CT scan 02/07/2018 No coronary calcifications, no aortic atherosclerosis  CT scan 08/2017, images pulled up in the office As above no coronary calcification or aortic atherosclerosis  Echo 10/2017 reviewed with her in detail Left ventricle: The cavity size was normal. Systolic function was normal. The estimated ejection fraction was in the range of 60% to 65%. Wall motion was normal; there were no regional wall motion abnormalities. Doppler parameters are consistent with abnormal left ventricular relaxation (grade 1 diastolic dysfunction). - Aortic valve: There was mild regurgitation. - Left atrium:  The atrium was normal in size. - Right ventricle: Systolic function was normal. - Pulmonary arteries: Systolic pressure was within the normal   range.  Previous records have been requested, reviewed today Echocardiogram from June 2014 was essentially normal ejection fraction 60-65%  PMH:   has a past medical history of Abnormal chest x-ray (10/18/2018), Arthritis, Chronic pain (12/09/7251), Diastolic dysfunction (6/64/4034), GERD (gastroesophageal reflux disease), Heart murmur, Hormone replacement therapy (05/26/2016), Hypertension, Hypertriglyceridemia (03/20/2022), Incidental lung nodule, > 76m and < 858m(08/04/2017), Osteoarthritis of both knees (06/13/2015), Post menopausal syndrome (05/24/2015), and Skin-picking disorder (05/24/2015).  PSH:    Past Surgical History:  Procedure Laterality Date   ABDOMINAL HYSTERECTOMY     COLONOSCOPY WITH PROPOFOL N/A 10/01/2015   Procedure: COLONOSCOPY WITH PROPOFOL;  Surgeon: MaLollie SailsMD;  [facility];  Service: Endoscopy;  Laterality: N/A;   ESOPHAGOGASTRODUODENOSCOPY (EGD) WITH PROPOFOL N/A 10/01/2015   Procedure: ESOPHAGOGASTRODUODENOSCOPY (EGD) WITH PROPOFOL;  Surgeon: MaLollie SailsMD;  [facility]  Service: Endoscopy;  Laterality: N/A;    Current Outpatient Medications  Medication Sig Dispense Refill   aspirin EC 81 MG tablet Take 81 mg by mouth daily.     Calcium Carbonate-Vitamin D 600-400 MG-UNIT tablet Take 1 tablet by mouth daily.      celecoxib (CELEBREX) 100 MG capsule Take 1 capsule (100 mg total) by mouth 2 (two) times daily. 90 capsule 0   docusate sodium (COLACE) 250 MG capsule Take 250 mg by mouth daily.     famotidine (PEPCID) 20 MG tablet Take 1 tablet (  20 mg total) by mouth 2 (two) times daily as needed for heartburn or indigestion. 60 tablet 2   fluticasone (FLONASE) 50 MCG/ACT nasal spray Place 2 sprays into both nostrils daily. 16 g 6   losartan (COZAAR) 50 MG tablet Take 1 tablet (50 mg  total) by mouth daily. 90 tablet 0   MULTIPLE VITAMIN PO Take 1 tablet by mouth daily.      pantoprazole (PROTONIX) 40 MG tablet Take 1 tablet (40 mg total) by mouth daily as needed. 30 tablet 1   psyllium (METAMUCIL) 0.52 g capsule Take 0.52 g by mouth daily.     tiZANidine (ZANAFLEX) 2 MG tablet Take 1 tablet (2 mg total) by mouth every 8 (eight) hours as needed for muscle spasms. 30 tablet 0   TRI-LUMA 0.01-4-0.05 % CREA SMARTSIG:1 Sparingly Topical Every Night     triamcinolone ointment (KENALOG) 0.5 % Apply 1 application. topically 2 (two) times daily. 30 g 0   TURMERIC PO Take 1 tablet by mouth daily.      No current facility-administered medications for this visit.    Allergies:   Nitrofurantoin monohyd macro   Social History:  The patient  reports that she has never smoked. She has never used smokeless tobacco. She reports that she does not currently use alcohol. She reports that she does not use drugs.   Family History:   family history includes Aneurysm in her mother; Congestive Heart Failure in her sister; Diabetes in her brother, mother, sister, and sister; Heart attack in her father; Heart disease in her brother, father, mother, sister, and sister; Pneumonia in her sister.    Review of Systems: Review of Systems  Constitutional: Negative.   HENT: Negative.    Respiratory: Negative.    Cardiovascular: Negative.   Gastrointestinal: Negative.   Musculoskeletal: Negative.   Neurological: Negative.   Psychiatric/Behavioral: Negative.    All other systems reviewed and are negative.   PHYSICAL EXAM: VS:  BP 140/82 (BP Location: Left Arm, Patient Position: Sitting, Cuff Size: Normal)   Pulse 67   Ht 5' 9.5" (1.765 m)   Wt 215 lb (97.5 kg)   SpO2 98%   BMI 31.29 kg/m  , BMI Body mass index is 31.29 kg/m. Constitutional:  oriented to person, place, and time. No distress.  HENT:  Head: Grossly normal Eyes:  no discharge. No scleral icterus.  Neck: No JVD, no carotid  bruits  Cardiovascular: Regular rate and rhythm, no murmurs appreciated Pulmonary/Chest: Clear to auscultation bilaterally, no wheezes or rails Abdominal: Soft.  no distension.  no tenderness.  Musculoskeletal: Normal range of motion Neurological:  normal muscle tone. Coordination normal. No atrophy Skin: Skin warm and dry Psychiatric: normal affect, pleasant  Recent Labs: 03/20/2022: ALT 9; BUN 17; Creat 0.87; Hemoglobin 13.6; Platelets 272; Potassium 4.5; Sodium 142    Lipid Panel Lab Results  Component Value Date   CHOL 150 03/20/2022   HDL 59 03/20/2022   LDLCALC 71 03/20/2022   TRIG 114 03/20/2022      Wt Readings from Last 3 Encounters:  05/22/22 215 lb (97.5 kg)  03/20/22 215 lb 1.6 oz (97.6 kg)  02/13/22 215 lb 14.4 oz (97.9 kg)     ASSESSMENT AND PLAN:  Other chronic pain Prior history of chest discomfort, felt to be costochondritis No recent symptoms requiring intervention Previously reported using hot pack, NSAIDs as needed  Hypertension goal BP (blood pressure)  Blood pressure is well controlled on today's visit. No changes made to the  medications.  Other fatigue Reports good energy, playing tennis  Costochondritis In the past with elevated ANA/elevated CRP Previously seen by rheumatology at [facility] and in [place] No recent events/flareups   Total encounter time more than 30 minutes  Greater than 50% was spent in counseling and coordination of care with the patient    Orders Placed This Encounter  Procedures   EKG 12-Lead     Signed, Esmond Plants, M.D., Ph.D. 05/22/2022  [place], [place]

## 2022-05-22 ENCOUNTER — Encounter: Payer: Self-pay | Admitting: Cardiovascular Disease

## 2022-05-22 ENCOUNTER — Ambulatory Visit: Payer: Medicare PPO | Admitting: Cardiovascular Disease

## 2022-05-22 VITALS — BP 140/82 | HR 67 | Ht 69.5 in | Wt 215.0 lb

## 2022-05-22 DIAGNOSIS — I10 Essential (primary) hypertension: Secondary | ICD-10-CM | POA: Diagnosis not present

## 2022-05-22 DIAGNOSIS — K219 Gastro-esophageal reflux disease without esophagitis: Secondary | ICD-10-CM | POA: Diagnosis not present

## 2022-05-22 DIAGNOSIS — I340 Nonrheumatic mitral (valve) insufficiency: Secondary | ICD-10-CM | POA: Diagnosis not present

## 2022-05-22 DIAGNOSIS — M940 Chondrocostal junction syndrome [Tietze]: Secondary | ICD-10-CM | POA: Diagnosis not present

## 2022-05-22 DIAGNOSIS — R079 Chest pain, unspecified: Secondary | ICD-10-CM | POA: Diagnosis not present

## 2022-05-22 DIAGNOSIS — I517 Cardiomegaly: Secondary | ICD-10-CM | POA: Diagnosis not present

## 2022-05-22 DIAGNOSIS — E781 Pure hyperglyceridemia: Secondary | ICD-10-CM

## 2022-05-22 NOTE — Patient Instructions (Addendum)
Medication Instructions:  No changes  If you need a refill on your cardiac medications before your next appointment, please call your pharmacy.   Lab work: No new labs needed  Testing/Procedures: No new testing needed  Follow-Up: At [facility], you and your health needs are our priority.  As part of our continuing mission to provide you with exceptional heart care, we have created designated Provider Care Teams.  These Care Teams include your primary Cardiologist (physician) and Advanced Practice Providers (APPs -  Physician Assistants and Nurse Practitioners) who all work together to provide you with the care you need, when you need it.  You will need a follow up appointment in 12 months as needed  Providers on your designated Care Team:   Murray Hodgkins, NP Christell Faith, PA-C Cadence Kathlen Mody, [place]  COVID-19 Vaccine Information can be found at: [email or web address].uk For questions related to vaccine distribution or appointments, please email [email or web address] or call [phone].

## 2022-06-23 ENCOUNTER — Other Ambulatory Visit: Payer: Self-pay | Admitting: Internal Medicine

## 2022-06-23 DIAGNOSIS — J683 Other acute and subacute respiratory conditions due to chemicals, gases, fumes and vapors: Secondary | ICD-10-CM

## 2022-06-23 NOTE — Telephone Encounter (Signed)
Medication Refill - Medication: albuterol (VENTOLIN HFA) 108 (90 Base) MCG/ACT inhaler / pt was advised by provider to call for this if she needed / please advise pt when this is sent to pharmacy   Has the patient contacted their pharmacy? No. (Agent: If no, request that the patient contact the pharmacy for the refill. If patient does not wish to contact the pharmacy document the reason why and proceed with request.) (Agent: If yes, when and what did the pharmacy advise?)  Preferred Pharmacy (with phone number or street name): [facility] / [address]  Has the patient been seen for an appointment in the last year OR does the patient have an upcoming appointment? Yes.    Agent: Please be advised that RX refills may take up to 3 business days. We ask that you follow-up with your pharmacy.

## 2022-06-24 MED ORDER — ALBUTEROL SULFATE HFA 108 (90 BASE) MCG/ACT IN AERS: 2.0000 | INHALATION_SPRAY | RESPIRATORY_TRACT | 1 refills | Status: AC | PRN

## 2022-06-24 NOTE — Telephone Encounter (Signed)
Requested medication (s) are due for refill today: no, was d/c'd but pt told to call if needed it again  Requested medication (s) are on the active medication list: no  Last refill:  03/18/2020, discontinued 10/14/21 due to not using but told to call if needed  Future visit scheduled: no, seen 03/20/22  Notes to clinic:  Pt would like to have this ordered again. Please assess.      Requested Prescriptions  Pending Prescriptions Disp Refills   albuterol (VENTOLIN HFA) 108 (90 Base) MCG/ACT inhaler 18 g 1    Sig: Inhale 2 puffs into the lungs every 4 (four) hours as needed for wheezing or shortness of breath.     Pulmonology:  Beta Agonists 2 Failed - 06/23/2022  9:05 AM      Failed - Last BP in normal range    BP Readings from Last 1 Encounters:  05/22/22 140/82         Passed - Last Heart Rate in normal range    Pulse Readings from Last 1 Encounters:  05/22/22 67         Passed - Valid encounter within last 12 months    Recent Outpatient Visits           3 months ago Hypertension goal BP (blood pressure) < 150/90   [place], DO   4 months ago Impacted cerumen of left ear   [place], DO   6 months ago Strain of lumbar region, initial encounter   [place], DO   8 months ago Urinary frequency   [facility] Myles Gip, DO   1 year ago Annual physical exam   [facility] Delsa Grana, PA-C       Future Appointments             In 3 months [facility], [facility]

## 2022-07-01 ENCOUNTER — Other Ambulatory Visit: Payer: Self-pay | Admitting: Internal Medicine

## 2022-07-01 DIAGNOSIS — L309 Dermatitis, unspecified: Secondary | ICD-10-CM

## 2022-07-02 NOTE — Telephone Encounter (Signed)
Requested medication (s) are due for refill today:   No  Provider to review  Requested medication (s) are on the active medication list:   No  Future visit scheduled:   Yes   Last ordered: Discontinued 03/03/2022   Non delegated refill.  Discontinued   Requested Prescriptions  Pending Prescriptions Disp Refills   triamcinolone cream (KENALOG) 0.1 % [Pharmacy Med Name: TRIAMCINOLONE ACETONIDE 0.1 % Cream] 45 g 1    Sig: APPLY TO THE AFFECTED AREA(S) TOPICALLY TWICE DAILY AS NEEDED     Not Delegated - Dermatology:  Corticosteroids Failed - 07/01/2022  6:09 PM      Failed - This refill cannot be delegated      Passed - Valid encounter within last 12 months    Recent Outpatient Visits           3 months ago Hypertension goal BP (blood pressure) < 150/90   [place], DO   4 months ago Impacted cerumen of left ear   [place], DO   6 months ago Strain of lumbar region, initial encounter   [facility] Teodora Medici, DO   8 months ago Urinary frequency   [facility] Myles Gip, DO   1 year ago Annual physical exam   [facility] Delsa Grana, PA-C       Future Appointments             In 3 months [facility], [facility]

## 2022-07-06 ENCOUNTER — Other Ambulatory Visit: Payer: Self-pay

## 2022-07-06 DIAGNOSIS — L309 Dermatitis, unspecified: Secondary | ICD-10-CM

## 2022-07-06 MED ORDER — TRIAMCINOLONE ACETONIDE 0.5 % EX OINT: 1.0000 | TOPICAL_OINTMENT | Freq: Two times a day (BID) | CUTANEOUS | 0 refills | Status: DC

## 2022-07-29 ENCOUNTER — Ambulatory Visit: Payer: Self-pay | Admitting: *Deleted

## 2022-07-29 NOTE — Telephone Encounter (Signed)
Chief Complaint: New COVID vaccine Symptoms: None Frequency: N/A Pertinent Negatives: Patient denies N/A Disposition: '[]'$ ED /'[]'$ Urgent Care (no appt availability in office) / '[]'$ Appointment(In office/virtual)/ '[]'$  [place] [facility]/ '[]'$ Home Care/ '[]'$ Refused Recommended Disposition /'[]'$ [place] [facility]/ '[x]'$  Follow-up with PCP Additional Notes: Patient returned call and asked if there is another COVID vaccine that she should get to protect her from the newest variant that is out there and if she needs another booster. Advised there is no documentation on [facility] website of a new vaccine, advised I will send this to Dr. Rosana Berger and someone will call back with her recommendation.     Summary: Covid Vaccine    Patient wants to know if there is a new Covid vaccine out for the new variant. She states that she has already receive all of her vaccines, but want to know if she needs another one. Please advise.

## 2022-07-29 NOTE — Telephone Encounter (Signed)
Patient wants to know if there is a new Covid vaccine out for the new variant. She states that she has already receive all of her vaccines, but want to know if she needs another one. Please advise.   Attempted to reach pt, call cannot be completed at this time.

## 2022-07-29 NOTE — Telephone Encounter (Signed)
Summary: Covid Vaccine   Patient wants to know if there is a new Covid vaccine out for the new variant. She states that she has already receive all of her vaccines, but want to know if she needs another one. Please advise.       2nd call attempted to patient. Recording states call cannot be completed at this time , call again later. Unable to leave message.

## 2022-07-29 NOTE — Telephone Encounter (Signed)
Reason for Disposition . [1] Caller requesting NON-URGENT health information AND [2] PCP's office is the best resource  Answer Assessment - Initial Assessment Questions 1. REASON FOR CALL or QUESTION: "What is your reason for calling today?" or "How can I best help you?" or "What question do you have that I can help answer?"     Wanting to know if there is a new COVID vaccine out for the new variant  Protocols used: Information Only Call - No Triage-A-AH

## 2022-07-29 NOTE — Telephone Encounter (Signed)
Patient called, left VM on home phone to return the call to the office to discuss symptoms with a nurse, no answer on cell phone. Unable to reach patient after 3 attempts by [facility] NT, routing to the provider for resolution per protocol.     Summary: Covid Vaccine   Patient wants to know if there is a new Covid vaccine out for the new variant. She states that she has already receive all of her vaccines, but want to know if she needs another one. Please advise.

## 2022-07-30 NOTE — Telephone Encounter (Signed)
Left detailed vm, Dr. Rosana Berger states she thinks a new one is coming out but not yet available.  Pt can contact pharmacy for details

## 2022-09-08 ENCOUNTER — Ambulatory Visit: Payer: Self-pay | Admitting: *Deleted

## 2022-09-08 ENCOUNTER — Other Ambulatory Visit: Payer: Self-pay | Admitting: Family Medicine

## 2022-09-08 DIAGNOSIS — Z1231 Encounter for screening mammogram for malignant neoplasm of breast: Secondary | ICD-10-CM

## 2022-09-08 NOTE — Telephone Encounter (Signed)
Error

## 2022-09-16 ENCOUNTER — Ambulatory Visit: Payer: Medicare PPO | Admitting: Internal Medicine

## 2022-09-16 ENCOUNTER — Encounter: Payer: Self-pay | Admitting: Family Medicine

## 2022-09-16 ENCOUNTER — Ambulatory Visit (INDEPENDENT_AMBULATORY_CARE_PROVIDER_SITE_OTHER): Payer: Medicare PPO | Admitting: Family Medicine

## 2022-09-16 VITALS — BP 144/70 | HR 82 | Temp 97.8°F | Resp 16 | Ht 69.5 in | Wt 206.5 lb

## 2022-09-16 DIAGNOSIS — R1010 Upper abdominal pain, unspecified: Secondary | ICD-10-CM

## 2022-09-16 DIAGNOSIS — R634 Abnormal weight loss: Secondary | ICD-10-CM | POA: Diagnosis not present

## 2022-09-16 DIAGNOSIS — R7303 Prediabetes: Secondary | ICD-10-CM

## 2022-09-16 DIAGNOSIS — B372 Candidiasis of skin and nail: Secondary | ICD-10-CM

## 2022-09-16 DIAGNOSIS — K219 Gastro-esophageal reflux disease without esophagitis: Secondary | ICD-10-CM

## 2022-09-16 DIAGNOSIS — R630 Anorexia: Secondary | ICD-10-CM

## 2022-09-16 DIAGNOSIS — I10 Essential (primary) hypertension: Secondary | ICD-10-CM

## 2022-09-16 DIAGNOSIS — E781 Pure hyperglyceridemia: Secondary | ICD-10-CM

## 2022-09-16 DIAGNOSIS — J683 Other acute and subacute respiratory conditions due to chemicals, gases, fumes and vapors: Secondary | ICD-10-CM

## 2022-09-16 MED ORDER — PANTOPRAZOLE SODIUM 40 MG PO TBEC: 40.0000 mg | DELAYED_RELEASE_TABLET | Freq: Every day | ORAL | 1 refills | Status: DC

## 2022-09-16 MED ORDER — NYSTATIN 100000 UNIT/GM EX POWD: 1.0000 | Freq: Three times a day (TID) | CUTANEOUS | 2 refills | Status: DC

## 2022-09-16 NOTE — Progress Notes (Signed)
Name: Ruth Bishop   MRN: [id number]    DOB: [birth year]   Date:09/16/2022       Progress Note  Chief Complaint  Patient presents with   Follow-up   Hyperlipidemia   Hypertension   Gastroesophageal Reflux     Subjective:   Ruth Bishop is a 76 y.o. female, presents to clinic for f/up on chronic medical conditions   Decreased Hearing:  Prior impaction, she wants ears checked again today   Hypertension: -Medications: Losartan 50 mg daily -Patient is compliant with above medications and reports no side effects. -Pt denies any SOB, CP, vision changes, LE edema or symptoms of hypotension BP Readings from Last 3 Encounters:  09/16/22 (!) 144/70  05/22/22 140/82  03/20/22 134/76    Arthritis: -Currently taking over the counter Aleve as needed not daily -Plays tennis  -Primarily in knees and lower back  -Does use Voltaren gel occasionally  Last OV PCP sent in celebrex - she doesn't want it    Weight: Decreased 9 lbs some loss of appetite, getting full easily  Wt Readings from Last 5 Encounters:  09/16/22 206 lb 8 oz (93.7 kg)  05/22/22 215 lb (97.5 kg)  03/20/22 215 lb 1.6 oz (97.6 kg)  02/13/22 215 lb 14.4 oz (97.9 kg)  12/17/21 214 lb 11.2 oz (97.4 kg)   BMI Readings from Last 5 Encounters:  09/16/22 30.06 kg/m  05/22/22 31.29 kg/m  03/20/22 30.86 kg/m  02/13/22 30.98 kg/m  12/17/21 30.81 kg/m     Chronic Dermatitis: -Uses Triamcinolone cream 0.5%    Health Maintenance: Health Maintenance  Topic Date Due   COVID-19 Vaccine (5 - Pfizer series) 10/21/2021   Mammogram  10/03/2022   Tetanus Vaccine  05/23/2025   Colon Cancer Screening  09/30/2025   Pneumonia Vaccine  Completed   Flu Shot  Completed   DEXA scan (bone density measurement)  Completed   Hepatitis C Screening: USPSTF Recommendation to screen - Ages 37-79 yo.  Completed   Zoster (Shingles) Vaccine  Completed   HPV Vaccine  Aged Out   Other meds in chart not specifically noted in last  PCP f/up OV Muscle relaxers - tizanidine Rx from January  PPI-pantoprozole - old med on list, pepcid old 2020 med as well GERD:  Albuterol inhaler Rx from July 2023: Allergy meds and dx of reactive airway  Additional hx below of cardiac disease, valvular diease and heart failure with multiple family members with cardiac hx, also HLD Lab Results  Component Value Date   CHOL 150 03/20/2022   HDL 59 03/20/2022   LDLCALC 71 03/20/2022   TRIG 114 03/20/2022   CHOLHDL 2.5 03/20/2022  Not on statin, managed with diet/lifestyle   Lab Results  Component Value Date   HGBA1C 5.7 (H) 03/20/2022       Current Outpatient Medications:    albuterol (VENTOLIN HFA) 108 (90 Base) MCG/ACT inhaler, Inhale 2 puffs into the lungs every 4 (four) hours as needed for wheezing or shortness of breath., Disp: 18 g, Rfl: 1   aspirin EC 81 MG tablet, Take 81 mg by mouth daily., Disp: , Rfl:    Calcium Carbonate-Vitamin D 600-400 MG-UNIT tablet, Take 1 tablet by mouth daily. , Disp: , Rfl:    celecoxib (CELEBREX) 100 MG capsule, Take 1 capsule (100 mg total) by mouth 2 (two) times daily., Disp: 90 capsule, Rfl: 0   docusate sodium (COLACE) 250 MG capsule, Take 250 mg by mouth daily., Disp: , Rfl:  famotidine (PEPCID) 20 MG tablet, Take 1 tablet (20 mg total) by mouth 2 (two) times daily as needed for heartburn or indigestion., Disp: 60 tablet, Rfl: 2   fluticasone (FLONASE) 50 MCG/ACT nasal spray, Place 2 sprays into both nostrils daily., Disp: 16 g, Rfl: 6   losartan (COZAAR) 50 MG tablet, Take 1 tablet (50 mg total) by mouth daily., Disp: 90 tablet, Rfl: 0   MULTIPLE VITAMIN PO, Take 1 tablet by mouth daily. , Disp: , Rfl:    pantoprazole (PROTONIX) 40 MG tablet, Take 1 tablet (40 mg total) by mouth daily as needed., Disp: 30 tablet, Rfl: 1   psyllium (METAMUCIL) 0.52 g capsule, Take 0.52 g by mouth daily., Disp: , Rfl:    tiZANidine (ZANAFLEX) 2 MG tablet, Take 1 tablet (2 mg total) by mouth every 8  (eight) hours as needed for muscle spasms., Disp: 30 tablet, Rfl: 0   TRI-LUMA 0.01-4-0.05 % CREA, SMARTSIG:1 Sparingly Topical Every Night, Disp: , Rfl:    triamcinolone ointment (KENALOG) 0.5 %, Apply 1 Application topically 2 (two) times daily., Disp: 30 g, Rfl: 0   TURMERIC PO, Take 1 tablet by mouth daily. 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   Elevated C-reactive protein (CRP) 02/14/2018   Elevated antinuclear antibody (ANA) level 02/14/2018   First degree AV block 09/17/2017   Left atrial enlargement 09/17/2017   Aortic regurgitation 06/17/2016   Mitral regurgitation 34/19/3790   Diastolic dysfunction 24/08/7352   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   Gastroesophageal reflux disease 05/24/2015   Hypertension goal BP (blood pressure) < 150/90 05/24/2015   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    Past Surgical History:  Procedure Laterality Date   ABDOMINAL HYSTERECTOMY     COLONOSCOPY WITH PROPOFOL N/A 10/01/2015   Procedure: COLONOSCOPY WITH PROPOFOL;  Surgeon: Lollie Sails, MD;  Location: [facility] ENDOSCOPY;  Service: Endoscopy;  Laterality: N/A;   ESOPHAGOGASTRODUODENOSCOPY (EGD) WITH PROPOFOL N/A 10/01/2015   Procedure: ESOPHAGOGASTRODUODENOSCOPY (EGD) WITH PROPOFOL;  Surgeon: Lollie Sails, MD;  Location: [facility] ENDOSCOPY;  Service: Endoscopy;  Laterality: N/A;    Family History  Problem Relation Age of Onset   Heart disease Mother    Diabetes Mother    Aneurysm Mother    Heart disease Father    Heart attack Father    Heart disease Sister    Diabetes Sister    Pneumonia Sister    Heart disease Brother    Diabetes Brother    Diabetes Sister    Heart disease Sister    Congestive Heart  Failure Sister    Breast cancer Neg Hx     Social History   Tobacco Use   Smoking status: Never   Smokeless tobacco: Never   Tobacco comments:    smoking cessation materials not required  Vaping Use   Vaping Use: Never used  Substance Use Topics   Alcohol use: Not Currently    Alcohol/week: 0.0 standard drinks of alcohol   Drug use: No     Allergies  Allergen Reactions   Nitrofurantoin Monohyd Macro     Health Maintenance  Topic Date Due   COVID-19 Vaccine (5 - Pfizer series) 10/21/2021   MAMMOGRAM  10/03/2022   TETANUS/TDAP  05/23/2025  COLONOSCOPY (Pts 45-34yr Insurance coverage will need to be confirmed)  09/30/2025   Pneumonia Vaccine 76 Years old  Completed   INFLUENZA VACCINE  Completed   DEXA SCAN  Completed   Hepatitis C Screening  Completed   Zoster Vaccines- Shingrix  Completed   HPV VACCINES  Aged Out    Chart Review Today: I personally reviewed active problem list, medication list, allergies, family history, social history, health maintenance, notes from last encounter, lab results, imaging with the patient/caregiver today.   Review of Systems  Constitutional: Negative.   HENT: Negative.    Eyes: Negative.   Respiratory: Negative.    Cardiovascular: Negative.   Gastrointestinal: Negative.   Endocrine: Negative.   Genitourinary: Negative.   Musculoskeletal: Negative.   Skin: Negative.   Allergic/Immunologic: Negative.   Neurological: Negative.   Hematological: Negative.   Psychiatric/Behavioral: Negative.    All other systems reviewed and are negative.    Objective:   Vitals:   09/16/22 1352  BP: (!) 144/70  Pulse: 82  Resp: 16  Temp: 97.8 F (36.6 C)  TempSrc: Oral  SpO2: 100%  Weight: 206 lb 8 oz (93.7 kg)  Height: 5' 9.5" (1.765 m)    Body mass index is 30.06 kg/m.  Physical Exam Vitals and nursing note reviewed.  Constitutional:      General: She is not in acute distress.    Appearance: Normal appearance. She is  well-developed, well-groomed and overweight. She is not ill-appearing, toxic-appearing or diaphoretic.     Comments: Appears stated age, NAD  HENT:     Head: Normocephalic and atraumatic.     Right Ear: Tympanic membrane, ear canal and external ear normal. There is no impacted cerumen.     Left Ear: Tympanic membrane, ear canal and external ear normal. There is no impacted cerumen.     Nose: Nose normal. No congestion.     Mouth/Throat:     Mouth: Mucous membranes are moist.     Pharynx: Oropharynx is clear. No oropharyngeal exudate.  Eyes:     General: Lids are normal. No scleral icterus.       Right eye: No discharge.        Left eye: No discharge.     Conjunctiva/sclera: Conjunctivae normal.  Neck:     Trachea: Phonation normal. No tracheal deviation.  Cardiovascular:     Rate and Rhythm: Normal rate and regular rhythm.     Pulses: Normal pulses.          Radial pulses are 2+ on the right side and 2+ on the left side.       Posterior tibial pulses are 2+ on the right side and 2+ on the left side.     Heart sounds: Normal heart sounds. No murmur heard.    No friction rub. No gallop.  Pulmonary:     Effort: Pulmonary effort is normal. No respiratory distress.     Breath sounds: Normal breath sounds. No stridor. No wheezing, rhonchi or rales.  Chest:     Chest wall: No tenderness.  Abdominal:     General: Bowel sounds are normal. There is no distension.     Palpations: Abdomen is soft. There is no hepatomegaly, splenomegaly, mass or pulsatile mass.     Tenderness: There is abdominal tenderness in the epigastric area. There is no right CVA tenderness, left CVA tenderness, guarding or rebound. Negative signs include Murphy's sign.  Musculoskeletal:     Right lower leg: No edema.  Left lower leg: No edema.  Skin:    General: Skin is warm and dry.     Coloration: Skin is not jaundiced or pale.     Findings: Rash present.  Neurological:     Mental Status: She is alert. Mental  status is at baseline.     Motor: No abnormal muscle tone.     Gait: Gait normal.  Psychiatric:        Mood and Affect: Mood normal.        Speech: Speech normal.        Behavior: Behavior normal. Behavior is cooperative.         Assessment & Plan:   Problem List Items Addressed This Visit       Cardiovascular and Mediastinum   Hypertension goal BP (blood pressure) < 150/90 - Primary    BP not well controlled today, she refused retake due to not taking meds this am BP Readings from Last 3 Encounters:  09/16/22 (!) 144/70  05/22/22 140/82  03/20/22 134/76  Losartan increased, her BP at baseline could tolerate the dose increase      Relevant Medications   losartan (COZAAR) 100 MG tablet   Other Relevant Orders   COMPLETE METABOLIC PANEL WITH GFR (Completed)     Respiratory   Reactive airways dysfunction syndrome (HCC)    Refills sent in         Digestive   Gastroesophageal reflux disease    Sx similar to prior episodes which responded to PPI She has some decreased appetite, early fullness, some flatus and mild indigestion but she denies abd pain, reflux of acid, N, V, dysphagia On exam she has epigastric tenderness PPI daily for 2-4 weeks, pt encouraged to f/up in 1-2 month for recheck      Relevant Medications   pantoprazole (PROTONIX) 40 MG tablet   Other Visit Diagnoses     Intertriginous candidiasis       rash to breast skin folds, some raw areas, erythematous, instructions to air out, try powders, antifungals, stop steroids for a few weeks   Relevant Medications   nystatin (MYCOSTATIN/NYSTOP) powder   Decreased appetite       suspect some upper GI etiology, see plan per GERD   Relevant Orders   CBC with Differential/Platelet (Completed)   COMPLETE METABOLIC PANEL WITH GFR (Completed)   Prediabetes       recheck labs   Relevant Orders   COMPLETE METABOLIC PANEL WITH GFR (Completed)   Hemoglobin A1c (Completed)   Pain of upper abdomen       mildly  tender on exam, no dysphagia or vomiting minimal weight loss, PPI and f/up in a month if not improving   Relevant Orders   CBC with Differential/Platelet (Completed)   COMPLETE METABOLIC PANEL WITH GFR (Completed)   Weight loss       decreased appetite/early fullness, suspect GERD, PPI sent in, recheck sx and weight in 1-2 months   Relevant Orders   CBC with Differential/Platelet (Completed)   COMPLETE METABOLIC PANEL WITH GFR (Completed)   Hemoglobin A1c (Completed)   TSH (Completed)        Return in about 2 months (around 11/16/2022) for weigh/GI symptoms and BP recheck .   Delsa Grana, PA-C 09/16/22 2:02 PM

## 2022-09-16 NOTE — Patient Instructions (Signed)
BP goal is <130/80  Use the prescription powder to skin areas  See the info below  If you want to use steroids I recommend trying combo steroid and antifungal  Miconazole, ketoconazole   Intertrigo Intertrigo is skin irritation (inflammation) that happens in warm, moist areas of the body. The irritation can cause a rash and make skin raw and itchy. The rash is usually pink or red. It happens mostly between folds of skin or where skin rubs together, such as: Between the toes. In the armpits. In the groin area. Under the belly. Under the breasts. Around the butt area. This condition is not passed from person to person (is not contagious). What are the causes? Heat, moisture, rubbing, and not enough air movement. The condition can be made worse by: Sweat. Bacteria. A fungus, such as yeast. What increases the risk? Moisture in your skin folds. You are more likely to develop this condition if you: Have diabetes. Are overweight. Are not able to move around. Live in a warm and moist climate. Wear splints, braces, or other medical devices. Are not able to control your pee (urine) or poop (stool). What are the signs or symptoms? A pink or red skin rash in the skin fold or near the skin fold. Raw or scaly skin. Itching. A burning feeling. Bleeding. Leaking fluid. A bad smell. How is this treated? Cleaning and drying your skin. Taking an antibiotic medicine or using an antibiotic skin cream for a bacterial infection. Using an antifungal cream on your skin or taking pills for an infection that was caused by a fungus, such as yeast. Using a steroid ointment to stop the itching and irritation. Separating the skin fold with a clean cotton cloth to absorb moisture and allow air to flow into the area. Follow these instructions at home: Keep the affected area clean and dry. Do not scratch your skin. Stay cool as much as you can. Use an air conditioner or a fan, if you have one. Apply  over-the-counter and prescription medicines only as told by your doctor. If you were prescribed an antibiotic medicine, use it as told by your doctor. Do not stop using the antibiotic even if your condition starts to get better. Keep all follow-up visits as told by your doctor. This is important. How is this prevented?  Stay at a healthy weight. Take care of your feet. This is very important if you have diabetes. You should: Wear shoes that fit well. Keep your feet dry. Wear clean cotton or wool socks. Protect the skin in your groin and butt area as told by your doctor. To do this: Follow a regular cleaning routine. Use creams, powders, or ointments that protect your skin. Change protection pads often. Do not wear tight clothes. Wear clothes that: Are loose. Take moisture away from your body. Are made of cotton. Wear a bra that gives good support, if needed. Shower and dry yourself well after being active. Use a hair dryer on a cool setting to dry between skin folds. Keep your blood sugar under control if you have diabetes. Contact a doctor if: Your symptoms do not get better with treatment. Your symptoms get worse or they spread. You notice more redness and warmth. You have a fever. Summary Intertrigo is skin irritation that occurs when folds of skin rub together. This condition is caused by heat, moisture, and rubbing. This condition may be treated by cleaning and drying your skin and with medicines. Apply over-the-counter and prescription medicines only as  told by your doctor. Keep all follow-up visits as told by your doctor. This is important. This information is not intended to replace advice given to you by your health care provider. Make sure you discuss any questions you have with your health care provider. Document Revised: 02/04/2022 Document Reviewed: 09/08/2021 Elsevier Patient Education  [place].

## 2022-09-17 LAB — CBC WITH DIFFERENTIAL/PLATELET
Absolute Monocytes: 359 cells/uL (ref 200–950)
Basophils Absolute: 19 cells/uL (ref 0–200)
Basophils Relative: 0.3 %
Eosinophils Absolute: 120 cells/uL (ref 15–500)
Eosinophils Relative: 1.9 %
HCT: 39 % (ref 35.0–45.0)
Hemoglobin: 12.8 g/dL (ref 11.7–15.5)
Lymphs Abs: 2646 cells/uL (ref 850–3900)
MCH: 26.2 pg — ABNORMAL LOW (ref 27.0–33.0)
MCHC: 32.8 g/dL (ref 32.0–36.0)
MCV: 79.8 fL — ABNORMAL LOW (ref 80.0–100.0)
MPV: 10.7 fL (ref 7.5–12.5)
Monocytes Relative: 5.7 %
Neutro Abs: 3156 cells/uL (ref 1500–7800)
Neutrophils Relative %: 50.1 %
Platelets: 313 10*3/uL (ref 140–400)
RBC: 4.89 10*6/uL (ref 3.80–5.10)
RDW: 14.1 % (ref 11.0–15.0)
Total Lymphocyte: 42 %
WBC: 6.3 10*3/uL (ref 3.8–10.8)

## 2022-09-17 LAB — HEMOGLOBIN A1C
Hgb A1c MFr Bld: 5.7 % of total Hgb — ABNORMAL HIGH (ref <5.7)
Mean Plasma Glucose: 117 mg/dL
eAG (mmol/L): 6.5 mmol/L

## 2022-09-17 LAB — COMPLETE METABOLIC PANEL WITH GFR
AG Ratio: 1.4 (calc) (ref 1.0–2.5)
ALT: 9 U/L (ref 6–29)
AST: 14 U/L (ref 10–35)
Albumin: 4.1 g/dL (ref 3.6–5.1)
Alkaline phosphatase (APISO): 93 U/L (ref 37–153)
BUN: 14 mg/dL (ref 7–25)
CO2: 27 mmol/L (ref 20–32)
Calcium: 9.5 mg/dL (ref 8.6–10.4)
Chloride: 107 mmol/L (ref 98–110)
Creat: 0.76 mg/dL (ref 0.60–1.00)
Globulin: 3 g/dL (calc) (ref 1.9–3.7)
Glucose, Bld: 86 mg/dL (ref 65–99)
Potassium: 4.4 mmol/L (ref 3.5–5.3)
Sodium: 142 mmol/L (ref 135–146)
Total Bilirubin: 0.5 mg/dL (ref 0.2–1.2)
Total Protein: 7.1 g/dL (ref 6.1–8.1)
eGFR: 82 mL/min/{1.73_m2} (ref >=60)

## 2022-09-17 LAB — TSH: TSH: 1.14 mIU/L (ref 0.40–4.50)

## 2022-09-18 ENCOUNTER — Other Ambulatory Visit: Payer: Self-pay | Admitting: Internal Medicine

## 2022-09-18 ENCOUNTER — Telehealth: Payer: Self-pay | Admitting: Internal Medicine

## 2022-09-18 DIAGNOSIS — I10 Essential (primary) hypertension: Secondary | ICD-10-CM

## 2022-09-18 MED ORDER — ADULT BLOOD PRESSURE CUFF LG KIT: 1.0000 | PACK | Freq: Every day | 0 refills | Status: DC

## 2022-09-18 NOTE — Telephone Encounter (Signed)
Copied from [place] [id number]. Topic: General - Inquiry >> Sep 18, 2022  2:17 PM Erskine Squibb wrote: Reason for CRM: The patient called in inquiring if there is any way her provider can prescribe her a blood pressure cuff as she says hers is no good. Please assist patient further

## 2022-09-21 MED ORDER — LOSARTAN POTASSIUM 100 MG PO TABS: 100.0000 mg | ORAL_TABLET | Freq: Every day | ORAL | 1 refills | Status: DC

## 2022-09-21 NOTE — Assessment & Plan Note (Signed)
BP not well controlled today, she refused retake due to not taking meds this am BP Readings from Last 3 Encounters:  09/16/22 (!) 144/70  05/22/22 140/82  03/20/22 134/76  Losartan increased, her BP at baseline could tolerate the dose increase

## 2022-09-21 NOTE — Telephone Encounter (Signed)
Pt.notified

## 2022-09-21 NOTE — Assessment & Plan Note (Signed)
Refills sent in

## 2022-09-21 NOTE — Assessment & Plan Note (Signed)
Sx similar to prior episodes which responded to PPI She has some decreased appetite, early fullness, some flatus and mild indigestion but she denies abd pain, reflux of acid, N, V, dysphagia On exam she has epigastric tenderness PPI daily for 2-4 weeks, pt encouraged to f/up in 1-2 month for recheck

## 2022-10-05 ENCOUNTER — Other Ambulatory Visit: Payer: Self-pay | Admitting: Internal Medicine

## 2022-10-05 DIAGNOSIS — I10 Essential (primary) hypertension: Secondary | ICD-10-CM

## 2022-10-05 MED ORDER — ADULT BLOOD PRESSURE CUFF LG KIT: 1.0000 | PACK | Freq: Every day | 0 refills | Status: DC

## 2022-10-05 NOTE — Telephone Encounter (Signed)
Pt states [facility] told her they do not take scripts for BP cuff, you have to come in and purchase one. Pt would like to know if you can send to   [place], [place]  That is her [employer]. Can you call her if any issues and let her know something.

## 2022-10-09 ENCOUNTER — Ambulatory Visit
Admission: RE | Admit: 2022-10-09 | Discharge: 2022-10-09 | Disposition: A | Discharge status: Home / Self Care | Payer: Medicare PPO | Source: Ambulatory Visit | Attending: Family Medicine | Admitting: Family Medicine

## 2022-10-09 DIAGNOSIS — Z1231 Encounter for screening mammogram for malignant neoplasm of breast: Secondary | ICD-10-CM | POA: Diagnosis not present

## 2022-10-13 ENCOUNTER — Ambulatory Visit (INDEPENDENT_AMBULATORY_CARE_PROVIDER_SITE_OTHER): Payer: Medicare PPO | Admitting: Family Medicine

## 2022-10-13 ENCOUNTER — Encounter: Payer: Self-pay | Admitting: Family Medicine

## 2022-10-13 VITALS — BP 148/80 | HR 76 | Temp 98.1°F | Resp 16 | Ht 69.5 in | Wt 208.1 lb

## 2022-10-13 DIAGNOSIS — I10 Essential (primary) hypertension: Secondary | ICD-10-CM

## 2022-10-13 DIAGNOSIS — K219 Gastro-esophageal reflux disease without esophagitis: Secondary | ICD-10-CM

## 2022-10-13 DIAGNOSIS — R630 Anorexia: Secondary | ICD-10-CM

## 2022-10-13 DIAGNOSIS — R634 Abnormal weight loss: Secondary | ICD-10-CM | POA: Diagnosis not present

## 2022-10-13 DIAGNOSIS — B372 Candidiasis of skin and nail: Secondary | ICD-10-CM | POA: Diagnosis not present

## 2022-10-13 MED ORDER — FLUCONAZOLE 150 MG PO TABS: 150.0000 mg | ORAL_TABLET | ORAL | 0 refills | Status: AC

## 2022-10-13 MED ORDER — LOSARTAN POTASSIUM 100 MG PO TABS: 100.0000 mg | ORAL_TABLET | Freq: Every day | ORAL | 0 refills | Status: DC

## 2022-10-13 NOTE — Progress Notes (Signed)
Name: Ruth Bishop   MRN: [id number]    DOB: 1946-02-07   Date:10/13/2022       Progress Note  Chief Complaint  Patient presents with   Follow-up   Hypertension     Subjective:   Ruth Bishop is a 76 y.o. female, presents to clinic for f/up on HTN, GERD/abd pain and weight loss  Last OV was about a month ago BP was elevated, had not taken meds that day, did not allow [place] to recheck BP Increased losartan dose since last 2 OV BP high and could tolerate increasing to 100 Here for recheck today. BP Readings from Last 3 Encounters:  10/13/22 (!) 148/80  09/16/22 (!) 144/70  05/22/22 140/82  Prior cardiac hx, looks like last ordered ECHO in 2019 was not completed 03/2018 Prior ECHO from 10/2017 reviewed: Aortic valve insufficiency, etiology of cardiac valve disease unspecified [I35.1 (ICD-10-CM)], Mitral valve insufficiency, unspecified etiology [I34.0 (ICD-10-CM)], First degree AV block [I44.0 (ICD-10-CM)], Left atrial enlargement [I51.7 (ICD-10-CM)]  Study Conclusions   - Left ventricle: The cavity size was normal. Systolic function was    normal. The estimated ejection fraction was in the range of 60%    to 65%. Wall motion was normal; there were no regional wall    motion abnormalities. Doppler parameters are consistent with    abnormal left ventricular relaxation (grade 1 diastolic    dysfunction).  - Aortic valve: There was mild regurgitation.  - Left atrium: The atrium was normal in size.  - Right ventricle: Systolic function was normal.  - Pulmonary arteries: Systolic pressure was within the normal    range.   Looks like it was managed by PCP Dr. Sanda Klein at the time, recently following with Dr. Rockey Situ - cardiology last [place] 05/2022    GERD/abd pain/decreased appetite She was prescribed protonix and could use pepcid bid as needed Sx are better  Weight loss - she was concerned about it last OV, but also happy about it after discussing watching weight No further weight  loss  Wt Readings from Last 5 Encounters:  10/13/22 208 lb 1.6 oz (94.4 kg)  09/16/22 206 lb 8 oz (93.7 kg)  05/22/22 215 lb (97.5 kg)  03/20/22 215 lb 1.6 oz (97.6 kg)  02/13/22 215 lb 14.4 oz (97.9 kg)   BMI Readings from Last 5 Encounters:  10/13/22 30.29 kg/m  09/16/22 30.06 kg/m  05/22/22 31.29 kg/m  03/20/22 30.86 kg/m  02/13/22 30.98 kg/m        Current Outpatient Medications:    albuterol (VENTOLIN HFA) 108 (90 Base) MCG/ACT inhaler, Inhale 2 puffs into the lungs every 4 (four) hours as needed for wheezing or shortness of breath., Disp: 18 g, Rfl: 1   aspirin EC 81 MG tablet, Take 81 mg by mouth daily., Disp: , Rfl:    Blood Pressure Monitoring (ADULT BLOOD PRESSURE CUFF LG) KIT, 1 each by Does not apply route daily., Disp: 1 kit, Rfl: 0   Calcium Carbonate-Vitamin D 600-400 MG-UNIT tablet, Take 1 tablet by mouth daily. , Disp: , Rfl:    docusate sodium (COLACE) 250 MG capsule, Take 250 mg by mouth daily., Disp: , Rfl:    famotidine (PEPCID) 20 MG tablet, Take 1 tablet (20 mg total) by mouth 2 (two) times daily as needed for heartburn or indigestion., Disp: 60 tablet, Rfl: 2   fluticasone (FLONASE) 50 MCG/ACT nasal spray, Place 2 sprays into both nostrils daily., Disp: 16 g, Rfl: 6   losartan (COZAAR) 100 MG  tablet, Take 1 tablet (100 mg total) by mouth daily., Disp: 90 tablet, Rfl: 1   MULTIPLE VITAMIN PO, Take 1 tablet by mouth daily. , Disp: , Rfl:    nystatin (MYCOSTATIN/NYSTOP) powder, Apply 1 Application topically 3 (three) times daily., Disp: 30 g, Rfl: 2   pantoprazole (PROTONIX) 40 MG tablet, Take 1 tablet (40 mg total) by mouth daily., Disp: 30 tablet, Rfl: 1   psyllium (METAMUCIL) 0.52 g capsule, Take 0.52 g by mouth daily., Disp: , Rfl:    tiZANidine (ZANAFLEX) 2 MG tablet, Take 1 tablet (2 mg total) by mouth every 8 (eight) hours as needed for muscle spasms., Disp: 30 tablet, Rfl: 0   TRI-LUMA 0.01-4-0.05 % CREA, SMARTSIG:1 Sparingly Topical Every Night,  Disp: , Rfl:    triamcinolone ointment (KENALOG) 0.5 %, Apply 1 Application topically 2 (two) times daily., Disp: 30 g, Rfl: 0   TURMERIC PO, Take 1 tablet by mouth daily. 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   Elevated C-reactive protein (CRP) 02/14/2018   Elevated antinuclear antibody (ANA) level 02/14/2018   First degree AV block 09/17/2017   Left atrial enlargement 09/17/2017   Aortic regurgitation 06/17/2016   Mitral regurgitation 55/73/2202   Diastolic dysfunction 54/27/0623   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   Gastroesophageal reflux disease 05/24/2015   Hypertension goal BP (blood pressure) < 150/90 05/24/2015   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    Past Surgical History:  Procedure Laterality Date   ABDOMINAL HYSTERECTOMY     COLONOSCOPY WITH PROPOFOL N/A 10/01/2015   Procedure: COLONOSCOPY WITH PROPOFOL;  Surgeon: Lollie Sails, MD;  Location: [facility] END[facility]COPY;  Service: Endoscopy;  Laterality: N/A;   ESOPHAGOGASTRODUODENOSCOPY (EGD) WITH PROPOFOL N/A 10/01/2015   Procedure: ESOPHAGOGASTRODUODENOSCOPY (EGD) WITH PROPOFOL;  Surgeon: Lollie Sails, MD;  Location: [facility] ENDOSCOPY;  Service: Endoscopy;  Laterality: N/A;    Family History  Problem Relation Age of Onset   Heart disease Mother    Diabetes Mother    Aneurysm Mother    Heart disease Father    Heart attack Father    Heart disease Sister    Diabetes Sister    Pneumonia Sister    Heart disease Brother    Diabetes Brother    Diabetes Sister    Heart disease Sister    Congestive Heart Failure Sister    Breast cancer Neg Hx     Social History   Tobacco Use   Smoking status: Never   Smokeless tobacco: Never   Tobacco  comments:    smoking cessation materials not required  Vaping Use   Vaping Use: Never used  Substance Use Topics   Alcohol use: Not Currently    Alcohol/week: 0.0 standard drinks of alcohol   Drug use: No     Allergies  Allergen Reactions   Nitrofurantoin Monohyd Macro     Health Maintenance  Topic Date Due   COVID-19 Vaccine (5 - Pfizer series) 10/21/2021   Medicare Annual Wellness (AWV)  10/14/2022   MAMMOGRAM  10/10/2023   TETANUS/TDAP  05/23/2025   COLONOSCOPY (Pts 45-48yr Insurance coverage will need to be confirmed)  09/30/2025   Pneumonia Vaccine 76 Years old  Completed   INFLUENZA VACCINE  Completed   DEXA  SCAN  Completed   Hepatitis C Screening  Completed   Zoster Vaccines- Shingrix  Completed   HPV VACCINES  Aged Out    Chart Review Today: I personally reviewed active problem list, medication list, allergies, family history, social history, health maintenance, notes from last encounter, lab results, imaging with the patient/caregiver today.   Review of Systems  Constitutional: Negative.   HENT: Negative.    Eyes: Negative.   Respiratory: Negative.    Cardiovascular: Negative.   Gastrointestinal: Negative.   Endocrine: Negative.   Genitourinary: Negative.   Musculoskeletal: Negative.   Skin: Negative.   Allergic/Immunologic: Negative.   Neurological: Negative.   Hematological: Negative.   Psychiatric/Behavioral: Negative.    All other systems reviewed and are negative.    Objective:   Vitals:   10/13/22 1145 10/13/22 1146  BP: (!) 150/74 (!) 148/80  Pulse: 76   Resp: 16   Temp: 98.1 F (36.7 C)   TempSrc: Oral   SpO2: 99%   Weight: 208 lb 1.6 oz (94.4 kg)   Height: 5' 9.5" (1.765 m)     Body mass index is 30.29 kg/m.  Physical Exam Vitals and nursing note reviewed.  Constitutional:      General: She is not in acute distress.    Appearance: Normal appearance. She is well-developed, well-groomed and overweight. She is not  ill-appearing, toxic-appearing or diaphoretic.     Comments: Appears stated age, NAD  HENT:     Head: Normocephalic and atraumatic.     Right Ear: Tympanic membrane, ear canal and external ear normal. There is no impacted cerumen.     Left Ear: Tympanic membrane, ear canal and external ear normal. There is no impacted cerumen.     Nose: Nose normal. No congestion.     Mouth/Throat:     Mouth: Mucous membranes are moist.     Pharynx: Oropharynx is clear. No oropharyngeal exudate.  Eyes:     General: Lids are normal. No scleral icterus.       Right eye: No discharge.        Left eye: No discharge.     Conjunctiva/sclera: Conjunctivae normal.  Neck:     Trachea: Phonation normal. No tracheal deviation.  Cardiovascular:     Rate and Rhythm: Normal rate and regular rhythm.     Pulses: Normal pulses.          Radial pulses are 2+ on the right side and 2+ on the left side.       Posterior tibial pulses are 2+ on the right side and 2+ on the left side.     Heart sounds: Normal heart sounds. No murmur heard.    No friction rub. No gallop.  Pulmonary:     Effort: Pulmonary effort is normal. No respiratory distress.     Breath sounds: Normal breath sounds. No stridor. No wheezing, rhonchi or rales.  Chest:     Chest wall: No tenderness.  Abdominal:     General: Bowel sounds are normal. There is no distension.     Palpations: Abdomen is soft. There is no hepatomegaly, splenomegaly, mass or pulsatile mass.     Tenderness: There is no abdominal tenderness. There is no right CVA tenderness, left CVA tenderness, guarding or rebound.  Musculoskeletal:     Right lower leg: No edema.     Left lower leg: No edema.  Skin:    General: Skin is warm and dry.     Coloration: Skin is not jaundiced  or pale.     Findings: Rash present.  Neurological:     Mental Status: She is alert. Mental status is at baseline.     Motor: No abnormal muscle tone.     Gait: Gait normal.  Psychiatric:        Mood and  Affect: Mood normal.        Speech: Speech normal.        Behavior: Behavior normal. Behavior is cooperative.         Assessment & Plan:   Problem List Items Addressed This Visit       Cardiovascular and Mediastinum   Hypertension goal BP (blood pressure) < 150/90 - Primary    Bp still a little elevated, she is more anxious about coming knowing that BP was high and we are going to recheck bp Recommend monitoring at home Continue losartan same dose for now F/up with home readings to see how her resting BP average is at home when relaxed and out of clinic      Relevant Medications   losartan (COZAAR) 100 MG tablet     Digestive   Gastroesophageal reflux disease    Symptoms have improved, no more abd pain or weight loss, can wean off ppi      Other Visit Diagnoses     Decreased appetite       still not back to normal, but she is not having LACK of appetite, just eating smaller portions - weight stable   Weight loss       no further weight loss, up 2 lbs, monitoring   Intertriginous candidiasis       rash not improved with OTC meds, discussed combo antifungal and steroids, keeping the area dry, may add oral diflucan weekly x 4   Relevant Medications   fluconazole (DIFLUCAN) 150 MG tablet        Return for f/up OV in 3-4 weeks for BP med recheck .   Delsa Grana, PA-C 10/13/22 11:49 AM

## 2022-10-19 ENCOUNTER — Encounter: Payer: Self-pay | Admitting: Family Medicine

## 2022-10-19 NOTE — Assessment & Plan Note (Signed)
Symptoms have improved, no more abd pain or weight loss, can wean off ppi

## 2022-10-19 NOTE — Assessment & Plan Note (Signed)
Bp still a little elevated, she is more anxious about coming knowing that BP was high and we are going to recheck bp Recommend monitoring at home Continue losartan same dose for now F/up with home readings to see how her resting BP average is at home when relaxed and out of clinic

## 2022-10-19 NOTE — Patient Instructions (Signed)
Intertrigo Intertrigo is skin irritation (inflammation) that happens in warm, moist areas of the body. The irritation can cause a rash and make skin raw and itchy. The rash is usually pink or red. It happens mostly between folds of skin or where skin rubs together, such as: In the armpits. Under the breasts. Under the belly. In the groin area. Around the butt area. Between the toes. This condition is not passed from person to person. What are the causes? Heat, moisture, rubbing, and not enough air movement. The condition can be made worse by: Sweat. Bacteria. A fungus, such as yeast. What increases the risk? Moisture in your skin folds. You are more likely to develop this condition if you: Are not able to move around. Live in a warm and moist climate. Are not able to control your pee (urine) or poop (stool). Wear splints, braces, or other medical devices. Are overweight. Have diabetes. What are the signs or symptoms? A pink or red skin rash in a skin fold or near a skin fold. Raw or scaly skin. Itching. A burning feeling. Bleeding. Leaking fluid. A bad smell. How is this treated? Cleaning and drying your skin. Taking an antibiotic medicine or using an antibiotic skin cream for a bacterial infection. Using an antifungal cream on your skin or taking pills for an infection that was caused by a fungus, such as yeast. Using a steroid ointment to stop the itching and irritation. Separating the skin fold with a clean cotton cloth to absorb moisture and allow air to flow into the area. Follow these instructions at home: Keep the affected area clean and dry. Do not scratch your skin. Stay cool as much as you can. Use an air conditioner or a fan, if you have one. Apply over-the-counter and prescription medicines only as told by your doctor. If you were prescribed antibiotics, use them as told by your doctor. Do not stop using the antibiotic even if you start to feel better. Keep all  follow-up visits. Your doctor may need to check your skin to make sure that the treatment is working. How is this prevented? Shower and dry yourself well after being active. Use a hair dryer on a cool setting to dry between skin folds. Do not wear tight clothes. Wear clothes that: Are loose. Take moisture away from your body. Are made of cotton. Wear a bra that gives good support, if needed. Protect the skin in your groin and butt area as told by your doctor. To do this: Follow a regular cleaning routine. Use creams, powders, or ointments that protect your skin. Change protection pads often. Stay at a healthy weight. Take care of your feet. This is very important if you have diabetes. You should: Wear shoes that fit well. Keep your feet dry. Wear clean cotton or wool socks. Keep your blood sugar under control if you have diabetes. Contact a doctor if: Your symptoms do not get better with treatment. Your symptoms get worse or they spread. You notice more redness and warmth. You have a fever. This information is not intended to replace advice given to you by your health care provider. Make sure you discuss any questions you have with your health care provider. Document Revised: 04/16/2022 Document Reviewed: 04/16/2022 Elsevier Patient Education  2023 [employer] Inc.  

## 2022-10-20 ENCOUNTER — Ambulatory Visit: Payer: Medicare PPO

## 2022-11-05 ENCOUNTER — Other Ambulatory Visit: Payer: Self-pay

## 2022-11-05 ENCOUNTER — Ambulatory Visit (INDEPENDENT_AMBULATORY_CARE_PROVIDER_SITE_OTHER): Payer: Medicare PPO | Admitting: Nurse Practitioner

## 2022-11-05 ENCOUNTER — Encounter: Payer: Self-pay | Admitting: Nurse Practitioner

## 2022-11-05 VITALS — BP 128/72 | HR 76 | Temp 98.3°F | Resp 16 | Ht 69.5 in | Wt 207.8 lb

## 2022-11-05 DIAGNOSIS — I10 Essential (primary) hypertension: Secondary | ICD-10-CM

## 2022-11-05 MED ORDER — LOSARTAN POTASSIUM 100 MG PO TABS: 100.0000 mg | ORAL_TABLET | Freq: Every day | ORAL | 0 refills | Status: DC

## 2022-11-05 NOTE — Progress Notes (Signed)
BP 128/72   Pulse 76   Temp 98.3 F (36.8 C) (Oral)   Resp 16   Ht 5' 9.5" (1.765 m)   Wt 207 lb 12.8 oz (94.3 kg)   SpO2 98%   BMI 30.25 kg/m    Subjective:    Patient ID: Ruth Bishop, female    DOB: 1946/02/29, 76 y.o.   MRN: [id number]  HPI: Ruth Bishop is a 76 y.o. female  Chief Complaint  Patient presents with   Hypertension   Hypertension:  patient was last seen on 10/13/2022 by Delsa Grana PA for HTN follow up.  Previous blood pressures: 10/13/22 (!) 148/80  09/16/22 (!) 144/70  05/22/22 140/82   Patient is currently on losartan 100 mg daily.  Her blood pressure today is 128/72.  She has been monitoring her blood pressure at home and readings include 170s. Her blood pressure was checked with her machine and a manual was also done and there was a significant difference. Recommend she get a new machine with an appropriate size cuff.  She denies any chest pain, shortness of breath, headaches or blurred vision.    Relevant past medical, surgical, family and social history reviewed and updated as indicated. Interim medical history since our last visit reviewed. Allergies and medications reviewed and updated.  Review of Systems  Constitutional: Negative for fever or weight change.  Respiratory: Negative for cough and shortness of breath.   Cardiovascular: Negative for chest pain or palpitations.  Gastrointestinal: Negative for abdominal pain, no bowel changes.  Musculoskeletal: Negative for gait problem or joint swelling.  Skin: Negative for rash.  Neurological: Negative for dizziness or headache.  No other specific complaints in a complete review of systems (except as listed in HPI above).      Objective:    BP 128/72   Pulse 76   Temp 98.3 F (36.8 C) (Oral)   Resp 16   Ht 5' 9.5" (1.765 m)   Wt 207 lb 12.8 oz (94.3 kg)   SpO2 98%   BMI 30.25 kg/m   Wt Readings from Last 3 Encounters:  11/05/22 207 lb 12.8 oz (94.3 kg)  10/13/22 208 lb 1.6 oz  (94.4 kg)  09/16/22 206 lb 8 oz (93.7 kg)    Physical Exam  Constitutional: Patient appears well-developed and well-nourished. Obese  No distress.  HEENT: head atraumatic, normocephalic, pupils equal and reactive to light, neck supple Cardiovascular: Normal rate, regular rhythm and normal heart sounds.  No murmur heard. No BLE edema. Pulmonary/Chest: Effort normal and breath sounds normal. No respiratory distress. Abdominal: Soft.  There is no tenderness. Psychiatric: Patient has a normal mood and affect. behavior is normal. Judgment and thought content normal.   Results for orders placed or performed in visit on 09/16/22  CBC with Differential/Platelet  Result Value Ref Range   WBC 6.3 3.8 - 10.8 Thousand/uL   RBC 4.89 3.80 - 5.10 Million/uL   Hemoglobin 12.8 11.7 - 15.5 g/dL   HCT 39.0 35.0 - 45.0 %   MCV 79.8 (L) 80.0 - 100.0 fL   MCH 26.2 (L) 27.0 - 33.0 pg   MCHC 32.8 32.0 - 36.0 g/dL   RDW 14.1 11.0 - 15.0 %   Platelets 313 140 - 400 Thousand/uL   MPV 10.7 7.5 - 12.5 fL   Neutro Abs 3,156 1,500 - 7,800 cells/uL   Lymphs Abs 2,646 850 - 3,900 cells/uL   Absolute Monocytes 359 200 - 950 cells/uL   Eosinophils Absolute 120 15 -  500 cells/uL   Basophils Absolute 19 0 - 200 cells/uL   Neutrophils Relative % 50.1 %   Total Lymphocyte 42.0 %   Monocytes Relative 5.7 %   Eosinophils Relative 1.9 %   Basophils Relative 0.3 %  COMPLETE METABOLIC PANEL WITH GFR  Result Value Ref Range   Glucose, Bld 86 65 - 99 mg/dL   BUN 14 7 - 25 mg/dL   Creat 0.76 0.60 - 1.00 mg/dL   eGFR 82 > OR = 60 mL/min/1.93m   BUN/Creatinine Ratio SEE NOTE: 6 - 22 (calc)   Sodium 142 135 - 146 mmol/L   Potassium 4.4 3.5 - 5.3 mmol/L   Chloride 107 98 - 110 mmol/L   CO2 27 20 - 32 mmol/L   Calcium 9.5 8.6 - 10.4 mg/dL   Total Protein 7.1 6.1 - 8.1 g/dL   Albumin 4.1 3.6 - 5.1 g/dL   Globulin 3.0 1.9 - 3.7 g/dL (calc)   AG Ratio 1.4 1.0 - 2.5 (calc)   Total Bilirubin 0.5 0.2 - 1.2 mg/dL    Alkaline phosphatase (APISO) 93 37 - 153 U/L   AST 14 10 - 35 U/L   ALT 9 6 - 29 U/L  Hemoglobin A1c  Result Value Ref Range   Hgb A1c MFr Bld 5.7 (H) <5.7 % of total Hgb   Mean Plasma Glucose 117 mg/dL   eAG (mmol/L) 6.5 mmol/L  TSH  Result Value Ref Range   TSH 1.14 0.40 - 4.50 mIU/L      Assessment & Plan:   Problem List Items Addressed This Visit       Cardiovascular and Mediastinum   Hypertension goal BP (blood pressure) < 150/90 - Primary    Blood pressure at goal today.  Continue taking losartan 100 mg daily.  Get a new blood pressure cuff.        Relevant Medications   losartan (COZAAR) 100 MG tablet     Follow up plan: Has appointment scheduled with Leisa next month

## 2022-11-05 NOTE — Assessment & Plan Note (Signed)
Blood pressure at goal today.  Continue taking losartan 100 mg daily.  Get a new blood pressure cuff.

## 2022-11-10 ENCOUNTER — Ambulatory Visit (INDEPENDENT_AMBULATORY_CARE_PROVIDER_SITE_OTHER): Payer: Medicare PPO | Admitting: Physician Assistant

## 2022-11-10 DIAGNOSIS — Z13820 Encounter for screening for osteoporosis: Secondary | ICD-10-CM

## 2022-11-10 DIAGNOSIS — Z0000 Encounter for general adult medical examination without abnormal findings: Secondary | ICD-10-CM | POA: Diagnosis not present

## 2022-11-10 NOTE — Progress Notes (Signed)
Virtual Visit via Telephone Note  I connected with [name]  on 11/10/22  at 1:00 pm today by telephone and verified that I am speaking with the correct person using two identifiers.  Location: Patient: at home, [place], [place]  Provider: [place], [place], [place] participating in the virtual visit: [place]   I discussed the limitations, risks, security and privacy concerns of performing an evaluation and management service by telephone and the availability of in person appointments. The patient expressed understanding and agreed to proceed.  Interactive audio and video telecommunications were attempted between this nurse and patient, however failed, due to patient having technical difficulties OR patient did not have access to video capability.  We continued and completed visit with audio only.  Some vital signs may be absent or patient reported.     Subjective:   Ruth Bishop is a 76 y.o. female who presents for Medicare Annual (Subsequent) preventive examination.  Review of Systems:         Objective:     Vitals: There were no vitals taken for this visit.  There is no height or weight on file to calculate BMI.     10/14/2021    1:44 PM 08/20/2020    9:56 AM 08/17/2019   10:40 AM 06/28/2019   10:59 AM 08/05/2018   12:59 PM 08/04/2017    2:09 PM 01/29/2017    2:09 PM  Advanced Directives  Does Patient Have a Medical Advance Directive? Yes Yes Yes No Yes Yes Yes  Type of Corporate treasurer of [place];Living will [place];Living will  [place];Living will    Copy of [place] in Chart?  No - copy requested No - copy requested  No - copy requested    Would patient like information on creating a medical advance directive?    No - Patient declined       Tobacco Social History   Tobacco Use  Smoking Status Never  Smokeless Tobacco Never   Tobacco Comments   smoking cessation materials not required     Counseling given: Not Answered Tobacco comments: smoking cessation materials not required   Clinical Intake:   Past Medical History:  Diagnosis Date   Abnormal chest x-ray 10/18/2018   Arthritis    Chronic pain 9/73/5329   Diastolic dysfunction 08/30/2682   GERD (gastroesophageal reflux disease)    Heart murmur    Hormone replacement therapy 05/26/2016   Hypertension    Hypertriglyceridemia 03/20/2022   Incidental lung nodule, > 3m and < 874m8/29/2018   4 mm ground glass nodule LLL incidentally noted on CT scan Nov 2016   Osteoarthritis of both knees 06/13/2015   [facility]    Post menopausal syndrome 05/24/2015   Skin-picking disorder 05/24/2015   when nervous/stressed chews on fingers and peels down skin, started with cuticles but now hypopigmentation of fingers down to palm in some areas with lifetime of skin picking.    Past Surgical History:  Procedure Laterality Date   ABDOMINAL HYSTERECTOMY     COLONOSCOPY WITH PROPOFOL N/A 10/01/2015   Procedure: COLONOSCOPY WITH PROPOFOL;  Surgeon: MaLollie SailsMD;  [facility];  Service: Endoscopy;  Laterality: N/A;   ESOPHAGOGASTRODUODENOSCOPY (EGD) WITH PROPOFOL N/A 10/01/2015   Procedure: ESOPHAGOGASTRODUODENOSCOPY (EGD) WITH PROPOFOL;  Surgeon: MaLollie SailsMD;  [facility];  Service: Endoscopy;  Laterality: N/A;   Family History  Problem Relation  Age of Onset   Heart disease Mother    Diabetes Mother    Aneurysm Mother    Heart disease Father    Heart attack Father    Heart disease Sister    Diabetes Sister    Pneumonia Sister    Heart disease Brother    Diabetes Brother    Diabetes Sister    Heart disease Sister    Congestive Heart Failure Sister    Breast cancer Neg Hx    Social History   Socioeconomic History   Marital status: Widowed    Spouse name: Not on file   Number of children: 1   Years of  education: Not on file   Highest education level: Master's degree (e.g., MA, MS, MEng, MEd, MSW, MBA)  Occupational History   Occupation: Retired  Tobacco Use   Smoking status: Never   Smokeless tobacco: Never   Tobacco comments:    smoking cessation materials not required  Vaping Use   Vaping Use: Never used  Substance and Sexual Activity   Alcohol use: Not Currently    Alcohol/week: 0.0 standard drinks of alcohol   Drug use: No   Sexual activity: Not Currently  Other Topics Concern   Not on file  Social History Narrative   Pt lives alone   Social Determinants of Health   Financial Resource Strain: Low Risk  (10/14/2021)   Overall Financial Resource Strain (CARDIA)    Difficulty of Paying Living Expenses: Not hard at all  Food Insecurity: No Food Insecurity (10/14/2021)   Hunger Vital Sign    Worried About Running Out of Food in the Last Year: Never true    [place] in the Last Year: Never true  Transportation Needs: No Transportation Needs (10/14/2021)   PRAPARE - Hydrologist (Medical): No    Lack of Transportation (Non-Medical): No  Physical Activity: Sufficiently Active (10/14/2021)   Exercise Vital Sign    Days of Exercise per Week: 4 days    Minutes of Exercise per Session: 90 min  Stress: No Stress Concern Present (10/14/2021)   [place]    Feeling of Stress : Not at all  Social Connections: Moderately Integrated (10/14/2021)   Social Connection and Isolation Panel [NHANES]    Frequency of Communication with Friends and Family: More than three times a week    Frequency of Social Gatherings with Friends and Family: More than three times a week    Attends Religious Services: More than 4 times per year    Active Member of [employer] or Organizations: Yes    Attends Archivist Meetings: More than 4 times per year    Marital Status: Widowed    Outpatient  Encounter Medications as of 11/10/2022  Medication Sig   albuterol (VENTOLIN HFA) 108 (90 Base) MCG/ACT inhaler Inhale 2 puffs into the lungs every 4 (four) hours as needed for wheezing or shortness of breath.   aspirin EC 81 MG tablet Take 81 mg by mouth daily.   Blood Pressure Monitoring (ADULT BLOOD PRESSURE CUFF LG) KIT 1 each by Does not apply route daily.   Calcium Carbonate-Vitamin D 600-400 MG-UNIT tablet Take 1 tablet by mouth daily.    docusate sodium (COLACE) 250 MG capsule Take 250 mg by mouth daily.   famotidine (PEPCID) 20 MG tablet Take 1 tablet (20 mg total) by mouth 2 (two) times daily as needed for heartburn or indigestion.  fluticasone (FLONASE) 50 MCG/ACT nasal spray Place 2 sprays into both nostrils daily.   losartan (COZAAR) 100 MG tablet Take 1 tablet (100 mg total) by mouth daily.   MULTIPLE VITAMIN PO Take 1 tablet by mouth daily.    nystatin (MYCOSTATIN/NYSTOP) powder Apply 1 Application topically 3 (three) times daily.   pantoprazole (PROTONIX) 40 MG tablet Take 1 tablet (40 mg total) by mouth daily.   psyllium (METAMUCIL) 0.52 g capsule Take 0.52 g by mouth daily.   tiZANidine (ZANAFLEX) 2 MG tablet Take 1 tablet (2 mg total) by mouth every 8 (eight) hours as needed for muscle spasms.   TRI-LUMA 0.01-4-0.05 % CREA SMARTSIG:1 Sparingly Topical Every Night   triamcinolone ointment (KENALOG) 0.5 % Apply 1 Application topically 2 (two) times daily.   TURMERIC PO Take 1 tablet by mouth daily.    No facility-administered encounter medications on file as of 11/10/2022.    Activities of Daily Living    11/10/2022   10:59 AM 11/05/2022   11:33 AM  In your present state of health, do you have any difficulty performing the following activities:  Hearing? 1 1  Vision? 0 0  Difficulty concentrating or making decisions? 0 0  Walking or climbing stairs? 0 0  Dressing or bathing? 0 0  Doing errands, shopping? 0 0    Patient Care Team: Steele Sizer, MD as PCP -  General (Family Medicine) Minna Merritts, MD as PCP - Cardiology (Cardiology) Lahoma Rocker, MD as Consulting Physician (Rheumatology) Minna Merritts, MD as Consulting Physician (Cardiology)    Assessment:   This is a routine wellness examination for Ruth Bishop.  Exercise Activities and Dietary recommendations     Goals Addressed   None     Fall Risk:    11/10/2022   10:59 AM 11/05/2022   11:33 AM 10/13/2022   11:45 AM 09/16/2022    1:45 PM 03/20/2022   10:01 AM  Fall Risk   Falls in the past year? 0 0 0 0 0  Number falls in past yr: 0 0 0 0 0  Injury with Fall? 0 0 0 0 0  Risk for fall due to : No Fall Risks  No Fall Risks No Fall Risks   Follow up Falls prevention discussed;Education provided;Falls evaluation completed Falls evaluation completed Falls prevention discussed;Education provided;Falls evaluation completed Education provided;Falls prevention discussed     FALL RISK PREVENTION PERTAINING TO THE HOME:  Any stairs in or around the home? No  If so, are there any without handrails? Yes   Home free of loose throw rugs in walkways, pet beds, electrical cords, etc? Yes  Adequate lighting in your home to reduce risk of falls? Yes   ASSISTIVE DEVICES UTILIZED TO PREVENT FALLS:  Life alert? No  Use of a cane, walker or w/c? No  Grab bars in the bathroom? Yes  Shower chair or bench in shower? Yes  Elevated toilet seat or a handicapped toilet? No   DME ORDERS:  DME order needed?  No   TIMED UP AND GO:  Was the test performed? No  telephone visit Length of time to ambulate 10 feet: 0 sec.   GAIT:  Education: Fall risk prevention has been discussed.  Intervention(s) required? No   DME/home health order needed?  No    Depression Screen    11/10/2022   10:59 AM 11/05/2022   11:34 AM 10/13/2022   11:45 AM 09/16/2022    1:45 PM  PHQ 2/9 Scores  PHQ -  2 Score 0 0 0 0  PHQ- 9 Score 0  0 0     Cognitive Function        11/10/2022   11:00 AM  08/17/2019   10:46 AM 08/05/2018    1:00 PM  6CIT Screen  What Year? 0 points 0 points 0 points  What month? 0 points 0 points 0 points  What time? 0 points 0 points 0 points  Count back from 20 0 points 0 points 0 points  Months in reverse 0 points 0 points 0 points  Repeat phrase 0 points 0 points 4 points  Total Score 0 points 0 points 4 points    Immunization History  Administered Date(s) Administered   Fluad Quad(high Dose 65+) 08/17/2019, 08/20/2020   Influenza, High Dose Seasonal PF 08/04/2017, 08/05/2018   Influenza, Seasonal, Injecte, Preservative Fre 10/10/2014   Influenza-Unspecified 10/23/2015, 10/06/2016, 08/21/2022   PFIZER(Purple Top)SARS-COV-2 Vaccination 01/29/2020, 02/19/2020, 09/07/2020, 08/26/2021, 09/07/2022   Pneumococcal Conjugate-13 10/10/2014   Pneumococcal Polysaccharide-23 05/24/2015, 10/23/2015   Respiratory Syncytial Virus Vaccine,Recomb Aduvanted(Arexvy) 08/21/2022   Tdap 05/24/2015   Zoster Recombinat (Shingrix) 08/04/2019, 10/07/2019    Qualifies for Shingles Vaccine? Yes  Zostavax completed 10/07/2019.   Tdap: Although this vaccine is not a covered service during a Wellness Exam, does the patient still wish to receive this vaccine today?   Not due until 05/23/2025 .   Flu Vaccine: Due for Flu vaccine. Does the patient want to receive this vaccine today?   Completed 08/21/2022 .   Pneumococcal Vaccine: Due for Pneumococcal vaccine. Does the patient want to receive this vaccine today?   Completed 10/23/2015 .   Covid-19 Vaccine: Up to date   Screening Tests Health Maintenance  Topic Date Due   Medicare Annual Wellness (AWV)  10/14/2022   COVID-19 Vaccine (6 - 2023-24 season) 11/26/2022 (Originally 11/02/2022)   MAMMOGRAM  10/10/2023   DTaP/Tdap/Td (2 - Td or Tdap) 05/23/2025   COLONOSCOPY (Pts 45-47yr Insurance coverage will need to be confirmed)  09/30/2025   Pneumonia Vaccine 74 Years old  Completed   INFLUENZA VACCINE  Completed   DEXA  SCAN  Completed   Hepatitis C Screening  Completed   Zoster Vaccines- Shingrix  Completed   HPV VACCINES  Aged Out    Cancer Screenings:  Colorectal Screening: Completed 10/01/2015. Repeat every 10 years;  Mammogram: Completed 10/09/2022. Repeat every year Bone Density: Completed 10/01/2020. Results reflect NORMAL.   Lung Cancer Screening: (Low Dose CT Chest recommended if Age 699-80years, 30 pack-year currently smoking OR have quit w/in 15years.) does not qualify.   Lung Cancer Screening Referral: An Epic message has been sent to SBurgess Estelle RN (Oncology Nurse Navigator) regarding the possible need for this exam. SRaquel Sarnawill review the patient's chart to determine if the patient truly qualifies for the exam. If the patient qualifies, SRaquel Sarnawill order the Low Dose CT of the chest to facilitate the scheduling of this exam.  Additional Screening:  Hepatitis C Screening: Completed 06/13/2015  Vision Screening: Recommended annual ophthalmology exams for early detection of glaucoma and other disorders of the eye. Is the patient up to date with their annual eye exam?  Yes  Who is the provider or what is the name of the office in which the pt attends annual eye exams? [place], [place] If pt is not established with a provider, would they like to be referred to a provider to establish care?  Already established .  Dental Screening: Recommended annual dental exams for proper oral hygiene  Community Resource Referral:  CRR required this visit?  No       Plan:  I have personally reviewed and addressed the Medicare Annual Wellness questionnaire and have noted the following in the patient's chart:  A. Medical and social history B. Use of alcohol, tobacco or illicit drugs  C. Current medications and supplements D. Functional ability and status E.  Nutritional status F.  Physical activity G. Advance directives H. List of other physicians I.   Hospitalizations, surgeries, and ER visits in previous 12 months J.  [place] such as hearing and vision if needed, cognitive and depression L. Referrals and appointments   In addition, I have reviewed and discussed with patient certain preventive protocols, quality metrics, and best practice recommendations. A written personalized care plan for preventive services as well as general preventive health recommendations were provided to patient.  Signed,    Talitha Givens, MHS, PA-C [facility]

## 2022-11-17 ENCOUNTER — Ambulatory Visit: Payer: Medicare PPO | Admitting: Family Medicine

## 2022-11-17 ENCOUNTER — Ambulatory Visit: Payer: Medicare PPO | Admitting: Internal Medicine

## 2022-12-14 ENCOUNTER — Telehealth: Payer: Self-pay | Admitting: Family Medicine

## 2022-12-14 ENCOUNTER — Ambulatory Visit (INDEPENDENT_AMBULATORY_CARE_PROVIDER_SITE_OTHER): Payer: Medicare Other | Admitting: Family Medicine

## 2022-12-14 ENCOUNTER — Encounter: Payer: Self-pay | Admitting: Family Medicine

## 2022-12-14 VITALS — BP 118/74 | HR 73 | Temp 98.0°F | Resp 18 | Ht 69.5 in | Wt 208.9 lb

## 2022-12-14 DIAGNOSIS — H9201 Otalgia, right ear: Secondary | ICD-10-CM | POA: Diagnosis not present

## 2022-12-14 DIAGNOSIS — H6121 Impacted cerumen, right ear: Secondary | ICD-10-CM | POA: Diagnosis not present

## 2022-12-14 DIAGNOSIS — L309 Dermatitis, unspecified: Secondary | ICD-10-CM

## 2022-12-14 DIAGNOSIS — J329 Chronic sinusitis, unspecified: Secondary | ICD-10-CM

## 2022-12-14 DIAGNOSIS — B372 Candidiasis of skin and nail: Secondary | ICD-10-CM | POA: Diagnosis not present

## 2022-12-14 MED ORDER — FLUTICASONE PROPIONATE 50 MCG/ACT NA SUSP: 2.0000 | Freq: Every day | NASAL | 6 refills | Status: DC

## 2022-12-14 MED ORDER — LEVOCETIRIZINE DIHYDROCHLORIDE 5 MG PO TABS: 5.0000 mg | ORAL_TABLET | Freq: Every evening | ORAL | 1 refills | Status: DC

## 2022-12-14 MED ORDER — TRIAMCINOLONE ACETONIDE 0.5 % EX OINT: 1.0000 | TOPICAL_OINTMENT | Freq: Two times a day (BID) | CUTANEOUS | 0 refills | Status: DC | PRN

## 2022-12-14 MED ORDER — NYSTATIN 100000 UNIT/GM EX POWD: 1.0000 | Freq: Three times a day (TID) | CUTANEOUS | 1 refills | Status: DC | PRN

## 2022-12-14 NOTE — Telephone Encounter (Unsigned)
Copied from [place] [id number]. Topic: General - Other >> Dec 14, 2022 10:06 AM Chapman Fitch wrote: Reason for CRM: Pt would like to schedule Bone density test/  stated she is due for one /please advise

## 2022-12-14 NOTE — Progress Notes (Signed)
Patient ID: Ruth Bishop, female    DOB: June 20, 1946, 77 y.o.   MRN: [id number]  PCP: Steele Sizer, MD  Chief Complaint  Patient presents with   Ear Drainage    Feels as if fluid coming out, only happens in winter and at night.  Has seen specialist before    Subjective:   Ruth Bishop is a 77 y.o. female, presents to clinic with CC of the following:  HPI  Right ear pain, recurrent over years, usually occurs during winter months and at night She has seen ENT in the past - reports they didn't find or do anything for it She described it as pain around her ear and a feeling of "whooshing" or fluid running out of her ear She has not had actual drainage She wants ears checked today She is not currently taking her steroid nasal spray or allergy medication  Patient Active Problem List   Diagnosis Date Noted   Hypertriglyceridemia 03/20/2022   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   Obesity (BMI 30-39.9) 03/20/2022   Urinary frequency 10/21/2021   Lung nodules 03/26/2020   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   Elevated C-reactive protein (CRP) 02/14/2018   Elevated antinuclear antibody (ANA) level 02/14/2018   First degree AV block 09/17/2017   Left atrial enlargement 09/17/2017   Aortic regurgitation 06/17/2016   Mitral regurgitation 63/87/5643   Diastolic dysfunction 32/95/1884   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   Gastroesophageal reflux disease 05/24/2015   Hypertension goal BP (blood pressure) < 150/90 05/24/2015   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      Current Outpatient Medications:    albuterol (VENTOLIN HFA) 108 (90 Base) MCG/ACT inhaler, Inhale 2 puffs into the lungs every 4 (four) hours as needed for wheezing or shortness of breath., Disp: 18 g, Rfl: 1   aspirin EC 81 MG tablet, Take 81 mg by mouth daily., Disp: , Rfl:    Blood Pressure Monitoring (ADULT BLOOD PRESSURE CUFF LG) KIT, 1 each by Does not apply  route daily., Disp: 1 kit, Rfl: 0   Calcium Carbonate-Vitamin D 600-400 MG-UNIT tablet, Take 1 tablet by mouth daily. , Disp: , Rfl:    docusate sodium (COLACE) 250 MG capsule, Take 250 mg by mouth daily., Disp: , Rfl:    famotidine (PEPCID) 20 MG tablet, Take 1 tablet (20 mg total) by mouth 2 (two) times daily as needed for heartburn or indigestion., Disp: 60 tablet, Rfl: 2   levocetirizine (XYZAL) 5 MG tablet, Take 1 tablet (5 mg total) by mouth every evening., Disp: 90 tablet, Rfl: 1   losartan (COZAAR) 100 MG tablet, Take 1 tablet (100 mg total) by mouth daily., Disp: 90 tablet, Rfl: 0   MULTIPLE VITAMIN PO, Take 1 tablet by mouth daily. , Disp: , Rfl:    pantoprazole (PROTONIX) 40 MG tablet, Take 1 tablet (40 mg total) by mouth daily., Disp: 30 tablet, Rfl: 1   psyllium (METAMUCIL) 0.52 g capsule, Take 0.52 g by mouth daily., Disp: , Rfl:    tiZANidine (ZANAFLEX) 2 MG tablet, Take 1 tablet (2 mg total) by mouth every 8 (eight) hours as needed for muscle spasms., Disp: 30 tablet, Rfl: 0   TRI-LUMA 0.01-4-0.05 % CREA, SMARTSIG:1 Sparingly Topical Every Night, Disp: , Rfl:    TURMERIC PO, Take 1 tablet by mouth daily. , Disp: , Rfl:    fluticasone (FLONASE) 50 MCG/ACT nasal spray, Place 2 sprays into both nostrils daily., Disp: 16 g, Rfl: 6  nystatin (MYCOSTATIN/NYSTOP) powder, Apply 1 Application topically 3 (three) times daily between meals as needed (to affected skin fold/rash as needed)., Disp: 60 g, Rfl: 1   triamcinolone ointment (KENALOG) 0.5 %, Apply 1 Application topically 2 (two) times daily as needed (to affected areas of skin eczema/rashs). Do not apply to face, Disp: 30 g, Rfl: 0   Allergies  Allergen Reactions   Nitrofurantoin Monohyd Macro      Social History   Tobacco Use   Smoking status: Never   Smokeless tobacco: Never   Tobacco comments:    smoking cessation materials not required  Vaping Use   Vaping Use: Never used  Substance Use Topics   Alcohol use: Not  Currently    Alcohol/week: 0.0 standard drinks of alcohol   Drug use: No      Chart Review Today: I personally reviewed active problem list, medication list, allergies, family history, social history, health maintenance, notes from last encounter, lab results, imaging with the patient/caregiver today.   Review of Systems  Constitutional: Negative.   HENT: Negative.    Eyes: Negative.   Respiratory: Negative.    Cardiovascular: Negative.   Gastrointestinal: Negative.   Endocrine: Negative.   Genitourinary: Negative.   Musculoskeletal: Negative.   Skin: Negative.   Allergic/Immunologic: Negative.   Neurological: Negative.   Hematological: Negative.   Psychiatric/Behavioral: Negative.    All other systems reviewed and are negative.      Objective:   Vitals:   12/14/22 1302  BP: 118/74  Pulse: 73  Resp: 18  Temp: 98 F (36.7 C)  SpO2: 96%  Weight: 208 lb 14.4 oz (94.8 kg)  Height: 5' 9.5" (1.765 m)    Body mass index is 30.41 kg/m.  Physical Exam Vitals and nursing note reviewed.  Constitutional:      Appearance: She is well-developed.  HENT:     Head: Normocephalic and atraumatic.     Right Ear: Hearing, ear canal and external ear normal. Tenderness present. No drainage. There is impacted cerumen. No mastoid tenderness.     Left Ear: Hearing, tympanic membrane, ear canal and external ear normal. No decreased hearing noted. No tenderness.  No middle ear effusion. There is no impacted cerumen. No mastoid tenderness. No PE tube.     Ears:     Comments: Right ear with 85-90% wax obstructing full view of TM, visible portions are normal in appearance She notes tenderness with manipulation of pinna or tragus Ttp anteriorly and posteriorly to ear, no preauricular or postauricular lymphadenopathy palpated, no right mastoid tenderness to palpation    Nose: Mucosal edema, congestion and rhinorrhea present. Rhinorrhea is clear.     Right Turbinates: Enlarged, swollen and  pale.     Left Turbinates: Enlarged, swollen and pale.     Right Sinus: No maxillary sinus tenderness or frontal sinus tenderness.     Left Sinus: No maxillary sinus tenderness or frontal sinus tenderness.     Comments: Very swollen nasal mucosa and inferior turbinates bilaterally - nearly completely occluded nares bilaterally     Mouth/Throat:     Mouth: Mucous membranes are moist.     Pharynx: Oropharynx is clear.  Eyes:     General:        Right eye: No discharge.        Left eye: No discharge.     Conjunctiva/sclera: Conjunctivae normal.  Neck:     Trachea: No tracheal deviation.  Cardiovascular:     Rate and Rhythm: Normal rate  and regular rhythm.  Pulmonary:     Effort: Pulmonary effort is normal. No respiratory distress.     Breath sounds: No stridor.  Musculoskeletal:        General: Normal range of motion.  Skin:    General: Skin is warm and dry.     Findings: No rash.  Neurological:     Mental Status: She is alert.     Motor: No abnormal muscle tone.     Coordination: Coordination normal.  Psychiatric:        Behavior: Behavior normal.      Indication: Cerumen impaction of the ear(s)  Medical necessity statement: On physical examination, cerumen impairs clinically significant portions of the external auditory canal, and tympanic membrane. Noted obstructive, copious cerumen that cannot be removed without magnification and instrumentations requiring MD/APP skills/procedure  Consent: Discussed benefits and risks of procedure and verbal consent obtained  Procedure:  Cerumen Disimpaction  Patient was prepped for the procedure.  Utilized an otoscope to assess and take note of the ear canal, the tympanic membrane, and the presence, amount, and placement of the cerumen.  Gentle ear lavage with warm water and hydrogen peroxide performed on the right ear.    Post procedure examination: shows cerumen was completely removed. Patient did not tolerate procedure well, was  stopped early due to dizziness There were no complications and following the disimpaction the tympanic membrane was not visible on the right. Vertigo did not persist when I reexamined pt, she reported popping in her ear.  The patient is made aware that they may experience temporary vertigo, temporary hearing loss, and temporary discomfort.  If these symptom last for more than 24 hours to follow up in clinic.  Encouraged to use debrox at home and irrigate - come back to office for TM recheck      Assessment & Plan:     ICD-10-CM   1. Acute otalgia, right  H92.01 fluticasone (FLONASE) 50 MCG/ACT nasal spray   suspect eustachian tube dysfunction, visible portion of TM was normal, but full TM not visualized, similar sx previously to left ear, reviewed ETD and tx w pt    2. Impacted cerumen of right ear  H61.21 EAR CERUMEN REMOVAL   blocking complete visualization of TM, irrigated here today and reexamined - pt did not tolerate irrigation and wax was not removed - debrox at home f/up    3. Chronic rhinosinusitis  J32.9 fluticasone (FLONASE) 50 MCG/ACT nasal spray    levocetirizine (XYZAL) 5 MG tablet   encouraged more frequency and consistent use of intranasal steroid and allergy meds which will help with nasal, airway and ear sx, can see ENT if needed    4. Intertriginous candidiasis  B37.2 nystatin (MYCOSTATIN/NYSTOP) powder   pt req refill on meds with new insurance, reminded her to work on other tx measures - avoiding friction, keeping skin dry, f/up if not better    5. Chronic dermatitis of hands  L30.9 triamcinolone ointment (KENALOG) 0.5 %   refill on meds      See plan above with procedure. F/up in office if repeated irrigation is needed Either way I would recommend provider visualizing the TM - I suspect ETD - but could not see whole TM today    Delsa Grana, PA-C 12/14/22 2:22 PM

## 2022-12-14 NOTE — Telephone Encounter (Signed)
Called pt and informed her she had a bone density ordered 11/10/22. Gave her the number to call and schedule, pt gave verbal understanding.

## 2022-12-14 NOTE — Patient Instructions (Signed)
Eustachian Tube Dysfunction  Eustachian tube dysfunction refers to a condition in which a blockage develops in the narrow passage that connects the middle ear to the back of the nose (eustachian tube). The eustachian tube regulates air pressure in the middle ear by letting air move between the ear and nose. It also helps to drain fluid from the middle ear space. Eustachian tube dysfunction can affect one or both ears. When the eustachian tube does not function properly, air pressure, fluid, or both can build up in the middle ear. What are the causes? This condition occurs when the eustachian tube becomes blocked or cannot open normally. Common causes of this condition include: Ear infections. Colds and other infections that affect the nose, mouth, and throat (upper respiratory tract). Allergies. Irritation from cigarette smoke. Irritation from stomach acid coming up into the esophagus (gastroesophageal reflux). The esophagus is the part of the body that moves food from the mouth to the stomach. Sudden changes in air pressure, such as from descending in an airplane or scuba diving. Abnormal growths in the nose or throat, such as: Growths that line the nose (nasal polyps). Abnormal growth of cells (tumors). Enlarged tissue at the back of the throat (adenoids). What increases the risk? You are more likely to develop this condition if: You smoke. You are overweight. You are a child who has: Certain birth defects of the mouth, such as cleft palate. Large tonsils or adenoids. What are the signs or symptoms? Common symptoms of this condition include: A feeling of fullness in the ear. Ear pain. Clicking or popping noises in the ear. Ringing in the ear (tinnitus). Hearing loss. Loss of balance. Dizziness. Symptoms may get worse when the air pressure around you changes, such as when you travel to an area of high elevation, fly on an airplane, or go scuba diving. How is this diagnosed? This  condition may be diagnosed based on: Your symptoms. A physical exam of your ears, nose, and throat. Tests, such as those that measure: The movement of your eardrum. Your hearing (audiometry). How is this treated? Treatment depends on the cause and severity of your condition. In mild cases, you may relieve your symptoms by moving air into your ears. This is called "popping the ears." In more severe cases, or if you have symptoms of fluid in your ears, treatment may include: Medicines to relieve congestion (decongestants). Medicines that treat allergies (antihistamines). Nasal sprays or ear drops that contain medicines that reduce swelling (steroids). A procedure to drain the fluid in your eardrum. In this procedure, a small tube may be placed in the eardrum to: Drain the fluid. Restore the air in the middle ear space. A procedure to insert a balloon device through the nose to inflate the opening of the eustachian tube (balloon dilation). Follow these instructions at home: Lifestyle Do not do any of the following until your health care provider approves: Travel to high altitudes. Fly in airplanes. Work in a pressurized cabin or room. Scuba dive. Do not use any products that contain nicotine or tobacco. These products include cigarettes, chewing tobacco, and vaping devices, such as e-cigarettes. If you need help quitting, ask your health care provider. Keep your ears dry. Wear fitted earplugs during showering and bathing. Dry your ears completely after. General instructions Take over-the-counter and prescription medicines only as told by your health care provider. Use techniques to help pop your ears as recommended by your health care provider. These may include: Chewing gum. Yawning. Frequent, forceful swallowing.   Closing your mouth, holding your nose closed, and gently blowing as if you are trying to blow air out of your nose. Keep all follow-up visits. This is important. Contact a  health care provider if: Your symptoms do not go away after treatment. Your symptoms come back after treatment. You are unable to pop your ears. You have: A fever. Pain in your ear. Pain in your head or neck. Fluid draining from your ear. Your hearing suddenly changes. You become very dizzy. You lose your balance. Get help right away if: You have a sudden, severe increase in any of your symptoms. Summary Eustachian tube dysfunction refers to a condition in which a blockage develops in the eustachian tube. It can be caused by ear infections, allergies, inhaled irritants, or abnormal growths in the nose or throat. Symptoms may include ear pain or fullness, hearing loss, or ringing in the ears. Mild cases are treated with techniques to unblock the ears, such as yawning or chewing gum. More severe cases are treated with medicines or procedures. This information is not intended to replace advice given to you by your health care provider. Make sure you discuss any questions you have with your health care provider. Document Revised: 02/03/2021 Document Reviewed: 02/03/2021 Elsevier Patient Education  2023 [employer]  

## 2022-12-21 NOTE — Progress Notes (Unsigned)
Name: Ruth Bishop   MRN: [id number]    DOB: 03/19/1946   Date:12/22/2022       Progress Note  Subjective  Chief Complaint  Ear Problems  HPI  Ear fullness: she was seen last week for ear fullness and had ear lavage but only partially removed wax, she continues to have the muffled sensation  Eustachian tube dysfunction: feeling better since started flonase and zyrtec daily She states fullness and tenderness has improved.   HTN: bp is a little elevated today but she has not taken her medications this morning yet. 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   Elevated C-reactive protein (CRP) 02/14/2018   Elevated antinuclear antibody (ANA) level 02/14/2018   First degree AV block 09/17/2017   Left atrial enlargement 09/17/2017   Aortic regurgitation 06/17/2016   Mitral regurgitation 15/17/6160   Diastolic dysfunction 73/71/0626   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   Gastroesophageal reflux disease 05/24/2015   Hypertension goal BP (blood pressure) < 150/90 05/24/2015   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    Past Surgical History:  Procedure Laterality Date   ABDOMINAL HYSTERECTOMY     COLONOSCOPY WITH PROPOFOL N/A 10/01/2015   Procedure: COLONOSCOPY WITH PROPOFOL;  Surgeon: Lollie Sails, MD;  Location: [facility] ENDOSCOPY;  Service: Endoscopy;  Laterality: N/A;   ESOPHAGOGASTRODUODENOSCOPY (EGD) WITH PROPOFOL N/A 10/01/2015   Procedure: ESOPHAGOGASTRODUODENOSCOPY (EGD) WITH PROPOFOL;  Surgeon: Lollie Sails, MD;  Location: [facility] ENDOSCOPY;  Service: Endoscopy;  Laterality: N/A;    Family History  Problem Relation Age of Onset   Heart disease  Mother    Diabetes Mother    Aneurysm Mother    Heart disease Father    Heart attack Father    Heart disease Sister    Diabetes Sister    Pneumonia Sister    Heart disease Brother    Diabetes Brother    Diabetes Sister    Heart disease Sister    Congestive Heart Failure Sister    Breast cancer Neg Hx     Social History   Tobacco Use   Smoking status: Never   Smokeless tobacco: Never   Tobacco comments:    smoking cessation materials not required  Substance Use Topics   Alcohol use: Not Currently    Alcohol/week: 0.0 standard drinks of alcohol     Current Outpatient Medications:    albuterol (VENTOLIN HFA) 108 (90 Base) MCG/ACT inhaler, Inhale 2 puffs into the lungs every 4 (four) hours as needed for wheezing or shortness of breath., Disp: 18 g, Rfl: 1   aspirin EC 81 MG tablet, Take 81 mg by mouth daily., Disp: , Rfl:    Blood Pressure Monitoring (ADULT BLOOD PRESSURE CUFF LG) KIT, 1 each by Does not apply route daily., Disp: 1 kit, Rfl: 0   Calcium Carbonate-Vitamin D 600-400 MG-UNIT tablet, Take 1 tablet by mouth daily. , Disp: , Rfl:    docusate sodium (COLACE) 250 MG capsule, Take 250 mg by mouth daily., Disp: , Rfl:    famotidine (PEPCID) 20 MG tablet, Take 1 tablet (20 mg total) by mouth 2 (two) times daily as needed for heartburn or indigestion., Disp: 60 tablet, Rfl: 2   fluticasone (  FLONASE) 50 MCG/ACT nasal spray, Place 2 sprays into both nostrils daily., Disp: 16 g, Rfl: 6   levocetirizine (XYZAL) 5 MG tablet, Take 1 tablet (5 mg total) by mouth every evening., Disp: 90 tablet, Rfl: 1   losartan (COZAAR) 100 MG tablet, Take 1 tablet (100 mg total) by mouth daily., Disp: 90 tablet, Rfl: 0   MULTIPLE VITAMIN PO, Take 1 tablet by mouth daily. , Disp: , Rfl:    nystatin (MYCOSTATIN/NYSTOP) powder, Apply 1 Application topically 3 (three) times daily between meals as needed (to affected skin fold/rash as needed)., Disp: 60 g, Rfl: 1   pantoprazole (PROTONIX) 40 MG  tablet, Take 1 tablet (40 mg total) by mouth daily., Disp: 30 tablet, Rfl: 1   psyllium (METAMUCIL) 0.52 g capsule, Take 0.52 g by mouth daily., Disp: , Rfl:    tiZANidine (ZANAFLEX) 2 MG tablet, Take 1 tablet (2 mg total) by mouth every 8 (eight) hours as needed for muscle spasms., Disp: 30 tablet, Rfl: 0   TRI-LUMA 0.01-4-0.05 % CREA, SMARTSIG:1 Sparingly Topical Every Night, Disp: , Rfl:    triamcinolone ointment (KENALOG) 0.5 %, Apply 1 Application topically 2 (two) times daily as needed (to affected areas of skin eczema/rashs). Do not apply to face, Disp: 30 g, Rfl: 0   TURMERIC PO, Take 1 tablet by mouth daily. , Disp: , Rfl:   Allergies  Allergen Reactions   Nitrofurantoin Monohyd Macro     I personally reviewed active problem list, medication list, allergies, family history, social history, health maintenance with the patient/caregiver today.   ROS  Ten systems reviewed and is negative except as mentioned in HPI   Objective  Vitals:   12/22/22 1033 12/22/22 1044  BP: (!) 148/72 (!) 140/70  Pulse: 75   Resp: 16   SpO2: 96%   Weight: 208 lb (94.3 kg)   Height: '5\' 9"'$  (1.753 m)     Body mass index is 30.72 kg/m.  Physical Exam  Constitutional: Patient appears well-developed and well-nourished. Obese  No distress.  HEENT: head atraumatic, normocephalic, pupils equal and reactive to light, ears normal left TM, right TM not visualized even after lavage , neck supple Cardiovascular: Normal rate, regular rhythm and normal heart sounds.  No murmur heard. No BLE edema. Pulmonary/Chest: Effort normal and breath sounds normal. No respiratory distress. Abdominal: Soft.  There is no tenderness. Psychiatric: Patient has a normal mood and affect. behavior is normal. Judgment and thought content normal.   PHQ2/9:    12/22/2022   10:31 AM 12/14/2022    1:03 PM 11/10/2022   10:59 AM 11/05/2022   11:34 AM 10/13/2022   11:45 AM  Depression screen PHQ 2/9  Decreased Interest 0 0 0 0 0   Down, Depressed, Hopeless 0 0 0 0 0  PHQ - 2 Score 0 0 0 0 0  Altered sleeping 0 0 0  0  Tired, decreased energy 0 0 0  0  Change in appetite 0 0 0  0  Feeling bad or failure about yourself  0 0 0  0  Trouble concentrating 0 0 0  0  Moving slowly or fidgety/restless 0 0 0  0  Suicidal thoughts 0 0 0  0  PHQ-9 Score 0 0 0  0  Difficult doing work/chores  Not difficult at all Not difficult at all  Not difficult at all    phq 9 is negative   Fall Risk:    12/22/2022   10:31 AM 12/14/2022  1:03 PM 11/10/2022   10:59 AM 11/05/2022   11:33 AM 10/13/2022   11:45 AM  Fall Risk   Falls in the past year? 0 0 0 0 0  Number falls in past yr: 0 0 0 0 0  Injury with Fall? 0 0 0 0 0  Risk for fall due to : No Fall Risks  No Fall Risks  No Fall Risks  Follow up Falls prevention discussed  Falls prevention discussed;Education provided;Falls evaluation completed Falls evaluation completed Falls prevention discussed;Education provided;Falls evaluation completed      Functional Status Survey: Is the patient deaf or have difficulty hearing?: No Does the patient have difficulty seeing, even when wearing glasses/contacts?: No Does the patient have difficulty concentrating, remembering, or making decisions?: No Does the patient have difficulty walking or climbing stairs?: No Does the patient have difficulty dressing or bathing?: No Does the patient have difficulty doing errands alone such as visiting a doctor's office or shopping?: No    Assessment & Plan  1. Impacted cerumen of right ear  - Ear Lavage  Verbal consent given Possible side effects discussed with patient Ears were  lavaged with warm water and peroxide  Patient tolerated procedure well No complications    - Ambulatory referral to ENT  2. Benign hypertension  Reminded her to take bp daily even if fasting   3. Dysfunction of right eustachian tube  Improved with nasal steroid and zyrtec

## 2022-12-22 ENCOUNTER — Ambulatory Visit (INDEPENDENT_AMBULATORY_CARE_PROVIDER_SITE_OTHER): Payer: Medicare Other | Admitting: Family Medicine

## 2022-12-22 ENCOUNTER — Encounter: Payer: Self-pay | Admitting: Family Medicine

## 2022-12-22 VITALS — BP 140/70 | HR 75 | Resp 16 | Ht 69.0 in | Wt 208.0 lb

## 2022-12-22 DIAGNOSIS — H6991 Unspecified Eustachian tube disorder, right ear: Secondary | ICD-10-CM | POA: Diagnosis not present

## 2022-12-22 DIAGNOSIS — I10 Essential (primary) hypertension: Secondary | ICD-10-CM | POA: Diagnosis not present

## 2022-12-22 DIAGNOSIS — H6121 Impacted cerumen, right ear: Secondary | ICD-10-CM

## 2023-01-01 ENCOUNTER — Ambulatory Visit
Admission: RE | Admit: 2023-01-01 | Discharge: 2023-01-01 | Disposition: A | Discharge status: Home / Self Care | Payer: Medicare Other | Source: Ambulatory Visit | Attending: Physician Assistant | Admitting: Physician Assistant

## 2023-01-01 DIAGNOSIS — Z13820 Encounter for screening for osteoporosis: Secondary | ICD-10-CM | POA: Diagnosis not present

## 2023-01-01 DIAGNOSIS — Z0000 Encounter for general adult medical examination without abnormal findings: Secondary | ICD-10-CM | POA: Diagnosis not present

## 2023-01-01 DIAGNOSIS — Z780 Asymptomatic menopausal state: Secondary | ICD-10-CM | POA: Diagnosis not present

## 2023-01-28 ENCOUNTER — Ambulatory Visit: Payer: Self-pay | Admitting: *Deleted

## 2023-01-28 ENCOUNTER — Encounter: Payer: Self-pay | Admitting: Family Medicine

## 2023-01-28 ENCOUNTER — Ambulatory Visit (INDEPENDENT_AMBULATORY_CARE_PROVIDER_SITE_OTHER): Payer: Medicare Other | Admitting: Family Medicine

## 2023-01-28 VITALS — BP 152/72 | HR 83 | Temp 98.2°F | Resp 16 | Ht 69.0 in | Wt 213.0 lb

## 2023-01-28 DIAGNOSIS — I10 Essential (primary) hypertension: Secondary | ICD-10-CM | POA: Diagnosis not present

## 2023-01-28 DIAGNOSIS — R5383 Other fatigue: Secondary | ICD-10-CM | POA: Diagnosis not present

## 2023-01-28 DIAGNOSIS — R718 Other abnormality of red blood cells: Secondary | ICD-10-CM

## 2023-01-28 MED ORDER — LOSARTAN POTASSIUM 100 MG PO TABS: 100.0000 mg | ORAL_TABLET | Freq: Every day | ORAL | 1 refills | Status: DC

## 2023-01-28 NOTE — Telephone Encounter (Addendum)
Answer Assessment - Initial Assessment Questions 1. BLOOD PRESSURE: "What is the blood pressure?" "Did you take at least two measurements 5 minutes apart?"     182/80 P 64,  2. ONSET: "When did you take your blood pressure?"     1:13, 1:30 3. HOW: "How did you take your blood pressure?" (e.g., automatic home BP monitor, visiting nurse)     Automatic- arm 4. HISTORY: "Do you have a history of high blood pressure?"     yes 5. MEDICINES: "Are you taking any medicines for blood pressure?" "Have you missed any doses recently?"     No changes/missed 6. OTHER SYMPTOMS: "Do you have any symptoms?" (e.g., blurred vision, chest pain, difficulty breathing, headache, weakness)     Deceased energy level, sleeping too much, headache  Protocols used: Blood Pressure - High-A-AH  Call dropped as trying to get second BP reading- attempted to call patient back twice- left message to call office Patient returned call- lost service  BP 189/81- 1:45

## 2023-01-28 NOTE — Assessment & Plan Note (Signed)
Bp elevated so pt came in to be checked She previously has HTN managed with losartan 100 mg daily and diet/lifestyle efforts BP Readings from Last 5 Encounters:  12/22/22 (!) 140/70  12/14/22 118/74  11/05/22 128/72  10/13/22 (!) 148/80  09/16/22 (!) 144/70

## 2023-01-28 NOTE — Progress Notes (Signed)
Patient ID: Ruth Bishop, female    DOB: May 31, 1946, 77 y.o.   MRN: [id number]  PCP: Delsa Grana, PA-C  Chief Complaint  Patient presents with   Hypertension    Pt states BP up and down at home.     Subjective:   Ruth Bishop is a 77 y.o. female, presents to clinic with CC of the following:  HPI  Here for elevated BP Some elevated readings in clinic over the past year, some of those encounters with noted anxiety about the BP and being in clinic Her meds were increased from losartan 50 to 100 previously (oct to Nov) but she states she is taking 50 only  Home readings for the past week have been elevated - when compared to manual bp in office - machine is 20-30 points higher SBP BP Readings from Last 3 Encounters:  01/28/23 (!) 152/72  12/22/22 (!) 140/70  12/14/22 118/74  No LE edema, CP, SOB, DOE, orthopnea, palpitations, HA  Last OV with cardiology was June 2023 - seen for CP - suspected to be costochondritis, at that time BP was well controlled  Asked to do 12 month f/up visit with cardiology  She has not been exercising or playing tennis like she normally does Mood, energy, just feels generally weak and tired, not feeling like herself, no joint pain or swelling No other recent changes or concerns She doesn't feel like she is down or depressed just lack of desire to do anything cause she is weak and tired feeling for about the past 1.5 months    01/28/2023    2:55 PM 12/22/2022   10:31 AM 12/14/2022    1:03 PM  Depression screen PHQ 2/9  Decreased Interest 0 0 0  Down, Depressed, Hopeless 0 0 0  PHQ - 2 Score 0 0 0  Altered sleeping 0 0 0  Tired, decreased energy 0 0 0  Change in appetite 0 0 0  Feeling bad or failure about yourself  0 0 0  Trouble concentrating 0 0 0  Moving slowly or fidgety/restless 0 0 0  Suicidal thoughts 0 0 0  PHQ-9 Score 0 0 0  Difficult doing work/chores Not difficult at all  Not difficult at all      Patient Active Problem List    Diagnosis Date Noted   Hypertriglyceridemia 03/20/2022   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   Obesity (BMI 30-39.9) 03/20/2022   Urinary frequency 10/21/2021   Lung nodules 03/26/2020   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   Elevated C-reactive protein (CRP) 02/14/2018   Elevated antinuclear antibody (ANA) level 02/14/2018   First degree AV block 09/17/2017   Left atrial enlargement 09/17/2017   Aortic regurgitation 06/17/2016   Mitral regurgitation [id number]   Diastolic dysfunction [id number]   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   Gastroesophageal reflux disease 05/24/2015   Hypertension goal BP (blood pressure) < 150/90 05/24/2015   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      Current Outpatient Medications:    albuterol (VENTOLIN HFA) 108 (90 Base) MCG/ACT inhaler, Inhale 2 puffs into the lungs every 4 (four) hours as needed for wheezing or shortness of breath., Disp: 18 g, Rfl: 1   aspirin EC 81 MG tablet, Take 81 mg by mouth daily., Disp: , Rfl:    Blood Pressure Monitoring (ADULT BLOOD PRESSURE CUFF LG) KIT, 1 each by Does not apply route daily., Disp: 1 kit, Rfl: 0   Calcium Carbonate-Vitamin D 600-400 MG-UNIT tablet, Take  1 tablet by mouth daily. , Disp: , Rfl:    docusate sodium (COLACE) 250 MG capsule, Take 250 mg by mouth daily., Disp: , Rfl:    famotidine (PEPCID) 20 MG tablet, Take 1 tablet (20 mg total) by mouth 2 (two) times daily as needed for heartburn or indigestion., Disp: 60 tablet, Rfl: 2   fluticasone (FLONASE) 50 MCG/ACT nasal spray, Place 2 sprays into both nostrils daily., Disp: 16 g, Rfl: 6   levocetirizine (XYZAL) 5 MG tablet, Take 1 tablet (5 mg total) by mouth every evening., Disp: 90 tablet, Rfl: 1   MULTIPLE VITAMIN PO, Take 1 tablet by mouth daily. , Disp: , Rfl:    nystatin (MYCOSTATIN/NYSTOP) powder, Apply 1 Application topically 3 (three) times daily between meals as needed (to affected skin fold/rash as  needed)., Disp: 60 g, Rfl: 1   pantoprazole (PROTONIX) 40 MG tablet, Take 1 tablet (40 mg total) by mouth daily., Disp: 30 tablet, Rfl: 1   psyllium (METAMUCIL) 0.52 g capsule, Take 0.52 g by mouth daily., Disp: , Rfl:    tiZANidine (ZANAFLEX) 2 MG tablet, Take 1 tablet (2 mg total) by mouth every 8 (eight) hours as needed for muscle spasms., Disp: 30 tablet, Rfl: 0   TRI-LUMA 0.01-4-0.05 % CREA, SMARTSIG:1 Sparingly Topical Every Night, Disp: , Rfl:    triamcinolone ointment (KENALOG) 0.5 %, Apply 1 Application topically 2 (two) times daily as needed (to affected areas of skin eczema/rashs). Do not apply to face, Disp: 30 g, Rfl: 0   TURMERIC PO, Take 1 tablet by mouth daily. , Disp: , Rfl:    losartan (COZAAR) 100 MG tablet, Take 1 tablet (100 mg total) by mouth daily., Disp: 90 tablet, Rfl: 1   Allergies  Allergen Reactions   Nitrofurantoin Monohyd Macro      Social History   Tobacco Use   Smoking status: Never   Smokeless tobacco: Never   Tobacco comments:    smoking cessation materials not required  Vaping Use   Vaping Use: Never used  Substance Use Topics   Alcohol use: Not Currently    Alcohol/week: 0.0 standard drinks of alcohol   Drug use: No      Chart Review Today: I personally reviewed active problem list, medication list, allergies, family history, social history, health maintenance, notes from last encounter, lab results, imaging with the patient/caregiver today.   Review of Systems  Constitutional: Negative.   HENT: Negative.    Eyes: Negative.   Respiratory: Negative.    Cardiovascular: Negative.   Gastrointestinal: Negative.   Endocrine: Negative.   Genitourinary: Negative.   Musculoskeletal: Negative.   Skin: Negative.   Allergic/Immunologic: Negative.   Neurological: Negative.   Hematological: Negative.   Psychiatric/Behavioral: Negative.    All other systems reviewed and are negative.      Objective:   Vitals:   01/28/23 1456 01/28/23 1539   BP: (!) 158/72 (!) 152/72  Pulse: 83   Resp: 16   Temp: 98.2 F (36.8 C)   TempSrc: Oral   SpO2: 98%   Weight: 213 lb (96.6 kg)   Height: 5' 9"$  (1.753 m)     Body mass index is 31.45 kg/m.  Physical Exam Vitals and nursing note reviewed.  Constitutional:      General: She is not in acute distress.    Appearance: Normal appearance. She is well-developed. She is obese. She is not ill-appearing, toxic-appearing or diaphoretic.  HENT:     Head: Normocephalic and atraumatic.  Right Ear: External ear normal.     Left Ear: External ear normal.     Nose: Nose normal.  Eyes:     General:        Right eye: No discharge.        Left eye: No discharge.     Conjunctiva/sclera: Conjunctivae normal.  Neck:     Trachea: No tracheal deviation.  Cardiovascular:     Rate and Rhythm: Normal rate and regular rhythm.     Pulses: Normal pulses.     Heart sounds: Normal heart sounds. No murmur heard.    No friction rub. No gallop.  Pulmonary:     Effort: Pulmonary effort is normal. No respiratory distress.     Breath sounds: No stridor.  Musculoskeletal:     Right lower leg: No edema.     Left lower leg: No edema.  Skin:    General: Skin is warm and dry.     Findings: No rash.  Neurological:     Mental Status: She is alert. Mental status is at baseline.     Motor: No abnormal muscle tone.     Coordination: Coordination normal.  Psychiatric:        Attention and Perception: Attention and perception normal.        Mood and Affect: Mood is not anxious.        Speech: Speech normal.        Behavior: Behavior normal.      Results for orders placed or performed in visit on 09/16/22  CBC with Differential/Platelet  Result Value Ref Range   WBC 6.3 3.8 - 10.8 Thousand/uL   RBC 4.89 3.80 - 5.10 Million/uL   Hemoglobin 12.8 11.7 - 15.5 g/dL   HCT 39.0 35.0 - 45.0 %   MCV 79.8 (L) 80.0 - 100.0 fL   MCH 26.2 (L) 27.0 - 33.0 pg   MCHC 32.8 32.0 - 36.0 g/dL   RDW 14.1 11.0 - 15.0 %    Platelets 313 140 - 400 Thousand/uL   MPV 10.7 7.5 - 12.5 fL   Neutro Abs 3,156 1,500 - 7,800 cells/uL   Lymphs Abs 2,646 850 - 3,900 cells/uL   Absolute Monocytes 359 200 - 950 cells/uL   Eosinophils Absolute 120 15 - 500 cells/uL   Basophils Absolute 19 0 - 200 cells/uL   Neutrophils Relative % 50.1 %   Total Lymphocyte 42.0 %   Monocytes Relative 5.7 %   Eosinophils Relative 1.9 %   Basophils Relative 0.3 %  COMPLETE METABOLIC PANEL WITH GFR  Result Value Ref Range   Glucose, Bld 86 65 - 99 mg/dL   BUN 14 7 - 25 mg/dL   Creat 0.76 0.60 - 1.00 mg/dL   eGFR 82 > OR = 60 mL/min/1.89m   BUN/Creatinine Ratio SEE NOTE: 6 - 22 (calc)   Sodium 142 135 - 146 mmol/L   Potassium 4.4 3.5 - 5.3 mmol/L   Chloride 107 98 - 110 mmol/L   CO2 27 20 - 32 mmol/L   Calcium 9.5 8.6 - 10.4 mg/dL   Total Protein 7.1 6.1 - 8.1 g/dL   Albumin 4.1 3.6 - 5.1 g/dL   Globulin 3.0 1.9 - 3.7 g/dL (calc)   AG Ratio 1.4 1.0 - 2.5 (calc)   Total Bilirubin 0.5 0.2 - 1.2 mg/dL   Alkaline phosphatase (APISO) 93 37 - 153 U/L   AST 14 10 - 35 U/L   ALT 9 6 - 29 U/L  Hemoglobin  A1c  Result Value Ref Range   Hgb A1c MFr Bld 5.7 (H) <5.7 % of total Hgb   Mean Plasma Glucose 117 mg/dL   eAG (mmol/L) 6.5 mmol/L  TSH  Result Value Ref Range   TSH 1.14 0.40 - 4.50 mIU/L       Assessment & Plan:   1. Hypertension goal BP (blood pressure) < 150/90 BP elevated with home readings for the past week Her cuff/monitor she brings in is reading quite a bit higher than our manual BP BP Readings from Last 3 Encounters:  01/28/23 (!) 152/72  12/22/22 (!) 140/70  12/14/22 118/74  Mildly elevated here, but found out she is taking losartan 50 mg and not 100 mg - last refills were sent to the wrong pharmacy She will take 2-50 mg until she gets the refill I encouraged her to not use here current BP machine and come into office in 2-3 weeks to recheck here - losartan (COZAAR) 100 MG tablet; Take 1 tablet (100 mg total)  by mouth daily.  Dispense: 90 tablet; Refill: 1  2. Fatigue, unspecified type Vague generalized sx of tiredness, "feeling blah", no energy, sleepy No concerning cardiac sx She denies feeling depressed She stopped exercise and tennis cause of these sx and she has gained a little weight Will do some basic labs to r/o anemia, electrolyte abnormality, hypothyroi No hx of deficiencies She previously went to rheumatology but is not diagnosed with autoimmune dx and not on tx, she is not currently having any joint pain or swelling Further denies HA, vision changes, abd sx, numbness tingling  - CBC with Differential/Platelet - COMPLETE METABOLIC PANEL WITH GFR - TSH  Return for 2-3 week BP recheck, f/up if symptoms not improving (2-4 weeks prn).     Delsa Grana, PA-C 01/28/23 4:23 PM

## 2023-01-28 NOTE — Telephone Encounter (Signed)
Reason for Disposition  Systolic BP  >= 99991111 OR Diastolic >= A999333  Protocols used: Blood Pressure - High-A-AH  Chief Complaint: elevated BP Symptoms: fatigue, no energy Frequency: several weeks Pertinent Negatives: Patient denies blurred vision, chest pain, difficulty breathing Disposition: []$ ED /[]$ Urgent Care (no appt availability in office) / [x]$ Appointment(In office/virtual)/ []$  [facility]/ []$ Home Care/ []$ Refused Recommended Disposition /[]$ [facility]/ []$  Follow-up with PCP

## 2023-01-29 LAB — COMPLETE METABOLIC PANEL WITH GFR
AG Ratio: 1.3 (calc) (ref 1.0–2.5)
ALT: 12 U/L (ref 6–29)
AST: 16 U/L (ref 10–35)
Albumin: 3.9 g/dL (ref 3.6–5.1)
Alkaline phosphatase (APISO): 95 U/L (ref 37–153)
BUN: 18 mg/dL (ref 7–25)
CO2: 28 mmol/L (ref 20–32)
Calcium: 9.5 mg/dL (ref 8.6–10.4)
Chloride: 106 mmol/L (ref 98–110)
Creat: 0.84 mg/dL (ref 0.60–1.00)
Globulin: 3.1 g/dL (calc) (ref 1.9–3.7)
Glucose, Bld: 84 mg/dL (ref 65–99)
Potassium: 4.4 mmol/L (ref 3.5–5.3)
Sodium: 143 mmol/L (ref 135–146)
Total Bilirubin: 0.3 mg/dL (ref 0.2–1.2)
Total Protein: 7 g/dL (ref 6.1–8.1)
eGFR: 73 mL/min/{1.73_m2} (ref >=60)

## 2023-01-29 LAB — CBC WITH DIFFERENTIAL/PLATELET
Absolute Monocytes: 450 cells/uL (ref 200–950)
Basophils Absolute: 30 cells/uL (ref 0–200)
Basophils Relative: 0.5 %
Eosinophils Absolute: 168 cells/uL (ref 15–500)
Eosinophils Relative: 2.8 %
HCT: 40.3 % (ref 35.0–45.0)
Hemoglobin: 13.1 g/dL (ref 11.7–15.5)
Lymphs Abs: 2742 cells/uL (ref 850–3900)
MCH: 25.5 pg — ABNORMAL LOW (ref 27.0–33.0)
MCHC: 32.5 g/dL (ref 32.0–36.0)
MCV: 78.6 fL — ABNORMAL LOW (ref 80.0–100.0)
MPV: 10.6 fL (ref 7.5–12.5)
Monocytes Relative: 7.5 %
Neutro Abs: 2610 cells/uL (ref 1500–7800)
Neutrophils Relative %: 43.5 %
Platelets: 274 10*3/uL (ref 140–400)
RBC: 5.13 10*6/uL — ABNORMAL HIGH (ref 3.80–5.10)
RDW: 15.5 % — ABNORMAL HIGH (ref 11.0–15.0)
Total Lymphocyte: 45.7 %
WBC: 6 10*3/uL (ref 3.8–10.8)

## 2023-01-29 LAB — TSH: TSH: 1.44 mIU/L (ref 0.40–4.50)

## 2023-02-01 NOTE — Addendum Note (Signed)
Addended by: Delsa Grana on: 02/01/2023 04:35 PM   Modules accepted: Orders

## 2023-02-03 DIAGNOSIS — R718 Other abnormality of red blood cells: Secondary | ICD-10-CM | POA: Diagnosis not present

## 2023-02-04 LAB — IRON,TIBC AND FERRITIN PANEL
%SAT: 24 % (calc) (ref 16–45)
Ferritin: 54 ng/mL (ref 16–288)
Iron: 75 ug/dL (ref 45–160)
TIBC: 315 mcg/dL (calc) (ref 250–450)

## 2023-02-04 LAB — VITAMIN B12: Vitamin B-12: 1497 pg/mL — ABNORMAL HIGH (ref 200–1100)

## 2023-02-04 LAB — PATHOLOGIST SMEAR REVIEW

## 2023-02-05 DIAGNOSIS — H6121 Impacted cerumen, right ear: Secondary | ICD-10-CM | POA: Diagnosis not present

## 2023-02-08 ENCOUNTER — Ambulatory Visit (INDEPENDENT_AMBULATORY_CARE_PROVIDER_SITE_OTHER): Payer: Medicare Other | Admitting: Family Medicine

## 2023-02-08 ENCOUNTER — Encounter: Payer: Self-pay | Admitting: Family Medicine

## 2023-02-08 VITALS — BP 148/74 | HR 78 | Temp 98.1°F | Resp 16 | Ht 69.0 in | Wt 212.5 lb

## 2023-02-08 DIAGNOSIS — R5383 Other fatigue: Secondary | ICD-10-CM | POA: Diagnosis not present

## 2023-02-08 DIAGNOSIS — I10 Essential (primary) hypertension: Secondary | ICD-10-CM

## 2023-02-08 DIAGNOSIS — R7303 Prediabetes: Secondary | ICD-10-CM | POA: Diagnosis not present

## 2023-02-08 DIAGNOSIS — H6991 Unspecified Eustachian tube disorder, right ear: Secondary | ICD-10-CM | POA: Diagnosis not present

## 2023-02-08 DIAGNOSIS — R718 Other abnormality of red blood cells: Secondary | ICD-10-CM | POA: Diagnosis not present

## 2023-02-08 DIAGNOSIS — R630 Anorexia: Secondary | ICD-10-CM

## 2023-02-08 DIAGNOSIS — K219 Gastro-esophageal reflux disease without esophagitis: Secondary | ICD-10-CM

## 2023-02-08 NOTE — Progress Notes (Signed)
Name: Arvada Schwertner   MRN: [id number]    DOB: 1946-08-12   Date:02/08/2023       Progress Note  Chief Complaint  Patient presents with   Follow-up   Hypertension   Results    Discuss labs     Subjective:   Ruth Bishop is a 77 y.o. female, presents to clinic for f/up on labs, HTN and fatigue  Reviewed labs today with patient Results for orders placed or performed in visit on 01/28/23  CBC with Differential/Platelet  Result Value Ref Range   WBC 6.0 3.8 - 10.8 Thousand/uL   RBC 5.13 (H) 3.80 - 5.10 Million/uL   Hemoglobin 13.1 11.7 - 15.5 g/dL   HCT 40.3 35.0 - 45.0 %   MCV 78.6 (L) 80.0 - 100.0 fL   MCH 25.5 (L) 27.0 - 33.0 pg   MCHC 32.5 32.0 - 36.0 g/dL   RDW 15.5 (H) 11.0 - 15.0 %   Platelets 274 140 - 400 Thousand/uL   MPV 10.6 7.5 - 12.5 fL   Neutro Abs 2,610 1,500 - 7,800 cells/uL   Lymphs Abs 2,742 850 - 3,900 cells/uL   Absolute Monocytes 450 200 - 950 cells/uL   Eosinophils Absolute 168 15 - 500 cells/uL   Basophils Absolute 30 0 - 200 cells/uL   Neutrophils Relative % 43.5 %   Total Lymphocyte 45.7 %   Monocytes Relative 7.5 %   Eosinophils Relative 2.8 %   Basophils Relative 0.5 %  COMPLETE METABOLIC PANEL WITH GFR  Result Value Ref Range   Glucose, Bld 84 65 - 99 mg/dL   BUN 18 7 - 25 mg/dL   Creat 0.84 0.60 - 1.00 mg/dL   eGFR 73 > OR = 60 mL/min/1.33m   BUN/Creatinine Ratio SEE NOTE: 6 - 22 (calc)   Sodium 143 135 - 146 mmol/L   Potassium 4.4 3.5 - 5.3 mmol/L   Chloride 106 98 - 110 mmol/L   CO2 28 20 - 32 mmol/L   Calcium 9.5 8.6 - 10.4 mg/dL   Total Protein 7.0 6.1 - 8.1 g/dL   Albumin 3.9 3.6 - 5.1 g/dL   Globulin 3.1 1.9 - 3.7 g/dL (calc)   AG Ratio 1.3 1.0 - 2.5 (calc)   Total Bilirubin 0.3 0.2 - 1.2 mg/dL   Alkaline phosphatase (APISO) 95 37 - 153 U/L   AST 16 10 - 35 U/L   ALT 12 6 - 29 U/L  TSH  Result Value Ref Range   TSH 1.44 0.40 - 4.50 mIU/L  Vitamin B12  Result Value Ref Range   Vitamin B-12 1,497 (H) 200 - 1,100  pg/mL  Iron, TIBC and Ferritin Panel  Result Value Ref Range   Iron 75 45 - 160 mcg/dL   TIBC 315 250 - 450 mcg/dL (calc)   %SAT 24 16 - 45 % (calc)   Ferritin 54 16 - 288 ng/mL  Pathologist smear review  Result Value Ref Range   Path Review     Mild microcytosis w/o anemia, no iron deficiency, stool FIT is pending B12 high - pt states she is not on a b12 supplement  She states she still has decreased appetite but weight has been stable Her mood and energy are a little better, but she still isn't totally herself  After saying "its become a chore to eat" she again asks about diet pills/medications Wt Readings from Last 5 Encounters:  02/08/23 212 lb 8 oz (96.4 kg)  01/28/23 213 lb (96.6 kg)  12/22/22 208 lb (94.3 kg)  12/14/22 208 lb 14.4 oz (94.8 kg)  11/05/22 207 lb 12.8 oz (94.3 kg)   BMI Readings from Last 5 Encounters:  02/08/23 31.38 kg/m  01/28/23 31.45 kg/m  12/22/22 30.72 kg/m  12/14/22 30.41 kg/m  11/05/22 30.25 kg/m              Current Outpatient Medications:    albuterol (VENTOLIN HFA) 108 (90 Base) MCG/ACT inhaler, Inhale 2 puffs into the lungs every 4 (four) hours as needed for wheezing or shortness of breath., Disp: 18 g, Rfl: 1   aspirin EC 81 MG tablet, Take 81 mg by mouth daily., Disp: , Rfl:    Blood Pressure Monitoring (ADULT BLOOD PRESSURE CUFF LG) KIT, 1 each by Does not apply route daily., Disp: 1 kit, Rfl: 0   Calcium Carbonate-Vitamin D 600-400 MG-UNIT tablet, Take 1 tablet by mouth daily. , Disp: , Rfl:    docusate sodium (COLACE) 250 MG capsule, Take 250 mg by mouth daily., Disp: , Rfl:    famotidine (PEPCID) 20 MG tablet, Take 1 tablet (20 mg total) by mouth 2 (two) times daily as needed for heartburn or indigestion., Disp: 60 tablet, Rfl: 2   fluticasone (FLONASE) 50 MCG/ACT nasal spray, Place 2 sprays into both nostrils daily., Disp: 16 g, Rfl: 6   levocetirizine (XYZAL) 5 MG tablet, Take 1 tablet (5 mg total) by mouth every  evening., Disp: 90 tablet, Rfl: 1   losartan (COZAAR) 100 MG tablet, Take 1 tablet (100 mg total) by mouth daily., Disp: 90 tablet, Rfl: 1   MULTIPLE VITAMIN PO, Take 1 tablet by mouth daily. , Disp: , Rfl:    nystatin (MYCOSTATIN/NYSTOP) powder, Apply 1 Application topically 3 (three) times daily between meals as needed (to affected skin fold/rash as needed)., Disp: 60 g, Rfl: 1   pantoprazole (PROTONIX) 40 MG tablet, Take 1 tablet (40 mg total) by mouth daily., Disp: 30 tablet, Rfl: 1   psyllium (METAMUCIL) 0.52 g capsule, Take 0.52 g by mouth daily., Disp: , Rfl:    tiZANidine (ZANAFLEX) 2 MG tablet, Take 1 tablet (2 mg total) by mouth every 8 (eight) hours as needed for muscle spasms., Disp: 30 tablet, Rfl: 0   TRI-LUMA 0.01-4-0.05 % CREA, SMARTSIG:1 Sparingly Topical Every Night, Disp: , Rfl:    triamcinolone ointment (KENALOG) 0.5 %, Apply 1 Application topically 2 (two) times daily as needed (to affected areas of skin eczema/rashs). Do not apply to face, Disp: 30 g, Rfl: 0   TURMERIC PO, Take 1 tablet by mouth daily. 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   Elevated C-reactive protein (CRP) 02/14/2018   Elevated antinuclear antibody (ANA) level 02/14/2018   First degree AV block 09/17/2017   Left atrial enlargement 09/17/2017   Aortic regurgitation 06/17/2016   Mitral regurgitation [id number]   Diastolic dysfunction [id number]   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   Gastroesophageal reflux disease 05/24/2015   Hypertension goal BP (blood pressure) < 150/90 05/24/2015   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    Past Surgical History:  Procedure Laterality Date   ABDOMINAL HYSTERECTOMY     COLONOSCOPY WITH  PROPOFOL N/A 10/01/2015   Procedure: COLONOSCOPY WITH PROPOFOL;  Surgeon: Lollie Sails, MD;  Location: [facility] ENDOSCOPY;  Service: Endoscopy;  Laterality: N/A;   ESOPHAGOGASTRODUODENOSCOPY (EGD) WITH PROPOFOL N/A 10/01/2015   Procedure: ESOPHAGOGASTRODUODENOSCOPY (EGD) WITH PROPOFOL;  Surgeon: Lollie Sails, MD;  Location[facility] ENDOSCOPY;  Service: Endoscopy;  Laterality: N/A;    Family History  Problem Relation Age of Onset   Heart disease Mother    Diabetes Mother    Aneurysm Mother    Heart disease Father    Heart attack Father    Heart disease Sister    Diabetes Sister    Pneumonia Sister    Heart disease Brother    Diabetes Brother    Diabetes Sister    Heart disease Sister    Congestive Heart Failure Sister    Breast cancer Neg Hx     Social History   Tobacco Use   Smoking status: Never   Smokeless tobacco: Never   Tobacco comments:    smoking cessation materials not required  Vaping Use   Vaping Use: Never used  Substance Use Topics   Alcohol use: Not Currently    Alcohol/week: 0.0 standard drinks of alcohol   Drug use: No     Allergies  Allergen Reactions   Nitrofurantoin Monohyd Macro     Health Maintenance  Topic Date Due   COVID-19 Vaccine (6 - 2023-24 season) 02/24/2023 (Originally 11/02/2022)   MAMMOGRAM  10/10/2023   Medicare Annual Wellness (AWV)  01/02/2024   DTaP/Tdap/Td (2 - Td or Tdap) 05/23/2025   COLONOSCOPY (Pts 45-51yr Insurance coverage will need to be confirmed)  09/30/2025   Pneumonia Vaccine 75 Years old  Completed   INFLUENZA VACCINE  Completed   DEXA SCAN  Completed   Hepatitis C Screening  Completed   Zoster Vaccines- Shingrix  Completed   HPV VACCINES  Aged Out    Chart Review Today: I personally reviewed active problem list, medication list, allergies, family history, social history, health maintenance, notes from last encounter, lab results, imaging with the patient/caregiver today.   Review of Systems   Constitutional: Negative.   HENT: Negative.    Eyes: Negative.   Respiratory: Negative.    Cardiovascular: Negative.   Gastrointestinal: Negative.   Endocrine: Negative.   Genitourinary: Negative.   Musculoskeletal: Negative.   Skin: Negative.   Allergic/Immunologic: Negative.   Neurological: Negative.   Hematological: Negative.   Psychiatric/Behavioral: Negative.    All other systems reviewed and are negative.    Objective:   Vitals:   02/08/23 1133 02/08/23 1223  BP: (!) 150/72 (!) 148/74  Pulse: 78   Resp: 16   Temp: 98.1 F (36.7 C)   TempSrc: Oral   SpO2: 98%   Weight: 212 lb 8 oz (96.4 kg)   Height: '5\' 9"'$  (1.753 m)     Body mass index is 31.38 kg/m.  Physical Exam Vitals and nursing note reviewed.  Constitutional:      General: She is not in acute distress.    Appearance: Normal appearance. She is well-developed, well-groomed and overweight. She is not ill-appearing, toxic-appearing or diaphoretic.  HENT:     Head: Normocephalic and atraumatic.     Nose: Nose normal.  Eyes:     General:        Right eye: No discharge.        Left eye: No discharge.     Conjunctiva/sclera: Conjunctivae normal.  Neck:     Trachea: No tracheal deviation.  Cardiovascular:     Rate and Rhythm: Normal rate and regular rhythm.  Pulmonary:  Effort: Pulmonary effort is normal. No respiratory distress.     Breath sounds: No stridor.  Musculoskeletal:        General: Normal range of motion.  Skin:    General: Skin is warm and dry.     Findings: No rash.  Neurological:     Mental Status: She is alert.     Motor: No abnormal muscle tone.     Coordination: Coordination normal.  Psychiatric:        Attention and Perception: Attention normal.        Mood and Affect: Mood and affect normal.        Speech: Speech normal.        Behavior: Behavior normal. Behavior is cooperative.         Assessment & Plan:   1. Hypertension  Meds were increased to losartan 100 mg  daily BP Readings from Last 3 Encounters:  02/08/23 (!) 148/74  01/28/23 (!) 152/72  12/22/22 (!) 140/70  BP slightly improved Continue losartan 100 mg daily and encouraged her to increase her activity as weather permits  Recheck in 3 months   2. Fatigue, unspecified type Slightly better - mood is better today too Ddx - related to autoimmune dx? Vs MDD/SAD? TSH normal, no anemia, B12 was high  3. Microcytosis Labs r/o low iron FIT test pending   4. Dysfunction of right eustachian tube She went to ENT - she will continue same plan - flonase and xyzal   5. Gastroesophageal reflux disease, unspecified whether esophagitis present Given pepcid last OV for some decreased appetite, early fullness and unintentional weight loss Weight stable - appetite unchanged, no Abd pain  6. Decreased appetite See #5  7. Prediabetes Lab Results  Component Value Date   HGBA1C 5.7 (H) 09/16/2022  She does not want to check labs again yet - will check in 3 months with A1c and CMP   Encouraged pt to come into office sooner if energy/weight/mood not improving or any other concerns  Otherwise 3 month routine f/up      Return for 3 month f/up HTN, prediabetes (come sooner if energy/mood not improving).   Delsa Grana, PA-C 02/08/23 11:56 AM

## 2023-02-09 LAB — FECAL GLOBIN BY IMMUNOCHEMISTRY
FECAL GLOBIN RESULT:: NOT DETECTED
MICRO NUMBER:: 14644683
SPECIMEN QUALITY:: ADEQUATE

## 2023-02-11 ENCOUNTER — Ambulatory Visit: Payer: Medicare Other | Admitting: Family Medicine

## 2023-02-24 ENCOUNTER — Telehealth: Payer: Self-pay | Admitting: Family Medicine

## 2023-02-24 NOTE — Telephone Encounter (Signed)
Called pt and she states she wants to speak to PCP. I advised patient PCP is not in office and she will not be back till next week. She verbalized understanding, I advised if she wanted to tell her directly she can send a mychart message and it will be forward to provider and she verbalized understanding. Pt denied in going into details of what she wanted to tell PCP.

## 2023-02-24 NOTE — Telephone Encounter (Signed)
Copied from [place] [id number]. Topic: General - Other >> Feb 24, 2023  9:11 AM [place] B wrote: Reason for CRM: Patient called in wants to speak with Dr Lenoard Aden nurse. She wouldn't go into details

## 2023-02-25 ENCOUNTER — Encounter: Payer: Self-pay | Admitting: Family Medicine

## 2023-03-01 ENCOUNTER — Ambulatory Visit (INDEPENDENT_AMBULATORY_CARE_PROVIDER_SITE_OTHER): Payer: Medicare Other | Admitting: Family Medicine

## 2023-03-01 ENCOUNTER — Encounter: Payer: Self-pay | Admitting: Family Medicine

## 2023-03-01 VITALS — BP 142/72 | HR 76 | Temp 97.7°F | Resp 16 | Ht 69.0 in | Wt 213.8 lb

## 2023-03-01 DIAGNOSIS — R197 Diarrhea, unspecified: Secondary | ICD-10-CM | POA: Diagnosis not present

## 2023-03-01 DIAGNOSIS — I10 Essential (primary) hypertension: Secondary | ICD-10-CM | POA: Diagnosis not present

## 2023-03-01 DIAGNOSIS — R1906 Epigastric swelling, mass or lump: Secondary | ICD-10-CM

## 2023-03-01 DIAGNOSIS — R630 Anorexia: Secondary | ICD-10-CM

## 2023-03-01 DIAGNOSIS — E669 Obesity, unspecified: Secondary | ICD-10-CM | POA: Diagnosis not present

## 2023-03-01 DIAGNOSIS — R140 Abdominal distension (gaseous): Secondary | ICD-10-CM | POA: Diagnosis not present

## 2023-03-01 DIAGNOSIS — R5383 Other fatigue: Secondary | ICD-10-CM

## 2023-03-01 DIAGNOSIS — K580 Irritable bowel syndrome with diarrhea: Secondary | ICD-10-CM

## 2023-03-01 DIAGNOSIS — Z6831 Body mass index (BMI) 31.0-31.9, adult: Secondary | ICD-10-CM

## 2023-03-01 MED ORDER — PANTOPRAZOLE SODIUM 40 MG PO TBEC: 40.0000 mg | DELAYED_RELEASE_TABLET | Freq: Two times a day (BID) | ORAL | 0 refills | Status: DC

## 2023-03-01 NOTE — Progress Notes (Signed)
Patient ID: Ruth Bishop, female    DOB: 01-30-1946, 77 y.o.   MRN: [id number]  PCP: Delsa Grana, PA-C  Chief Complaint  Patient presents with   Weight Loss    Pt interested in weight loss meds    Subjective:   Ruth Bishop is a 77 y.o. female, presents to clinic with CC of the following:  HPI  Here for BP recheck, GI f/up and she is concerned about weight changes  She reports she is eating because she "has to" not because she wants to Eating healthy including Fish, baked chicken, vegetables.  She has tried to cut out breads She reports "regular bowel movements daily"- she takes metamucil, phillips probiotic and stool softener - though she doesn't know what kind/brand She feels like something is wrong with her stomach, feels bloated, sore,gassy, appetite changes for 6+ months Diarrhea sometimes all the sudden when food disagrees with her - for example this past weekend she drank some beer and ended up in the bathroom with diarrhea x 3  Prior hemoccult neg She denies vomiting She had a little appetite and stomach upset improvement after trying protonix x2 month   She also again complains of decreased appetite and concerns about her weight but wants weight loss meds - when asked why she wants weight loss meds she says because her abdomen has gotten so large and bloated.  Overall she is similar weight to her baseline over the apst couple years.  She is active tennis most of the year, doing some sit ups and push ups, eating healthy Wt Readings from Last 15 Encounters:  03/01/23 213 lb 12.8 oz (97 kg)  02/08/23 212 lb 8 oz (96.4 kg)  01/28/23 213 lb (96.6 kg)  12/22/22 208 lb (94.3 kg)  12/14/22 208 lb 14.4 oz (94.8 kg)  11/05/22 207 lb 12.8 oz (94.3 kg)  10/13/22 208 lb 1.6 oz (94.4 kg)  09/16/22 206 lb 8 oz (93.7 kg)  05/22/22 215 lb (97.5 kg)  03/20/22 215 lb 1.6 oz (97.6 kg)  02/13/22 215 lb 14.4 oz (97.9 kg)  12/17/21 214 lb 11.2 oz (97.4 kg)  10/21/21 213 lb 11.2  oz (96.9 kg)  04/03/21 217 lb 1.6 oz (98.5 kg)  10/16/20 211 lb (95.7 kg)   BMI Readings from Last 5 Encounters:  03/01/23 31.57 kg/m  02/08/23 31.38 kg/m  01/28/23 31.45 kg/m  12/22/22 30.72 kg/m  12/14/22 30.41 kg/m       Patient Active Problem List   Diagnosis Date Noted   Hypertriglyceridemia 03/20/2022   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   Obesity (BMI 30-39.9) 03/20/2022   Urinary frequency 10/21/2021   Lung nodules 03/26/2020   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   Elevated C-reactive protein (CRP) 02/14/2018   Elevated antinuclear antibody (ANA) level 02/14/2018   First degree AV block 09/17/2017   Left atrial enlargement 09/17/2017   Aortic regurgitation 06/17/2016   Mitral regurgitation [id number]   Diastolic dysfunction [id number]   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   Gastroesophageal reflux disease 05/24/2015   Hypertension goal BP (blood pressure) < 150/90 05/24/2015   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      Current Outpatient Medications:    albuterol (VENTOLIN HFA) 108 (90 Base) MCG/ACT inhaler, Inhale 2 puffs into the lungs every 4 (four) hours as needed for wheezing or shortness of breath., Disp: 18 g, Rfl: 1   aspirin EC 81 MG tablet, Take 81 mg by mouth daily., Disp: , Rfl:  Blood Pressure Monitoring (ADULT BLOOD PRESSURE CUFF LG) KIT, 1 each by Does not apply route daily., Disp: 1 kit, Rfl: 0   Calcium Carbonate-Vitamin D 600-400 MG-UNIT tablet, Take 1 tablet by mouth daily. , Disp: , Rfl:    docusate sodium (COLACE) 250 MG capsule, Take 250 mg by mouth daily., Disp: , Rfl:    famotidine (PEPCID) 20 MG tablet, Take 1 tablet (20 mg total) by mouth 2 (two) times daily as needed for heartburn or indigestion., Disp: 60 tablet, Rfl: 2   fluticasone (FLONASE) 50 MCG/ACT nasal spray, Place 2 sprays into both nostrils daily., Disp: 16 g, Rfl: 6   levocetirizine (XYZAL) 5 MG tablet, Take 1 tablet (5 mg  total) by mouth every evening., Disp: 90 tablet, Rfl: 1   losartan (COZAAR) 100 MG tablet, Take 1 tablet (100 mg total) by mouth daily., Disp: 90 tablet, Rfl: 1   MULTIPLE VITAMIN PO, Take 1 tablet by mouth daily. , Disp: , Rfl:    nystatin (MYCOSTATIN/NYSTOP) powder, Apply 1 Application topically 3 (three) times daily between meals as needed (to affected skin fold/rash as needed)., Disp: 60 g, Rfl: 1   pantoprazole (PROTONIX) 40 MG tablet, Take 1 tablet (40 mg total) by mouth daily., Disp: 30 tablet, Rfl: 1   psyllium (METAMUCIL) 0.52 g capsule, Take 0.52 g by mouth daily., Disp: , Rfl:    tiZANidine (ZANAFLEX) 2 MG tablet, Take 1 tablet (2 mg total) by mouth every 8 (eight) hours as needed for muscle spasms., Disp: 30 tablet, Rfl: 0   TRI-LUMA 0.01-4-0.05 % CREA, SMARTSIG:1 Sparingly Topical Every Night, Disp: , Rfl:    triamcinolone ointment (KENALOG) 0.5 %, Apply 1 Application topically 2 (two) times daily as needed (to affected areas of skin eczema/rashs). Do not apply to face, Disp: 30 g, Rfl: 0   TURMERIC PO, Take 1 tablet by mouth daily. , Disp: , Rfl:    Allergies  Allergen Reactions   Nitrofurantoin Monohyd Macro      Social History   Tobacco Use   Smoking status: Never   Smokeless tobacco: Never   Tobacco comments:    smoking cessation materials not required  Vaping Use   Vaping Use: Never used  Substance Use Topics   Alcohol use: Not Currently    Alcohol/week: 0.0 standard drinks of alcohol   Drug use: No      Chart Review Today: I personally reviewed active problem list, medication list, allergies, family history, social history, health maintenance, notes from last encounter, lab results, imaging with the patient/caregiver today.    Review of Systems  Constitutional: Negative.   HENT: Negative.    Eyes: Negative.   Respiratory: Negative.    Cardiovascular: Negative.   Gastrointestinal: Negative.   Endocrine: Negative.   Genitourinary: Negative.    Musculoskeletal: Negative.   Skin: Negative.   Allergic/Immunologic: Negative.   Neurological: Negative.   Hematological: Negative.   Psychiatric/Behavioral: Negative.    All other systems reviewed and are negative.      Objective:   Vitals:   03/01/23 1102 03/01/23 1126  BP: (!) 168/70 (!) 142/72  Pulse: 76   Resp: 16   Temp: 97.7 F (36.5 C)   TempSrc: Oral   SpO2: 98%   Weight: 213 lb 12.8 oz (97 kg)   Height: 5\' 9"  (1.753 m)     Body mass index is 31.57 kg/m.  Physical Exam Vitals and nursing note reviewed.  Constitutional:      General: She is not  in acute distress.    Appearance: Normal appearance. She is well-developed and overweight. She is not ill-appearing, toxic-appearing or diaphoretic.  HENT:     Head: Normocephalic and atraumatic.     Nose: Nose normal.  Eyes:     General:        Right eye: No discharge.        Left eye: No discharge.     Conjunctiva/sclera: Conjunctivae normal.  Neck:     Trachea: No tracheal deviation.  Cardiovascular:     Rate and Rhythm: Normal rate and regular rhythm.  Pulmonary:     Effort: Pulmonary effort is normal. No respiratory distress.     Breath sounds: No stridor.  Abdominal:     General: Abdomen is protuberant. Bowel sounds are normal. There is no distension.     Palpations: Abdomen is soft.     Tenderness: There is no abdominal tenderness.  Musculoskeletal:        General: Normal range of motion.  Skin:    General: Skin is warm and dry.     Findings: No rash.  Neurological:     Mental Status: She is alert.     Motor: No abnormal muscle tone.     Coordination: Coordination normal.  Psychiatric:        Attention and Perception: Attention normal.        Mood and Affect: Mood is depressed.        Behavior: Behavior normal. Behavior is cooperative.      Results for orders placed or performed in visit on 01/28/23  CBC with Differential/Platelet  Result Value Ref Range   WBC 6.0 3.8 - 10.8 Thousand/uL    RBC 5.13 (H) 3.80 - 5.10 Million/uL   Hemoglobin 13.1 11.7 - 15.5 g/dL   HCT 40.3 35.0 - 45.0 %   MCV 78.6 (L) 80.0 - 100.0 fL   MCH 25.5 (L) 27.0 - 33.0 pg   MCHC 32.5 32.0 - 36.0 g/dL   RDW 15.5 (H) 11.0 - 15.0 %   Platelets 274 140 - 400 Thousand/uL   MPV 10.6 7.5 - 12.5 fL   Neutro Abs 2,610 1,500 - 7,800 cells/uL   Lymphs Abs 2,742 850 - 3,900 cells/uL   Absolute Monocytes 450 200 - 950 cells/uL   Eosinophils Absolute 168 15 - 500 cells/uL   Basophils Absolute 30 0 - 200 cells/uL   Neutrophils Relative % 43.5 %   Total Lymphocyte 45.7 %   Monocytes Relative 7.5 %   Eosinophils Relative 2.8 %   Basophils Relative 0.5 %  COMPLETE METABOLIC PANEL WITH GFR  Result Value Ref Range   Glucose, Bld 84 65 - 99 mg/dL   BUN 18 7 - 25 mg/dL   Creat 0.84 0.60 - 1.00 mg/dL   eGFR 73 > OR = 60 mL/min/1.99m2   BUN/Creatinine Ratio SEE NOTE: 6 - 22 (calc)   Sodium 143 135 - 146 mmol/L   Potassium 4.4 3.5 - 5.3 mmol/L   Chloride 106 98 - 110 mmol/L   CO2 28 20 - 32 mmol/L   Calcium 9.5 8.6 - 10.4 mg/dL   Total Protein 7.0 6.1 - 8.1 g/dL   Albumin 3.9 3.6 - 5.1 g/dL   Globulin 3.1 1.9 - 3.7 g/dL (calc)   AG Ratio 1.3 1.0 - 2.5 (calc)   Total Bilirubin 0.3 0.2 - 1.2 mg/dL   Alkaline phosphatase (APISO) 95 37 - 153 U/L   AST 16 10 - 35 U/L   ALT  12 6 - 29 U/L  TSH  Result Value Ref Range   TSH 1.44 0.40 - 4.50 mIU/L  Fecal Globin By Immunochemistry  Result Value Ref Range   MICRO NUMBER: [id number]    SPECIMEN QUALITY: Adequate    Source: INSURE (TM) FOBT TEST CARD    STATUS: FINAL    FECAL GLOBIN RESULT: Not Detected   Vitamin B12  Result Value Ref Range   Vitamin B-12 1,497 (H) 200 - 1,100 pg/mL  Iron, TIBC and Ferritin Panel  Result Value Ref Range   Iron 75 45 - 160 mcg/dL   TIBC 315 250 - 450 mcg/dL (calc)   %SAT 24 16 - 45 % (calc)   Ferritin 54 16 - 288 ng/mL  Pathologist smear review  Result Value Ref Range   Path Review         Assessment & Plan:    1.  Abdominal bloating Pt continues to complain about sx - for 2-4 weeks trial of BID PPI and f/up with GI - pantoprazole (PROTONIX) 40 MG tablet; Take 1 tablet (40 mg total) by mouth 2 (two) times daily.  Dispense: 60 tablet; Refill: 0 - Ambulatory referral to Gastroenterology  2. Epigastric fullness Full and bloated feeling after only a few bites, no N/V, small improvement with protonix daily - but sx continue, labs were normal, weight stable - PPI BID and f/up GI - pantoprazole (PROTONIX) 40 MG tablet; Take 1 tablet (40 mg total) by mouth 2 (two) times daily.  Dispense: 60 tablet; Refill: 0 - Ambulatory referral to Gastroenterology  3. Diarrhea, unspecified type Intermittent - with food triggers that upset her stomach - this is consistent with past dx of IBS Gave pt low FODMAP handout to look over and see if she can adjust anything in diet and see improved/decreased sx - Ambulatory referral to Gastroenterology  4. Class 1 obesity with serious comorbidity and body mass index (BMI) of 31.0 to 31.9 in adult, unspecified obesity type Pt over the past 6 months have been worried about weight - worried when she lost a little weight, now worried that she gained that weight back - though its only <5 lbs She asks about weight loss medications but I do not think those are appropriate or necessary at this time - and I have explained that she already has decreased appetite and upset stomach- I would not given her a medication that further decreases appetite or could give GI SE I reviewed with her weight/BMI over the past couple years - seems like her biggest concern is bloated abdomen Overall weight about the same or slightly less than a few years ago She is eating healthy and physically active She may benefit from RD consult Wt Readings from Last 10 Encounters:  03/01/23 213 lb 12.8 oz (97 kg)  02/08/23 212 lb 8 oz (96.4 kg)  01/28/23 213 lb (96.6 kg)  12/22/22 208 lb (94.3 kg)  12/14/22 208 lb 14.4 oz  (94.8 kg)  11/05/22 207 lb 12.8 oz (94.3 kg)  10/13/22 208 lb 1.6 oz (94.4 kg)  09/16/22 206 lb 8 oz (93.7 kg)  05/22/22 215 lb (97.5 kg)  03/20/22 215 lb 1.6 oz (97.6 kg)   BMI Readings from Last 5 Encounters:  03/01/23 31.57 kg/m  02/08/23 31.38 kg/m  01/28/23 31.45 kg/m  12/22/22 30.72 kg/m  12/14/22 30.41 kg/m     5. Fatigue, unspecified type She continues to have decreased energy and mood - all labs previously done unremarkable for anemia,  hypothyroid Prior episodes in the past of similar fatigue, she was referred to rheumatology but at the time she was not having any joint pain or other sx warranting tx and she states rheumatology did nothing for her - episodes were very similar to current and recent sx -    6. Decreased appetite  Pt reports she still isn't hungry but she's making herself eat - pantoprazole (PROTONIX) 40 MG tablet; Take 1 tablet (40 mg total) by mouth 2 (two) times daily.  Dispense: 60 tablet; Refill: 0 - Ambulatory referral to Gastroenterology  7. Irritable bowel syndrome with diarrhea Hx of - f/up with GI  8. Hypertension goal BP (blood pressure) < 150/90 BP mildly elevated with initial VS but improved when rechecked, still a little above goal, but acceptable On losartan 100 mg daily, doing DASH and exercising Consider adding norvasc low dose BP Readings from Last 3 Encounters:  03/01/23 (!) 142/72  02/08/23 (!) 148/74  01/28/23 (!) 152/72   She has f/up appt in June 2024   Delsa Grana, PA-C 03/01/23 11:28 AM

## 2023-03-01 NOTE — Patient Instructions (Signed)
Low-FODMAP Eating Plan  FODMAP stands for fermentable oligosaccharides, disaccharides, monosaccharides, and polyols. These are sugars that are hard for some people to digest. A low-FODMAP eating plan may help some people who have irritable bowel syndrome (IBS) and certain other bowel (intestinal) diseases to manage their symptoms. This meal plan can be complicated to follow. Work with a diet and nutrition specialist (dietitian) to make a low-FODMAP eating plan that is right for you. A dietitian can help make sure that you get enough nutrition from this diet. What are tips for following this plan? Reading food labels Check labels for hidden FODMAPs such as: High-fructose syrup. Honey. Agave. Natural fruit flavors. Onion or garlic powder. Choose low-FODMAP foods that contain 3-4 grams of fiber per serving. Check food labels for serving sizes. Eat only one serving at a time to make sure FODMAP levels stay low. Shopping Shop with a list of foods that are recommended on this diet and make a meal plan. Meal planning Follow a low-FODMAP eating plan for up to 6 weeks, or as told by your health care provider or dietitian. To follow the eating plan: Eliminate high-FODMAP foods from your diet completely. Choose only low-FODMAP foods to eat. You will do this for 2-6 weeks. Gradually reintroduce high-FODMAP foods into your diet one at a time. Most people should wait a few days before introducing the next new high-FODMAP food into their meal plan. Your dietitian can recommend how quickly you may reintroduce foods. Keep a daily record of what and how much you eat and drink. Make note of any symptoms that you have after eating. Review your daily record with a dietitian regularly to identify which foods you can eat and which foods you should avoid. General tips Drink enough fluid each day to keep your urine pale yellow. Avoid processed foods. These often have added sugar and may be high in FODMAPs. Avoid  most dairy products, whole grains, and sweeteners. Work with a dietitian to make sure you get enough fiber in your diet. Avoid high FODMAP foods at meals to manage symptoms. Recommended foods Fruits Bananas, oranges, tangerines, lemons, limes, blueberries, raspberries, strawberries, grapes, cantaloupe, honeydew melon, kiwi, papaya, passion fruit, and pineapple. Limited amounts of dried cranberries, banana chips, and shredded coconut. Vegetables Eggplant, zucchini, cucumber, peppers, green beans, bean sprouts, lettuce, arugula, kale, Swiss chard, spinach, collard greens, bok choy, summer squash, potato, and tomato. Limited amounts of corn, carrot, and sweet potato. Green parts of scallions. Grains Gluten-free grains, such as rice, oats, buckwheat, quinoa, corn, polenta, and millet. Gluten-free pasta, bread, or cereal. Rice noodles. Corn tortillas. Meats and other proteins Unseasoned beef, pork, poultry, or fish. Eggs. Bacon. Tofu (firm) and tempeh. Limited amounts of nuts and seeds, such as almonds, walnuts, brazil nuts, pecans, peanuts, nut butters, pumpkin seeds, chia seeds, and sunflower seeds. Dairy Lactose-free milk, yogurt, and kefir. Lactose-free cottage cheese and ice cream. Non-dairy milks, such as almond, coconut, hemp, and rice milk. Non-dairy yogurt. Limited amounts of goat cheese, brie, mozzarella, parmesan, swiss, and other hard cheeses. Fats and oils Butter-free spreads. Vegetable oils, such as olive, canola, and sunflower oil. Seasoning and other foods Artificial sweeteners with names that do not end in "ol," such as aspartame, saccharine, and stevia. Maple syrup, white table sugar, raw sugar, brown sugar, and molasses. Mayonnaise, soy sauce, and tamari. Fresh basil, coriander, parsley, rosemary, and thyme. Beverages Water and mineral water. Sugar-sweetened soft drinks. Small amounts of orange juice or cranberry juice. Black and green tea. Most dry wines.   Coffee. The items listed  above may not be a complete list of foods and beverages you can eat. Contact a dietitian for more information. Foods to avoid Fruits Fresh, dried, and juiced forms of apple, pear, watermelon, peach, plum, cherries, apricots, blackberries, boysenberries, figs, nectarines, and mango. Avocado. Vegetables Chicory root, artichoke, asparagus, cabbage, snow peas, Brussels sprouts, broccoli, sugar snap peas, mushrooms, celery, and cauliflower. Onions, garlic, leeks, and the white part of scallions. Grains Wheat, including kamut, durum, and semolina. Barley and bulgur. Couscous. Wheat-based cereals. Wheat noodles, bread, crackers, and pastries. Meats and other proteins Fried or fatty meat. Sausage. Cashews and pistachios. Soybeans, baked beans, black beans, chickpeas, kidney beans, fava beans, navy beans, lentils, black-eyed peas, and split peas. Dairy Milk, yogurt, ice cream, and soft cheese. Cream and sour cream. Milk-based sauces. Custard. Buttermilk. Soy milk. Seasoning and other foods Any sugar-free gum or candy. Foods that contain artificial sweeteners such as sorbitol, mannitol, isomalt, or xylitol. Foods that contain honey, high-fructose corn syrup, or agave. Bouillon, vegetable stock, beef stock, and chicken stock. Garlic and onion powder. Condiments made with onion, such as hummus, chutney, pickles, relish, salad dressing, and salsa. Tomato paste. Beverages Chicory-based drinks. Coffee substitutes. Chamomile tea. Fennel tea. Sweet or fortified wines such as port or sherry. Diet soft drinks made with isomalt, mannitol, maltitol, sorbitol, or xylitol. Apple, pear, and mango juice. Juices with high-fructose corn syrup. The items listed above may not be a complete list of foods and beverages you should avoid. Contact a dietitian for more information. Summary FODMAP stands for fermentable oligosaccharides, disaccharides, monosaccharides, and polyols. These are sugars that are hard for some people to  digest. A low-FODMAP eating plan is a short-term diet that helps to ease symptoms of certain bowel diseases. The eating plan usually lasts up to 6 weeks. After that, high-FODMAP foods are reintroduced gradually and one at a time. This can help you find out which foods may be causing symptoms. A low-FODMAP eating plan can be complicated. It is best to work with a dietitian who has experience with this type of plan. This information is not intended to replace advice given to you by your health care provider. Make sure you discuss any questions you have with your health care provider. Document Revised: 04/11/2020 Document Reviewed: 04/11/2020 Elsevier Patient Education  2023 [employer]  

## 2023-03-02 DIAGNOSIS — B351 Tinea unguium: Secondary | ICD-10-CM | POA: Diagnosis not present

## 2023-03-02 DIAGNOSIS — M79674 Pain in right toe(s): Secondary | ICD-10-CM | POA: Diagnosis not present

## 2023-03-02 DIAGNOSIS — M2042 Other hammer toe(s) (acquired), left foot: Secondary | ICD-10-CM | POA: Diagnosis not present

## 2023-03-02 DIAGNOSIS — L84 Corns and callosities: Secondary | ICD-10-CM | POA: Diagnosis not present

## 2023-03-02 DIAGNOSIS — M79675 Pain in left toe(s): Secondary | ICD-10-CM | POA: Diagnosis not present

## 2023-03-02 DIAGNOSIS — M2041 Other hammer toe(s) (acquired), right foot: Secondary | ICD-10-CM | POA: Diagnosis not present

## 2023-03-04 ENCOUNTER — Ambulatory Visit: Payer: Medicare Other | Admitting: Family Medicine

## 2023-03-08 ENCOUNTER — Telehealth: Payer: Self-pay | Admitting: Family Medicine

## 2023-03-08 DIAGNOSIS — R630 Anorexia: Secondary | ICD-10-CM

## 2023-03-08 DIAGNOSIS — R197 Diarrhea, unspecified: Secondary | ICD-10-CM

## 2023-03-08 DIAGNOSIS — R140 Abdominal distension (gaseous): Secondary | ICD-10-CM

## 2023-03-08 DIAGNOSIS — R1906 Epigastric swelling, mass or lump: Secondary | ICD-10-CM

## 2023-03-08 NOTE — Telephone Encounter (Signed)
Per patient request

## 2023-03-08 NOTE — Telephone Encounter (Signed)
Copied from [place] [id number]. Topic: Referral - Status >> Mar 08, 2023 11:03 AM Cyndi Bender wrote: Reason for CRM: Pt requests that her records be sent to Dr. Wilford Corner a GI doctor in [place] ph# [phone]. Pt stated she was told the fax# should be in the system

## 2023-03-08 NOTE — Telephone Encounter (Signed)
New referral for GI

## 2023-03-09 ENCOUNTER — Telehealth: Payer: Self-pay

## 2023-03-09 NOTE — Telephone Encounter (Signed)
I called pt to ask if they wanted the OV note related to referral or her medical records. Pt stats just OV note, I advised pt if they happen to send back they need all her medical records, she will need to come in person to sign the medical release form to be sent over to [place] office. Pt verbalized understanding. I will fax today her OV note related to her referral today.

## 2023-03-09 NOTE — Telephone Encounter (Signed)
Called Ruth Bishop to ask what type of medical records they needed, she verbalized back Dr.Schooler needs all her GI records before determining whether he can see her or not.  I called pt to let her know that [facility] office needs her GI medical records from [facility] to determine if Dr.Schooler could see her. She verbalized understanding     Copied from [place] [id number]. Topic: Referral - Status >> Mar 09, 2023  9:55 AM Cyndi Bender wrote: Reason for CRM: Ruth Bishop with [facility] stated they received referral request but the pt has an appt with [facility] and if she is seeking a sooner appt they do not have any appts before June. Ruth Bishop also stated they would need all GI records so they can be reviewed before Dr. Michail Sermon can approve transfer of care. Cb# [phone]

## 2023-03-09 NOTE — Telephone Encounter (Signed)
Patient called in states that Dr Janan Ridge Dr needs medical records from Dr Lucio Edward also, not just the GI Dr.

## 2023-03-10 DIAGNOSIS — M2042 Other hammer toe(s) (acquired), left foot: Secondary | ICD-10-CM | POA: Diagnosis not present

## 2023-03-10 DIAGNOSIS — M205X2 Other deformities of toe(s) (acquired), left foot: Secondary | ICD-10-CM | POA: Diagnosis not present

## 2023-03-10 DIAGNOSIS — M25572 Pain in left ankle and joints of left foot: Secondary | ICD-10-CM | POA: Diagnosis not present

## 2023-03-19 DIAGNOSIS — L0889 Other specified local infections of the skin and subcutaneous tissue: Secondary | ICD-10-CM | POA: Diagnosis not present

## 2023-03-19 DIAGNOSIS — M86172 Other acute osteomyelitis, left ankle and foot: Secondary | ICD-10-CM | POA: Diagnosis not present

## 2023-03-26 ENCOUNTER — Other Ambulatory Visit: Payer: Self-pay | Admitting: Family Medicine

## 2023-03-26 DIAGNOSIS — R1906 Epigastric swelling, mass or lump: Secondary | ICD-10-CM

## 2023-03-26 DIAGNOSIS — R630 Anorexia: Secondary | ICD-10-CM

## 2023-03-26 DIAGNOSIS — R140 Abdominal distension (gaseous): Secondary | ICD-10-CM

## 2023-04-07 ENCOUNTER — Other Ambulatory Visit: Payer: Self-pay | Admitting: Family Medicine

## 2023-04-07 DIAGNOSIS — I10 Essential (primary) hypertension: Secondary | ICD-10-CM

## 2023-04-22 ENCOUNTER — Other Ambulatory Visit: Payer: Self-pay | Admitting: Family Medicine

## 2023-04-22 DIAGNOSIS — L309 Dermatitis, unspecified: Secondary | ICD-10-CM

## 2023-04-26 ENCOUNTER — Other Ambulatory Visit: Payer: Self-pay | Admitting: Family Medicine

## 2023-04-26 DIAGNOSIS — R1906 Epigastric swelling, mass or lump: Secondary | ICD-10-CM

## 2023-04-26 DIAGNOSIS — R630 Anorexia: Secondary | ICD-10-CM

## 2023-04-26 DIAGNOSIS — R140 Abdominal distension (gaseous): Secondary | ICD-10-CM

## 2023-05-11 ENCOUNTER — Ambulatory Visit: Payer: Medicare Other | Admitting: Family Medicine

## 2023-05-19 ENCOUNTER — Encounter: Payer: Self-pay | Admitting: Gastroenterology

## 2023-05-19 ENCOUNTER — Ambulatory Visit: Payer: Medicare Other | Admitting: Gastroenterology

## 2023-05-19 VITALS — BP 132/78 | HR 72 | Temp 98.4°F | Ht 69.0 in | Wt 216.5 lb

## 2023-05-19 DIAGNOSIS — R140 Abdominal distension (gaseous): Secondary | ICD-10-CM | POA: Diagnosis not present

## 2023-05-19 NOTE — Progress Notes (Signed)
Arlyss Repress, MD [address]  [address]  [place], [place] [address]  Main: [phone]  Fax: [phone]    Gastroenterology Consultation  Referring Provider:     Danelle Berry, PA-C Primary Care Physician:  Danelle Berry, PA-C Primary Gastroenterologist:  Dr. Arlyss Repress Reason for Consultation: Abdominal bloating        HPI:   Ruth Bishop is a 77 y.o. female referred by Danelle Berry, PA-C  for consultation & management of abdominal bloating.  Patient reports several years history of abdominal bloating.  She reports she always feels bloated, although she does not eat 3 meals a day.  She does report intermittent constipation, takes stool softeners as needed.  She does eat skinny polyps.  She denies any loss of appetite or weight loss.  She denies any abdominal pain.  She reports that for last several months, she lost interest in going to church or staying active.  She used to play tennis which she is no longer doing it.  She is concerned if she is depressed.  She was evaluated by gastroenterologist in the past, Dr. Marva Panda.  Underwent upper endoscopy and colonoscopy in 09/2015 for similar symptoms which revealed sigmoid colon diverticulosis and subcentimeter polyps which were unremarkable.  She also underwent random colon biopsies which were negative.  Upper endoscopy revealed multiple gastric polyps, gastritis, LA grade A erosive esophagitis and flattened mucosa in the duodenum.  CT scan in 2016 revealed no intra-abdominal pathology  NSAIDs: None  Antiplts/Anticoagulants/Anti thrombotics: None  GI Procedures:  Upper endoscopy and colonoscopy 10/01/2015 A. DUODENUM; COLD BIOPSY: - SMALL BOWEL MUCOSA WITH PRESERVED VILLOUS ARCHITECTURE. - NEGATIVE FOR INTRA-EPITHELIAL LYMPHOCYTOSIS, DYSPLASIA AND MALIGNANCY.  B. STOMACH, ANTRUM AND BODY; COLD BIOPSY: - ANTRAL MUCOSA WITH MINIMAL CHRONIC GASTRITIS AND MILD FOVEOLAR HYPERPLASIA. - OXYNTIC MUCOSA WITH CHANGES  CONSISTENT WITH PROTON PUMP INHIBITOR EFFECT. - NEGATIVE FOR H. PYLORI, DYSPLASIA AND MALIGNANCY.  C. STOMACH POLYPS; COLD BIOPSY: - FUNDIC GLAND POLYPS. - NEGATIVE FOR H. PYLORI, DYSPLASIA, AND MALIGNANCY.  D.  GE JUNCTION; COLD BIOPSY: - SQUAMOCOLUMNAR MUCOSA WITH REFLUX GASTROESOPHAGITIS. - NEGATIVE FOR GOBLET CELLS, DYSPLASIA AND MALIGNANCY.  E. RANDOM COLON, RIGHT; COLD BIOPSY: - COLONIC MUCOSA NEGATIVE FOR MICROSCOPIC COLITIS, DYSPLASIA AND MALIGNANCY.  F. RANDOM COLON, LEFT; COLD BIOPSY: - COLONIC MUCOSA NEGATIVE FOR MICROSCOPIC COLITIS, DYSPLASIA AND MALIGNANCY.  G. COLON POLYP, RECTOSIGMOID JUNCTION; COLD SNARE: - FIBROBLASTIC POLYP. - NEGATIVE FOR DYSPLASIA AND MALIGNANCY.  H. RECTUM POLYP 2; COLD BIOPSY: - HYPERPLASTIC POLYPS (2). - NEGATIVE FOR DYSPLASIA AND MALIGNANCY.   Past Medical History:  Diagnosis Date   Abnormal chest x-ray 10/18/2018   Arthritis    Chronic pain 05/24/2015   Diastolic dysfunction 06/17/2016   GERD (gastroesophageal reflux disease)    Heart murmur    Hormone replacement therapy 05/26/2016   Hypertension    Hypertriglyceridemia 03/20/2022   Incidental lung nodule, > 3mm and < 8mm 08/04/2017   4 mm ground glass nodule LLL incidentally noted on CT scan Nov 2016   Osteoarthritis of both knees 06/13/2015   [facility]    Post menopausal syndrome 05/24/2015   Skin-picking disorder 05/24/2015   when nervous/stressed chews on fingers and peels down skin, started with cuticles but now hypopigmentation of fingers down to palm in some areas with lifetime of skin picking.     Past Surgical History:  Procedure Laterality Date   ABDOMINAL HYSTERECTOMY     COLONOSCOPY WITH PROPOFOL N/A 10/01/2015   Procedure: COLONOSCOPY WITH PROPOFOL;  Surgeon: Christena Deem, MD;  Location: [facility] ENDOSCOPY;  Service: Endoscopy;  Laterality: N/A;   ESOPHAGOGASTRODUODENOSCOPY (EGD) WITH PROPOFOL N/A 10/01/2015   Procedure: ESOPHAGOGASTRODUODENOSCOPY (EGD) WITH  PROPOFOL;  Surgeon: Christena Deem, MD;  Location: [facility] ENDOSCOPY;  Service: Endoscopy;  Laterality: N/A;     Current Outpatient Medications:    albuterol (VENTOLIN HFA) 108 (90 Base) MCG/ACT inhaler, Inhale 2 puffs into the lungs every 4 (four) hours as needed for wheezing or shortness of breath., Disp: 18 g, Rfl: 1   aspirin EC 81 MG tablet, Take 81 mg by mouth daily., Disp: , Rfl:    Blood Pressure Monitoring (ADULT BLOOD PRESSURE CUFF LG) KIT, 1 each by Does not apply route daily., Disp: 1 kit, Rfl: 0   Calcium Carbonate-Vitamin D 600-400 MG-UNIT tablet, Take 1 tablet by mouth daily. , Disp: , Rfl:    famotidine (PEPCID) 20 MG tablet, Take 1 tablet (20 mg total) by mouth 2 (two) times daily as needed for heartburn or indigestion., Disp: 60 tablet, Rfl: 2   fluticasone (FLONASE) 50 MCG/ACT nasal spray, Place 2 sprays into both nostrils daily., Disp: 16 g, Rfl: 6   levocetirizine (XYZAL) 5 MG tablet, Take 1 tablet (5 mg total) by mouth every evening., Disp: 90 tablet, Rfl: 1   losartan (COZAAR) 100 MG tablet, TAKE 1 TABLET BY MOUTH DAILY, Disp: 100 tablet, Rfl: 2   MULTIPLE VITAMIN PO, Take 1 tablet by mouth daily. , Disp: , Rfl:    pantoprazole (PROTONIX) 40 MG tablet, Take 1 tablet by mouth twice daily, Disp: 60 tablet, Rfl: 0   psyllium (METAMUCIL) 0.52 g capsule, Take 0.52 g by mouth daily., Disp: , Rfl:    TRI-LUMA 0.01-4-0.05 % CREA, SMARTSIG:1 Sparingly Topical Every Night, Disp: , Rfl:    triamcinolone ointment (KENALOG) 0.5 %, APPLY TOPICALLY TO AFFECTED  AREA(S) OF ECZEMA/RASHES TWICE  DAILY AS NEEDED . DO NOT APPLY  TO FACE, Disp: 30 g, Rfl: 0   TURMERIC PO, Take 1 tablet by mouth daily. , Disp: , Rfl:    nystatin (MYCOSTATIN/NYSTOP) powder, Apply 1 Application topically 3 (three) times daily between meals as needed (to affected skin fold/rash as needed). (Patient not taking: Reported on 05/19/2023), Disp: 60 g, Rfl: 1   Family History  Problem Relation Age of Onset   Heart  disease Mother    Diabetes Mother    Aneurysm Mother    Heart disease Father    Heart attack Father    Heart disease Sister    Diabetes Sister    Pneumonia Sister    Heart disease Brother    Diabetes Brother    Diabetes Sister    Heart disease Sister    Congestive Heart Failure Sister    Breast cancer Neg Hx      Social History   Tobacco Use   Smoking status: Never   Smokeless tobacco: Never   Tobacco comments:    smoking cessation materials not required  Vaping Use   Vaping Use: Never used  Substance Use Topics   Alcohol use: Not Currently    Alcohol/week: 0.0 standard drinks of alcohol   Drug use: No    Allergies as of 05/19/2023 - Review Complete 05/19/2023  Allergen Reaction Noted   Nitrofurantoin monohyd macro  05/24/2015    Review of Systems:    All systems reviewed and negative except where noted in HPI.   Physical Exam:  BP 132/78 (BP Location: Left Arm, Patient Position: Sitting, Cuff Size: Large)  Pulse 72   Temp 98.4 F (36.9 C) (Oral)   Ht 5\' 9"  (1.753 m)   Wt 216 lb 8 oz (98.2 kg)   BMI 31.97 kg/m  No LMP recorded. Patient has had a hysterectomy.  General:   Alert,  Well-developed, well-nourished, pleasant and cooperative in NAD Head:  Normocephalic and atraumatic. Eyes:  Sclera clear, no icterus.   Conjunctiva pink. Ears:  Normal auditory acuity. Nose:  No deformity, discharge, or lesions. Mouth:  No deformity or lesions,oropharynx pink & moist. Neck:  Supple; no masses or thyromegaly. Lungs:  Respirations even and unlabored.  Clear throughout to auscultation.   No wheezes, crackles, or rhonchi. No acute distress. Heart:  Regular rate and rhythm; no murmurs, clicks, rubs, or gallops. Abdomen:  Normal bowel sounds. Soft, non-tender and non-distended without masses, hepatosplenomegaly or hernias noted.  No guarding or rebound tenderness.   Rectal: Not performed Msk:  Symmetrical without gross deformities. Good, equal movement & strength  bilaterally. Pulses:  Normal pulses noted. Extremities:  No clubbing or edema.  No cyanosis. Neurologic:  Alert and oriented x3;  grossly normal neurologically. Skin:  Intact without significant lesions or rashes. No jaundice. Psych:  Alert and cooperative. Normal mood and affect.  Imaging Studies: Reviewed  Assessment and Plan:   Ruth Bishop is a 77 y.o. female with obesity, hypertension history of chronic GERD is seen in consultation for chronic abdominal bloating.  Labs including CBC, CMP, TSH, iron panel recommended.  Previous workup.  She does have intermittent constipation and does not appear to follow healthy diet Over the last several months, she also appears to lose interest in activities and she is concerned I have advised to discuss with her primary care provider to evaluate for depression I do not recommend any further workup at this time Strongly advised her to avoid eating unhealthy snacks, processed foods and incorporate healthy balanced meals   Follow up as needed   Arlyss Repress, MD

## 2023-05-24 ENCOUNTER — Ambulatory Visit (INDEPENDENT_AMBULATORY_CARE_PROVIDER_SITE_OTHER): Payer: Medicare Other | Admitting: Family Medicine

## 2023-05-24 ENCOUNTER — Encounter: Payer: Self-pay | Admitting: Family Medicine

## 2023-05-24 ENCOUNTER — Other Ambulatory Visit: Payer: Self-pay | Admitting: Family Medicine

## 2023-05-24 VITALS — BP 156/78 | HR 79 | Temp 98.2°F | Resp 16 | Ht 69.0 in | Wt 217.0 lb

## 2023-05-24 DIAGNOSIS — E669 Obesity, unspecified: Secondary | ICD-10-CM

## 2023-05-24 DIAGNOSIS — R1906 Epigastric swelling, mass or lump: Secondary | ICD-10-CM

## 2023-05-24 DIAGNOSIS — Z6832 Body mass index (BMI) 32.0-32.9, adult: Secondary | ICD-10-CM | POA: Diagnosis not present

## 2023-05-24 DIAGNOSIS — J069 Acute upper respiratory infection, unspecified: Secondary | ICD-10-CM | POA: Diagnosis not present

## 2023-05-24 DIAGNOSIS — R630 Anorexia: Secondary | ICD-10-CM

## 2023-05-24 DIAGNOSIS — Z7689 Persons encountering health services in other specified circumstances: Secondary | ICD-10-CM | POA: Diagnosis not present

## 2023-05-24 DIAGNOSIS — R051 Acute cough: Secondary | ICD-10-CM

## 2023-05-24 DIAGNOSIS — R140 Abdominal distension (gaseous): Secondary | ICD-10-CM

## 2023-05-24 MED ORDER — BENZONATATE 100 MG PO CAPS: 100.0000 mg | ORAL_CAPSULE | Freq: Three times a day (TID) | ORAL | 0 refills | Status: DC | PRN

## 2023-05-24 MED ORDER — SEMAGLUTIDE-WEIGHT MANAGEMENT 0.25 MG/0.5ML ~~LOC~~ SOAJ
0.2500 mg | SUBCUTANEOUS | 0 refills | Status: AC
Start: 2023-05-24 — End: 2023-06-21

## 2023-05-24 MED ORDER — SEMAGLUTIDE-WEIGHT MANAGEMENT 0.5 MG/0.5ML ~~LOC~~ SOAJ
0.5000 mg | SUBCUTANEOUS | 0 refills | Status: AC
Start: 2023-06-22 — End: 2023-07-20

## 2023-05-24 NOTE — Progress Notes (Signed)
Patient ID: Ruth Bishop, female    DOB: 12-Apr-1946, 77 y.o.   MRN: [id number]  PCP: Danelle Berry, PA-C  Chief Complaint  Patient presents with   Chest congestion    Over the weekend, has been OTC no help    Subjective:   Ruth Bishop is a 77 y.o. female, presents to clinic with CC of the following:  HPI   2 days URI sx chest congestion Coughing - Sometimes productive - sputum is white, she is concerned with hearing rattling in her chest She's tried Mucinex and [facility] cold and cough medication, but no improvement in the past 48 hours No fever, CP, sob, wheeze No sick contacts She does have chronic allergies/rhinitis and hx of reactive airways She has been taking her allergy medications and she has not tried her albuterol Sweats -but she reports she has menopausal sweats and it is hard to tell if it is different from her baseline  She asks about Wegovy for weight loss BMI is 32 patient is fairly active plays tennis  Due for her routine f/up today but she wished to do acute complaints She will reschedule - BP a little high today we will recheck when she returns and is well   Patient Active Problem List   Diagnosis Date Noted   Hypertriglyceridemia 03/20/2022   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   Obesity (BMI 30-39.9) 03/20/2022   Urinary frequency 10/21/2021   Lung nodules 03/26/2020   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   Elevated C-reactive protein (CRP) 02/14/2018   Elevated antinuclear antibody (ANA) level 02/14/2018   First degree AV block 09/17/2017   Left atrial enlargement 09/17/2017   Aortic regurgitation 06/17/2016   Mitral regurgitation 06/17/2016   Diastolic dysfunction 06/17/2016   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   Gastroesophageal reflux disease 05/24/2015   Hypertension goal BP (blood pressure) < 150/90 05/24/2015   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 , Disp: , Rfl:    famotidine (PEPCID) 20 MG tablet, Take 1 tablet (20 mg total) by mouth 2 (two) times daily as needed for heartburn or indigestion., Disp: 60 tablet, Rfl: 2   fluticasone (FLONASE) 50 MCG/ACT nasal spray, Place 2 sprays into both nostrils daily., Disp: 16 g, Rfl: 6   levocetirizine (XYZAL) 5 MG tablet, Take 1 tablet (5 mg total) by mouth every evening., Disp: 90 tablet, Rfl: 1   losartan (COZAAR) 100 MG tablet, TAKE 1 TABLET BY MOUTH DAILY, Disp: 100 tablet, Rfl: 2   MULTIPLE VITAMIN PO, Take 1 tablet by mouth daily. , Disp: , Rfl:    pantoprazole (PROTONIX) 40 MG tablet, Take 1 tablet by mouth twice daily, Disp: 60 tablet, Rfl: 0   psyllium (METAMUCIL) 0.52 g capsule, Take 0.52 g by mouth daily., Disp: , Rfl:    TRI-LUMA 0.01-4-0.05 % CREA, SMARTSIG:1 Sparingly Topical Every Night, Disp: , Rfl:    triamcinolone ointment (KENALOG) 0.5 %, APPLY  TOPICALLY TO AFFECTED  AREA(S) OF ECZEMA/RASHES TWICE  DAILY AS NEEDED . DO NOT APPLY  TO FACE, Disp: 30 g, Rfl: 0   TURMERIC PO, Take 1 tablet by mouth daily. , Disp: , Rfl:    nystatin (MYCOSTATIN/NYSTOP) powder, Apply 1 Application topically 3 (three) times daily between meals as needed (to affected skin fold/rash as needed). (Patient not taking: Reported on 05/19/2023), Disp: 60 g, Rfl: 1   Allergies  Allergen Reactions   Nitrofurantoin Monohyd Macro      Social History   Tobacco Use   Smoking status: Never   Smokeless tobacco: Never   Tobacco comments:    smoking cessation materials not  required  Vaping Use   Vaping Use: Never used  Substance Use Topics   Alcohol use: Not Currently    Alcohol/week: 0.0 standard drinks of alcohol   Drug use: No      Chart Review Today: I personally reviewed active problem list, medication list, allergies, family history, social history, health maintenance, notes from last encounter, lab results, imaging with the patient/caregiver today.   Review of Systems  Constitutional: Negative.   HENT: Negative.    Eyes: Negative.   Respiratory: Negative.    Cardiovascular: Negative.   Gastrointestinal: Negative.   Endocrine: Negative.   Genitourinary: Negative.   Musculoskeletal: Negative.   Skin: Negative.   Allergic/Immunologic: Negative.   Neurological: Negative.   Hematological: Negative.   Psychiatric/Behavioral: Negative.    All other systems reviewed and are negative.      Objective:   Vitals:   05/24/23 1406  BP: (!) 160/82  Pulse: 79  Resp: 16  Temp: 98.2 F (36.8 C)  TempSrc: Oral  SpO2: 98%  Weight: 217 lb (98.4 kg)  Height: 5\' 9"  (1.753 m)    Body mass index is 32.05 kg/m.  Physical Exam Vitals and nursing note reviewed.  Constitutional:      General: She is not in acute distress.    Appearance: She is well-developed. She is obese. She is not ill-appearing, toxic-appearing or diaphoretic.  HENT:     Head: Normocephalic and atraumatic.     Right Ear: Tympanic membrane, ear canal and external ear normal. There is no impacted cerumen.     Left Ear: Tympanic membrane, ear canal and external ear normal. There is no impacted cerumen.     Nose: Congestion present. No rhinorrhea.     Mouth/Throat:     Mouth: Mucous membranes are moist.     Pharynx: Oropharynx is clear. No oropharyngeal exudate or posterior oropharyngeal erythema.  Eyes:     General: No scleral icterus.       Right eye: No discharge.        Left eye: No discharge.     Conjunctiva/sclera: Conjunctivae normal.  Neck:     Trachea: No tracheal  deviation.  Cardiovascular:     Rate and Rhythm: Normal rate and regular rhythm.     Pulses: Normal pulses.     Heart sounds: Normal heart sounds. No murmur heard.    No friction rub. No gallop.  Pulmonary:     Effort: Pulmonary effort is normal. No tachypnea, accessory muscle usage, prolonged expiration, respiratory distress or retractions.     Breath sounds: Normal breath sounds. No stridor, decreased air movement or transmitted upper airway sounds. No decreased breath sounds, wheezing, rhonchi or rales.     Comments: Intermittent cough throughout visit, no coughing fits Musculoskeletal:        General: Normal range  of motion.  Skin:    General: Skin is warm and dry.     Findings: No rash.  Neurological:     Mental Status: She is alert.     Motor: No abnormal muscle tone.     Coordination: Coordination normal.  Psychiatric:        Behavior: Behavior normal.      Results for orders placed or performed in visit on 01/28/23  CBC with Differential/Platelet  Result Value Ref Range   WBC 6.0 3.8 - 10.8 Thousand/uL   RBC 5.13 (H) 3.80 - 5.10 Million/uL   Hemoglobin 13.1 11.7 - 15.5 g/dL   HCT 16.1 09.6 - 04.5 %   MCV 78.6 (L) 80.0 - 100.0 fL   MCH 25.5 (L) 27.0 - 33.0 pg   MCHC 32.5 32.0 - 36.0 g/dL   RDW 40.9 (H) 81.1 - 91.4 %   Platelets 274 140 - 400 Thousand/uL   MPV 10.6 7.5 - 12.5 fL   Neutro Abs 2,610 1,500 - 7,800 cells/uL   Lymphs Abs 2,742 850 - 3,900 cells/uL   Absolute Monocytes 450 200 - 950 cells/uL   Eosinophils Absolute 168 15 - 500 cells/uL   Basophils Absolute 30 0 - 200 cells/uL   Neutrophils Relative % 43.5 %   Total Lymphocyte 45.7 %   Monocytes Relative 7.5 %   Eosinophils Relative 2.8 %   Basophils Relative 0.5 %  COMPLETE METABOLIC PANEL WITH GFR  Result Value Ref Range   Glucose, Bld 84 65 - 99 mg/dL   BUN 18 7 - 25 mg/dL   Creat 7.82 9.56 - 2.13 mg/dL   eGFR 73 > OR = 60 YQ/MVH/8.46N6   BUN/Creatinine Ratio SEE NOTE: 6 - 22 (calc)   Sodium  143 135 - 146 mmol/L   Potassium 4.4 3.5 - 5.3 mmol/L   Chloride 106 98 - 110 mmol/L   CO2 28 20 - 32 mmol/L   Calcium 9.5 8.6 - 10.4 mg/dL   Total Protein 7.0 6.1 - 8.1 g/dL   Albumin 3.9 3.6 - 5.1 g/dL   Globulin 3.1 1.9 - 3.7 g/dL (calc)   AG Ratio 1.3 1.0 - 2.5 (calc)   Total Bilirubin 0.3 0.2 - 1.2 mg/dL   Alkaline phosphatase (APISO) 95 37 - 153 U/L   AST 16 10 - 35 U/L   ALT 12 6 - 29 U/L  TSH  Result Value Ref Range   TSH 1.44 0.40 - 4.50 mIU/L  Fecal Globin By Immunochemistry  Result Value Ref Range   MICRO NUMBER: [id number]    SPECIMEN QUALITY: Adequate    Source: INSURE (TM) FOBT TEST CARD    STATUS: FINAL    FECAL GLOBIN RESULT: Not Detected   Vitamin B12  Result Value Ref Range   Vitamin B-12 1,497 (H) 200 - 1,100 pg/mL  Iron, TIBC and Ferritin Panel  Result Value Ref Range   Iron 75 45 - 160 mcg/dL   TIBC 244 010 - 272 mcg/dL (calc)   %SAT 24 16 - 45 % (calc)   Ferritin 54 16 - 288 ng/mL  Pathologist smear review  Result Value Ref Range   Path Review         Assessment & Plan:   1. Upper respiratory tract infection, unspecified type 2d of symptoms, post nasal drip and intermittent coughing which is sometimes productive She has not had any headache fever sweats chills body aches shortness of breath Her lungs are clear and she is well-appearing Encouraged  her to continue her allergy medications, continue Mucinex and other over-the-counter cough medicines, use her inhaler if she gets wheezy or tight in her chest -she is very concerned that she hears a rattle -I have explained that this is likely some mucus in her big airways and coughing has cleared the mucus - I reassured her this is common with URI/chest cold and as long as she is not having trouble breathing or pain with breathing it may be self resolving   2. Acute cough Lungs CTA A&P, sx mild but pt very anxious about it Encouraged her to use mucinex, delsym, she can try tessalon, continue her daily  allergy meds and use inhaler if cough gets tight/wheezy or if she gets SOB - and I would also want her to f/up if there is any worsening At this time I did not feel like she needed a CXR- supportive and symptomatic measures and watchful waiting appropriate  3. Class 1 obesity with body mass index (BMI) of 32.0 to 32.9 in adult, unspecified obesity type, unspecified whether serious comorbidity present She asks me to prescribe wegovy so she can loose some weight I explained to the medications are recommended in conjunction with diet efforts including calorie reduction and also increased physical exercise/activity I reviewed the medication MOA and common side effects She has no contraindications including no history of thyroid cancer or pancreatitis She states she does not think her insurance will pay for it and I have explained that they either cover or do not cover the injectables for weight management and she can inquire at the pharmacy about the cash price or coupons  - Semaglutide-Weight Management 0.25 MG/0.5ML SOAJ; Inject 0.25 mg into the skin once a week for 28 days.  Dispense: 2 mL; Refill: 0 - Semaglutide-Weight Management 0.5 MG/0.5ML SOAJ; Inject 0.5 mg into the skin once a week for 28 days.  Dispense: 2 mL; Refill: 0  4. Encounter for weight management See above - Semaglutide-Weight Management 0.25 MG/0.5ML SOAJ; Inject 0.25 mg into the skin once a week for 28 days.  Dispense: 2 mL; Refill: 0 - Semaglutide-Weight Management 0.5 MG/0.5ML SOAJ; Inject 0.5 mg into the skin once a week for 28 days.  Dispense: 2 mL; Refill: 0      Danelle Berry, PA-C 05/24/23 2:17 PM

## 2023-06-03 ENCOUNTER — Telehealth: Payer: Self-pay | Admitting: Family Medicine

## 2023-06-03 NOTE — Telephone Encounter (Signed)
Copied from CRM #[id number]. Topic: General - Inquiry >> Jun 03, 2023 11:44 AM Teressa P wrote: Reason for CRM: pt called saying the pharmacy told her they were waiting for the PA on the Ozempic for 05/24/23 

## 2023-06-03 NOTE — Telephone Encounter (Signed)
Copied from CRM [id number]. Topic: General - Inquiry >> Jun 03, 2023 11:44 AM Ruth Bishop wrote: Reason for CRM: pt called saying the pharmacy told her they were waiting for the PA on the Ozempic for 05/24/23

## 2023-06-03 NOTE — Telephone Encounter (Signed)
PA done through covermymeds for Wegovy. Waiting for determination

## 2023-06-04 NOTE — Telephone Encounter (Signed)
[facility] (Key: [id number]) - [id number] OACZYS 0.25MG /0.5ML auto-injectors Status: PA Response - DeniedCreated: June 27th, 2024 [id number] Sent: June 27th, 2024   Pt is aware, she stats she will pay out of pocket.

## 2023-06-07 ENCOUNTER — Ambulatory Visit: Payer: Medicare Other | Admitting: Family Medicine

## 2023-06-14 ENCOUNTER — Encounter: Payer: Self-pay | Admitting: Family Medicine

## 2023-06-14 ENCOUNTER — Ambulatory Visit (INDEPENDENT_AMBULATORY_CARE_PROVIDER_SITE_OTHER): Payer: Medicare Other | Admitting: Family Medicine

## 2023-06-14 VITALS — BP 138/74 | HR 72 | Temp 97.7°F | Resp 16 | Ht 69.0 in | Wt 216.7 lb

## 2023-06-14 DIAGNOSIS — E669 Obesity, unspecified: Secondary | ICD-10-CM

## 2023-06-14 DIAGNOSIS — J683 Other acute and subacute respiratory conditions due to chemicals, gases, fumes and vapors: Secondary | ICD-10-CM | POA: Diagnosis not present

## 2023-06-14 DIAGNOSIS — R7303 Prediabetes: Secondary | ICD-10-CM | POA: Diagnosis not present

## 2023-06-14 DIAGNOSIS — K219 Gastro-esophageal reflux disease without esophagitis: Secondary | ICD-10-CM | POA: Diagnosis not present

## 2023-06-14 DIAGNOSIS — E781 Pure hyperglyceridemia: Secondary | ICD-10-CM

## 2023-06-14 DIAGNOSIS — R718 Other abnormality of red blood cells: Secondary | ICD-10-CM

## 2023-06-14 DIAGNOSIS — Z6832 Body mass index (BMI) 32.0-32.9, adult: Secondary | ICD-10-CM

## 2023-06-14 DIAGNOSIS — I10 Essential (primary) hypertension: Secondary | ICD-10-CM | POA: Diagnosis not present

## 2023-06-14 NOTE — Progress Notes (Signed)
Name: Ruth Bishop   MRN: [id number]    DOB: [birth year]   Date:06/14/2023       Progress Note  Chief Complaint  Patient presents with   Follow-up   Hypertension   Gastroesophageal Reflux   PreDM   Subjective:   Ruth Bishop is a 77 y.o. female, presents to clinic for routine f/up  Prediabetes - not on meds  Lab Results  Component Value Date   HGBA1C 5.7 (H) 09/16/2022   Hypertension:  Currently managed on losartan 100 mg dose increased in the past couple months, BP is still slightly above goal Pt reports good med compliance and denies any SE.   Blood pressure today is well controlled.   She is not exercising as much as she did previously BP Readings from Last 3 Encounters:  06/14/23 138/74  05/24/23 (!) 156/78  05/19/23 132/78   Pt denies CP, SOB, exertional sx, LE edema, palpitation, Ha's, visual disturbances, lightheadedness, hypotension, syncope.   Not on meds Last Lipids: Lab Results  Component Value Date   CHOL 150 03/20/2022   HDL 59 03/20/2022   LDLCALC 71 03/20/2022   TRIG 114 03/20/2022   CHOLHDL 2.5 03/20/2022  The 10-year ASCVD risk score (Arnett DK, et al., 2019) is: 13%   Values used to calculate the score:     Age: 6 years     Sex: Female     Is Non-Hispanic African American: Yes     Diabetic: No     Tobacco smoker: No     Systolic Blood Pressure: 138 mmHg     Is BP treated: Yes     HDL Cholesterol: 59 mg/dL     Total Cholesterol: 150 mg/dL  - Denies: Chest pain, shortness of breath, myalgias, claudication   Intermittent lo back pain and thigh/joint pain she takes naproxen for it  Mood/fatigue - a little better - she feels like it was due to winter weather and being isolation She has planned more social things, visited with family, is starting to feel beter    06/14/2023    1:49 PM 05/24/2023    2:05 PM 03/01/2023   11:02 AM  Depression screen PHQ 2/9  Decreased Interest 0 1 0  Down, Depressed, Hopeless 0 1 0  PHQ - 2 Score 0 2 0   Altered sleeping 0 1 0  Tired, decreased energy 0 1 0  Change in appetite 0 0 0  Feeling bad or failure about yourself  0 0 0  Trouble concentrating 0 0 0  Moving slowly or fidgety/restless 0 0 0  Suicidal thoughts 0 0 0  PHQ-9 Score 0 4 0  Difficult doing work/chores Not difficult at all Somewhat difficult Not difficult at all         Current Outpatient Medications:    albuterol (VENTOLIN HFA) 108 (90 Base) MCG/ACT inhaler, Inhale 2 puffs into the lungs every 4 (four) hours as needed for wheezing or shortness of breath., Disp: 18 g, Rfl: 1   aspirin EC 81 MG tablet, Take 81 mg by mouth daily., Disp: , Rfl:    Blood Pressure Monitoring (ADULT BLOOD PRESSURE CUFF LG) KIT, 1 each by Does not apply route daily., Disp: 1 kit, Rfl: 0   Calcium Carbonate-Vitamin D 600-400 MG-UNIT tablet, Take 1 tablet by mouth daily. , Disp: , Rfl:    famotidine (PEPCID) 20 MG tablet, Take 1 tablet (20 mg total) by mouth 2 (two) times daily as needed for heartburn or indigestion., Disp: 60  tablet, Rfl: 2   fluticasone (FLONASE) 50 MCG/ACT nasal spray, Place 2 sprays into both nostrils daily., Disp: 16 g, Rfl: 6   levocetirizine (XYZAL) 5 MG tablet, Take 1 tablet (5 mg total) by mouth every evening., Disp: 90 tablet, Rfl: 1   losartan (COZAAR) 100 MG tablet, TAKE 1 TABLET BY MOUTH DAILY, Disp: 100 tablet, Rfl: 2   MULTIPLE VITAMIN PO, Take 1 tablet by mouth daily. , Disp: , Rfl:    nystatin (MYCOSTATIN/NYSTOP) powder, Apply 1 Application topically 3 (three) times daily between meals as needed (to affected skin fold/rash as needed)., Disp: 60 g, Rfl: 1   pantoprazole (PROTONIX) 40 MG tablet, Take 1 tablet by mouth twice daily, Disp: 60 tablet, Rfl: 0   psyllium (METAMUCIL) 0.52 g capsule, Take 0.52 g by mouth daily., Disp: , Rfl:    TRI-LUMA 0.01-4-0.05 % CREA, SMARTSIG:1 Sparingly Topical Every Night, Disp: , Rfl:    triamcinolone ointment (KENALOG) 0.5 %, APPLY TOPICALLY TO AFFECTED  AREA(S) OF  ECZEMA/RASHES TWICE  DAILY AS NEEDED . DO NOT APPLY  TO FACE, Disp: 30 g, Rfl: 0   TURMERIC PO, Take 1 tablet by mouth daily. , Disp: , Rfl:    benzonatate (TESSALON) 100 MG capsule, Take 1-2 capsules (100-200 mg total) by mouth 3 (three) times daily as needed for cough. (Patient not taking: Reported on 06/14/2023), Disp: 30 capsule, Rfl: 0   Semaglutide-Weight Management 0.25 MG/0.5ML SOAJ, Inject 0.25 mg into the skin once a week for 28 days. (Patient not taking: Reported on 06/14/2023), Disp: 2 mL, Rfl: 0   [START ON 06/22/2023] Semaglutide-Weight Management 0.5 MG/0.5ML SOAJ, Inject 0.5 mg into the skin once a week for 28 days. 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   Elevated C-reactive protein (CRP) 02/14/2018   Elevated antinuclear antibody (ANA) level 02/14/2018   First degree AV block 09/17/2017   Left atrial enlargement 09/17/2017   Aortic regurgitation 06/17/2016   Mitral regurgitation 06/17/2016   Diastolic dysfunction 06/17/2016   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   Gastroesophageal reflux disease 05/24/2015   Hypertension goal BP (blood pressure) < 150/90 05/24/2015   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    Past Surgical History:  Procedure Laterality Date   ABDOMINAL HYSTERECTOMY     COLONOSCOPY WITH PROPOFOL N/A 10/01/2015   Procedure: COLONOSCOPY WITH PROPOFOL;  Surgeon: Christena Deem, MD;  Location: [facility] ENDOSCOPY;  Service: Endoscopy;  Laterality: N/A;   ESOPHAGOGASTRODUODENOSCOPY (EGD) WITH PROPOFOL N/A 10/01/2015   Procedure: ESOPHAGOGASTRODUODENOSCOPY (EGD) WITH PROPOFOL;  Surgeon: Christena Deem, MD;  Location: [facility] ENDOSCOPY;  Service:  Endoscopy;  Laterality: N/A;    Family History  Problem Relation Age of Onset   Heart disease Mother    Diabetes Mother    Aneurysm Mother    Heart disease Father    Heart attack Father    Heart disease Sister    Diabetes Sister    Pneumonia Sister    Heart disease Brother    Diabetes Brother    Diabetes Sister    Heart disease Sister    Congestive Heart Failure Sister    Breast cancer Neg Hx     Social History   Tobacco Use  Smoking status: Never   Smokeless tobacco: Never   Tobacco comments:    smoking cessation materials not required  Vaping Use   Vaping Use: Never used  Substance Use Topics   Alcohol use: Not Currently    Alcohol/week: 0.0 standard drinks of alcohol   Drug use: No     Allergies  Allergen Reactions   Nitrofurantoin Monohyd Macro     Health Maintenance  Topic Date Due   COVID-19 Vaccine (6 - 2023-24 season) 06/30/2023 (Originally 11/02/2022)   INFLUENZA VACCINE  07/08/2023   MAMMOGRAM  10/10/2023   Medicare Annual Wellness (AWV)  01/02/2024   DTaP/Tdap/Td (2 - Td or Tdap) 05/23/2025   Colonoscopy  09/30/2025   Pneumonia Vaccine 30+ Years old  Completed   DEXA SCAN  Completed   Hepatitis C Screening  Completed   Zoster Vaccines- Shingrix  Completed   HPV VACCINES  Aged Out    Chart Review Today: I personally reviewed active problem list, medication list, allergies, family history, social history, health maintenance, notes from last encounter, lab results, imaging with the patient/caregiver today.   Review of Systems  Constitutional: Negative.   HENT: Negative.    Eyes: Negative.   Respiratory: Negative.    Cardiovascular: Negative.   Gastrointestinal: Negative.   Endocrine: Negative.   Genitourinary: Negative.   Musculoskeletal: Negative.   Skin: Negative.   Allergic/Immunologic: Negative.   Neurological: Negative.   Hematological: Negative.   Psychiatric/Behavioral: Negative.    All other systems reviewed and are  negative.    Objective:   Vitals:   06/14/23 1349  BP: 138/74  Pulse: 72  Resp: 16  Temp: 97.7 F (36.5 C)  TempSrc: Oral  SpO2: 98%  Weight: 216 lb 11.2 oz (98.3 kg)  Height: 5\' 9"  (1.753 m)    Body mass index is 32 kg/m.  Physical Exam Vitals and nursing note reviewed.  Constitutional:      General: She is not in acute distress.    Appearance: Normal appearance. She is well-developed, well-groomed and overweight. She is not ill-appearing, toxic-appearing or diaphoretic.  HENT:     Head: Normocephalic and atraumatic.     Nose: Nose normal.  Eyes:     General:        Right eye: No discharge.        Left eye: No discharge.     Conjunctiva/sclera: Conjunctivae normal.  Neck:     Trachea: No tracheal deviation.  Cardiovascular:     Rate and Rhythm: Normal rate and regular rhythm.  Pulmonary:     Effort: Pulmonary effort is normal. No respiratory distress.     Breath sounds: No stridor.  Abdominal:     General: Abdomen is protuberant. Bowel sounds are normal. There is no distension.     Palpations: Abdomen is soft.     Tenderness: There is no abdominal tenderness.  Musculoskeletal:        General: Normal range of motion.  Skin:    General: Skin is warm and dry.     Findings: No rash.  Neurological:     Mental Status: She is alert.     Motor: No abnormal muscle tone.     Coordination: Coordination normal.  Psychiatric:        Attention and Perception: Attention normal.        Mood and Affect: Mood and affect normal. Mood is not depressed.        Speech: Speech normal.  Behavior: Behavior normal. Behavior is cooperative.        Thought Content: Thought content normal.         Assessment & Plan:   Problem List Items Addressed This Visit       Cardiovascular and Mediastinum   Primary hypertension - Primary    Pt is still not at goal, but improved some with the losartan 100 mg dose She is less active that her normal and that is likely a factor I  reviewed with pt BP goals, at this time she does not want additional meds, we discussed diet/lifestyle efforts in addition to losartan BP Readings from Last 3 Encounters:  06/14/23 138/74  05/24/23 (!) 156/78  05/19/23 132/78         Relevant Orders   COMPLETE METABOLIC PANEL WITH GFR (Completed)     Respiratory   Reactive airways dysfunction syndrome (HCC)    Sx have been managed with allergy meds and prn rescue inhaler Recent URI illness, if she has more exacerbations would consider pt try airsupra or addition of maintenance inhaler        Digestive   Gastroesophageal reflux disease    Intermittently needing PPI        Other   Hypertriglyceridemia    Recheck labs, pt not on meds      Relevant Orders   COMPLETE METABOLIC PANEL WITH GFR (Completed)   Lipid panel (Completed)   Class 1 obesity with body mass index (BMI) of 32.0 to 32.9 in adult    Pt has again asked for weight loss medications No contraindication to wegovy/ozempic but I am concerned she may have intolerable GI SE with her hx Wt Readings from Last 5 Encounters:  06/14/23 216 lb 11.2 oz (98.3 kg)  05/24/23 217 lb (98.4 kg)  05/19/23 216 lb 8 oz (98.2 kg)  03/01/23 213 lb 12.8 oz (97 kg)  02/08/23 212 lb 8 oz (96.4 kg)   BMI Readings from Last 5 Encounters:  06/14/23 32.00 kg/m  05/24/23 32.05 kg/m  05/19/23 31.97 kg/m  03/01/23 31.57 kg/m  02/08/23 31.38 kg/m   Associated comorbidities including prediabetes, HTN, AR/MR, CHF/DD, gerd      Relevant Orders   COMPLETE METABOLIC PANEL WITH GFR (Completed)   Hemoglobin A1c (Completed)   Lipid panel (Completed)   Other Visit Diagnoses     Prediabetes       Relevant Orders   COMPLETE METABOLIC PANEL WITH GFR (Completed)   Hemoglobin A1c (Completed)   Microcytosis       recheck labs   Relevant Orders   CBC with Differential/Platelet (Completed)        Return in about 6 months (around 12/15/2023) for Routine follow-up.   Danelle Berry,  PA-C 06/14/23 2:06 PM

## 2023-06-15 LAB — COMPLETE METABOLIC PANEL WITH GFR
AG Ratio: 1.3 (calc) (ref 1.0–2.5)
ALT: 10 U/L (ref 6–29)
AST: 13 U/L (ref 10–35)
Albumin: 3.9 g/dL (ref 3.6–5.1)
Alkaline phosphatase (APISO): 90 U/L (ref 37–153)
BUN: 16 mg/dL (ref 7–25)
CO2: 30 mmol/L (ref 20–32)
Calcium: 9.6 mg/dL (ref 8.6–10.4)
Chloride: 106 mmol/L (ref 98–110)
Creat: 0.96 mg/dL (ref 0.60–1.00)
Globulin: 3.1 g/dL (calc) (ref 1.9–3.7)
Glucose, Bld: 99 mg/dL (ref 65–139)
Potassium: 4.4 mmol/L (ref 3.5–5.3)
Sodium: 141 mmol/L (ref 135–146)
Total Bilirubin: 0.4 mg/dL (ref 0.2–1.2)
Total Protein: 7 g/dL (ref 6.1–8.1)
eGFR: 62 mL/min/{1.73_m2} (ref >=60)

## 2023-06-15 LAB — CBC WITH DIFFERENTIAL/PLATELET
Absolute Monocytes: 462 cells/uL (ref 200–950)
Basophils Absolute: 39 cells/uL (ref 0–200)
Basophils Relative: 0.6 %
Eosinophils Absolute: 234 cells/uL (ref 15–500)
Eosinophils Relative: 3.6 %
HCT: 39.9 % (ref 35.0–45.0)
Hemoglobin: 12.8 g/dL (ref 11.7–15.5)
Lymphs Abs: 2873 cells/uL (ref 850–3900)
MCH: 25.9 pg — ABNORMAL LOW (ref 27.0–33.0)
MCHC: 32.1 g/dL (ref 32.0–36.0)
MCV: 80.6 fL (ref 80.0–100.0)
MPV: 11.1 fL (ref 7.5–12.5)
Monocytes Relative: 7.1 %
Neutro Abs: 2893 cells/uL (ref 1500–7800)
Neutrophils Relative %: 44.5 %
Platelets: 312 10*3/uL (ref 140–400)
RBC: 4.95 10*6/uL (ref 3.80–5.10)
RDW: 14.4 % (ref 11.0–15.0)
Total Lymphocyte: 44.2 %
WBC: 6.5 10*3/uL (ref 3.8–10.8)

## 2023-06-15 LAB — LIPID PANEL
Cholesterol: 146 mg/dL (ref <200)
HDL: 47 mg/dL — ABNORMAL LOW (ref >=50)
LDL Cholesterol (Calc): 69 mg/dL (calc)
Non-HDL Cholesterol (Calc): 99 mg/dL (calc) (ref <130)
Total CHOL/HDL Ratio: 3.1 (calc) (ref <5.0)
Triglycerides: 239 mg/dL — ABNORMAL HIGH (ref <150)

## 2023-06-15 LAB — HEMOGLOBIN A1C
Hgb A1c MFr Bld: 6 % of total Hgb — ABNORMAL HIGH (ref <5.7)
Mean Plasma Glucose: 126 mg/dL
eAG (mmol/L): 7 mmol/L

## 2023-06-18 ENCOUNTER — Encounter: Payer: Self-pay | Admitting: Family Medicine

## 2023-06-18 NOTE — Assessment & Plan Note (Signed)
Pt is still not at goal, but improved some with the losartan 100 mg dose She is less active that her normal and that is likely a factor I reviewed with pt BP goals, at this time she does not want additional meds, we discussed diet/lifestyle efforts in addition to losartan BP Readings from Last 3 Encounters:  06/14/23 138/74  05/24/23 (!) 156/78  05/19/23 132/78

## 2023-06-18 NOTE — Assessment & Plan Note (Signed)
Pt has again asked for weight loss medications No contraindication to wegovy/ozempic but I am concerned she may have intolerable GI SE with her hx Wt Readings from Last 5 Encounters:  06/14/23 216 lb 11.2 oz (98.3 kg)  05/24/23 217 lb (98.4 kg)  05/19/23 216 lb 8 oz (98.2 kg)  03/01/23 213 lb 12.8 oz (97 kg)  02/08/23 212 lb 8 oz (96.4 kg)   BMI Readings from Last 5 Encounters:  06/14/23 32.00 kg/m  05/24/23 32.05 kg/m  05/19/23 31.97 kg/m  03/01/23 31.57 kg/m  02/08/23 31.38 kg/m   Associated comorbidities including prediabetes, HTN, AR/MR, CHF/DD, gerd

## 2023-06-18 NOTE — Assessment & Plan Note (Signed)
Recheck labs, pt not on meds

## 2023-06-18 NOTE — Assessment & Plan Note (Signed)
Intermittently needing PPI

## 2023-06-18 NOTE — Assessment & Plan Note (Signed)
Sx have been managed with allergy meds and prn rescue inhaler Recent URI illness, if she has more exacerbations would consider pt try airsupra or addition of maintenance inhaler

## 2023-07-02 ENCOUNTER — Telehealth: Payer: Self-pay | Admitting: Family Medicine

## 2023-07-02 NOTE — Telephone Encounter (Unsigned)
Copied from CRM [id number]. Topic: General - Other >> Jul 02, 2023 11:10 AM Macon Large wrote: Reason for CRM: Pt called for an update on the form that she left during her last visit. Pt would like to know if the form was completed and faxed back. Cb# [phone]

## 2023-07-02 NOTE — Telephone Encounter (Signed)
Form faxed 06/25/23. Patient aware

## 2023-07-06 NOTE — Telephone Encounter (Signed)
Pt called in says form that was sent from  [facility] , want filled out correctly. She says she was told for Dr Angelica Chessman to leave the ICD 10 code box blank and resend.

## 2023-07-06 NOTE — Telephone Encounter (Signed)
Called pt back and she stated she was told [facility] staff that form was filled out incorrectly, we should have not put anything in ICD 10 code space and they will be faxing another form to our office. I advised pt that a form can not be submitted without a DX code. Advised we will try our best to fill it out as appropriate based on her medical.

## 2023-07-29 ENCOUNTER — Other Ambulatory Visit: Payer: Self-pay | Admitting: Family Medicine

## 2023-07-29 DIAGNOSIS — R630 Anorexia: Secondary | ICD-10-CM

## 2023-07-29 DIAGNOSIS — R140 Abdominal distension (gaseous): Secondary | ICD-10-CM

## 2023-07-29 DIAGNOSIS — R1906 Epigastric swelling, mass or lump: Secondary | ICD-10-CM

## 2023-07-29 NOTE — Telephone Encounter (Signed)
Medication Refill - Medication:  pantoprazole (PROTONIX) 40 MG tablet a few left   Has the patient contacted their pharmacy? Yes.   Advised to contact PCP  Preferred Pharmacy (with phone number or street name):  [facility] [address] [place], [place] - [address]  Phone: [phone] Fax: [phone]    Has the patient been seen for an appointment in the last year OR does the patient have an upcoming appointment? YES, CPE Scheduled for 12.17.2024

## 2023-07-30 MED ORDER — PANTOPRAZOLE SODIUM 40 MG PO TBEC
40.0000 mg | DELAYED_RELEASE_TABLET | Freq: Two times a day (BID) | ORAL | 0 refills | Status: DC
Start: 2023-07-30 — End: 2023-11-18

## 2023-07-30 NOTE — Telephone Encounter (Signed)
Requested Prescriptions  Pending Prescriptions Disp Refills   pantoprazole (PROTONIX) 40 MG tablet 180 tablet 0    Sig: Take 1 tablet (40 mg total) by mouth 2 (two) times daily.     Gastroenterology: Proton Pump Inhibitors Passed - 07/29/2023  3:57 PM      Passed - Valid encounter within last 12 months    Recent Outpatient Visits           1 month ago Primary hypertension   [facility] Danelle Berry, PA-C   2 months ago Upper respiratory tract infection, unspecified type   [facility] Danelle Berry, PA-C   5 months ago Abdominal bloating   [facility] [place], Sheliah Mends, PA-C   5 months ago Hypertension goal BP (blood pressure) < 150/90   [facility] Danelle Berry, PA-C   6 months ago Hypertension goal BP (blood pressure) < 150/90   [facility] Danelle Berry, PA-C       Future Appointments             In 1 month Gollan, Tollie Pizza, MD [facility] [place]   In 3 months  [place]   In 3 months Danelle Berry, PA-C [facility], [facility]   In 4 months Danelle Berry, PA-C [facility]

## 2023-08-30 ENCOUNTER — Other Ambulatory Visit: Payer: Self-pay | Admitting: Family Medicine

## 2023-08-30 DIAGNOSIS — Z1231 Encounter for screening mammogram for malignant neoplasm of breast: Secondary | ICD-10-CM

## 2023-09-05 NOTE — Progress Notes (Unsigned)
Cardiology Office Note  Date:  09/06/2023   ID:  Ruth Bishop, DOB 1946/09/11, MRN [id number]  PCP:  Danelle Berry, PA-C   Chief Complaint  Patient presents with   12 month follow up     "Doing well." Medications reviewed by the patient verbally.     HPI:  Ruth Bishop is a 77 year old woman with past medical history of HTN Fatigue Chest pain Who presents for follow-up of her chest pain sx, costochondritis  Last seen by myself in clinic June 2023  In a new relationship Just got back from cruise, [place]  Was Playing tennis, hurting legs, Now does low impact exercise  BP elevated at home, often 150 systolic or higher Has to sit for long time for numbers to come down  No significant chest pain concerning for angina  Lab work reviewed Total cholesterol 146 triglycerides 239 up from 114 LDL 69 A1c 6.0  EKG personally reviewed by myself on todays visit EKG Interpretation Date/Time:  Monday September 06 2023 09:08:25 EDT Ventricular Rate:  68 PR Interval:  170 QRS Duration:  82 QT Interval:  410 QTC Calculation: 435 R Axis:   48  Text Interpretation: Normal sinus rhythm Minimal voltage criteria for LVH, may be normal variant ( Sokolow-Lyon ) When compared with ECG of 28-Jun-2019 11:01, Vent. rate has decreased BY  35 BPM Nonspecific T wave abnormality no longer evident in Lateral leads Confirmed by Julien Nordmann [id number]) on 09/06/2023 9:13:21 AM   Other past medical history reviewed Seen by rheumatology at [facility], then [facility] Elevated CRP and inflammatory markers Reports symptoms are typically well controlled with heating pack, NSAIDs  Remote history of having epidural injection to her chest for pain relief   No chest pain or shortness of breath on exertion  CT scan images from 2010 reviewed showing no PAD, aortic atherosclerosis, no coronary calcification  CT scan 02/07/2018 No coronary calcifications, no aortic atherosclerosis  CT scan 08/2017, images pulled  up in the office As above no coronary calcification or aortic atherosclerosis  Echo 10/2017 reviewed with her in detail Left ventricle: The cavity size was normal. Systolic function was normal. The estimated ejection fraction was in the range of 60% to 65%. Wall motion was normal; there were no regional wall motion abnormalities. Doppler parameters are consistent with abnormal left ventricular relaxation (grade 1 diastolic dysfunction). - Aortic valve: There was mild regurgitation. - Left atrium: The atrium was normal in size. - Right ventricle: Systolic function was normal. - Pulmonary arteries: Systolic pressure was within the normal   range.  Previous records have been requested, reviewed today Echocardiogram from June 2014 was essentially normal ejection fraction 60-65%  PMH:   has a past medical history of Abnormal chest x-ray (10/18/2018), Arthritis, Chronic pain (05/24/2015), Diastolic dysfunction (06/17/2016), GERD (gastroesophageal reflux disease), Heart murmur, Hormone replacement therapy (05/26/2016), Hypertension, Hypertriglyceridemia (03/20/2022), Incidental lung nodule, > 3mm and < 8mm (08/04/2017), Osteoarthritis of both knees (06/13/2015), Post menopausal syndrome (05/24/2015), and Skin-picking disorder (05/24/2015).  PSH:    Past Surgical History:  Procedure Laterality Date   ABDOMINAL HYSTERECTOMY     COLONOSCOPY WITH PROPOFOL N/A 10/01/2015   Procedure: COLONOSCOPY WITH PROPOFOL;  Surgeon: Christena Deem, MD;  Location: [facility] ENDOSCOPY;  Service: Endoscopy;  Laterality: N/A;   ESOPHAGOGASTRODUODENOSCOPY (EGD) WITH PROPOFOL N/A 10/01/2015   Procedure: ESOPHAGOGASTRODUODENOSCOPY (EGD) WITH PROPOFOL;  Surgeon: Christena Deem, MD;  Location: [facility] ENDOSCOPY;  Service: Endoscopy;  Laterality: N/A;    Current Outpatient Medications  Medication Sig  Dispense Refill   albuterol (VENTOLIN HFA) 108 (90 Base) MCG/ACT inhaler Inhale 2 puffs into the lungs every 4 (four) hours as needed  for wheezing or shortness of breath. 18 g 1   aspirin EC 81 MG tablet Take 81 mg by mouth daily.     Calcium Carbonate-Vitamin D 600-400 MG-UNIT tablet Take 1 tablet by mouth daily.      fluticasone (FLONASE) 50 MCG/ACT nasal spray Place 2 sprays into both nostrils daily. 16 g 6   levocetirizine (XYZAL) 5 MG tablet Take 1 tablet (5 mg total) by mouth every evening. 90 tablet 1   losartan (COZAAR) 100 MG tablet TAKE 1 TABLET BY MOUTH DAILY 100 tablet 2   MULTIPLE VITAMIN PO Take 1 tablet by mouth daily.      nystatin (MYCOSTATIN/NYSTOP) powder Apply 1 Application topically 3 (three) times daily between meals as needed (to affected skin fold/rash as needed). 60 g 1   pantoprazole (PROTONIX) 40 MG tablet Take 1 tablet (40 mg total) by mouth 2 (two) times daily. 180 tablet 0   psyllium (METAMUCIL) 0.52 g capsule Take 0.52 g by mouth daily.     TRI-LUMA 0.01-4-0.05 % CREA SMARTSIG:1 Sparingly Topical Every Night     triamcinolone ointment (KENALOG) 0.5 % APPLY TOPICALLY TO AFFECTED  AREA(S) OF ECZEMA/RASHES TWICE  DAILY AS NEEDED . DO NOT APPLY  TO FACE 30 g 0   TURMERIC PO Take 1 tablet by mouth daily.      benzonatate (TESSALON) 100 MG capsule Take 1-2 capsules (100-200 mg total) by mouth 3 (three) times daily as needed for cough. (Patient not taking: Reported on 06/14/2023) 30 capsule 0   Blood Pressure Monitoring (ADULT BLOOD PRESSURE CUFF LG) KIT 1 each by Does not apply route daily. (Patient not taking: Reported on 09/06/2023) 1 kit 0   famotidine (PEPCID) 20 MG tablet Take 1 tablet (20 mg total) by mouth 2 (two) times daily as needed for heartburn or indigestion. (Patient not taking: Reported on 09/06/2023) 60 tablet 2   No current facility-administered medications for this visit.    Allergies:   Nitrofurantoin monohyd macro   Social History:  The patient  reports that she has never smoked. She has never used smokeless tobacco. She reports that she does not currently use alcohol. She reports that  she does not use drugs.   Family History:   family history includes Aneurysm in her mother; Congestive Heart Failure in her sister; Diabetes in her brother, mother, sister, and sister; Heart attack in her father; Heart disease in her brother, father, mother, sister, and sister; Pneumonia in her sister.    Review of Systems: Review of Systems  Constitutional: Negative.   HENT: Negative.    Respiratory: Negative.    Cardiovascular: Negative.   Gastrointestinal: Negative.   Musculoskeletal: Negative.   Neurological: Negative.   Psychiatric/Behavioral: Negative.    All other systems reviewed and are negative.   PHYSICAL EXAM: VS:  BP (!) 150/78 (BP Location: Left Arm, Patient Position: Sitting, Cuff Size: Large)   Pulse 68   Ht 5' 9.5" (1.765 m)   Wt 207 lb 4 oz (94 kg)   SpO2 97%   BMI 30.17 kg/m  , BMI Body mass index is 30.17 kg/m. Constitutional:  oriented to person, place, and time. No distress.  HENT:  Head: Grossly normal Eyes:  no discharge. No scleral icterus.  Neck: No JVD, no carotid bruits  Cardiovascular: Regular rate and rhythm, no murmurs appreciated Pulmonary/Chest: Clear to  auscultation bilaterally, no wheezes or rails Abdominal: Soft.  no distension.  no tenderness.  Musculoskeletal: Normal range of motion Neurological:  normal muscle tone. Coordination normal. No atrophy Skin: Skin warm and dry Psychiatric: normal affect, pleasant   Recent Labs: 01/28/2023: TSH 1.44 06/14/2023: ALT 10; BUN 16; Creat 0.96; Hemoglobin 12.8; Platelets 312; Potassium 4.4; Sodium 141    Lipid Panel Lab Results  Component Value Date   CHOL 146 06/14/2023   HDL 47 (L) 06/14/2023   LDLCALC 69 06/14/2023   TRIG 239 (H) 06/14/2023      Wt Readings from Last 3 Encounters:  09/06/23 207 lb 4 oz (94 kg)  06/14/23 216 lb 11.2 oz (98.3 kg)  05/24/23 217 lb (98.4 kg)     ASSESSMENT AND PLAN:  Other chronic pain Prior history of chest discomfort, felt to be  costochondritis No chest pain on exertion NSAIDs PRN  Hypertension goal BP (blood pressure)  Blood pressure elevated will recommend she change losartan to valsartan HCTZ 360/12.5 daily Recommend she send me blood pressure measurements in the next few weeks  Other fatigue Reports good energy, does motion low impact exercises Stopped playing tennis secondary to leg pain  Costochondritis In the past with elevated ANA/elevated CRP Previously seen by rheumatology at [facility] and in [place] No recent flareup  Borderline diabetes We have encouraged continued exercise, careful diet management in an effort to lose weight. Received Mounjaro, has not started   Total encounter time more than 30 minutes  Greater than 50% was spent in counseling and coordination of care with the patient    Orders Placed This Encounter  Procedures   EKG 12-Lead     Signed, Dossie Arbour, M.D., Ph.D. 09/06/2023  [facility] [place], [place] [phone]

## 2023-09-06 ENCOUNTER — Encounter: Payer: Self-pay | Admitting: Cardiovascular Disease

## 2023-09-06 ENCOUNTER — Ambulatory Visit: Payer: Medicare Other | Attending: Cardiovascular Disease | Admitting: Cardiovascular Disease

## 2023-09-06 VITALS — BP 150/78 | HR 68 | Ht 69.5 in | Wt 207.2 lb

## 2023-09-06 DIAGNOSIS — K219 Gastro-esophageal reflux disease without esophagitis: Secondary | ICD-10-CM

## 2023-09-06 DIAGNOSIS — I10 Essential (primary) hypertension: Secondary | ICD-10-CM | POA: Diagnosis not present

## 2023-09-06 DIAGNOSIS — M940 Chondrocostal junction syndrome [Tietze]: Secondary | ICD-10-CM | POA: Diagnosis not present

## 2023-09-06 DIAGNOSIS — R079 Chest pain, unspecified: Secondary | ICD-10-CM

## 2023-09-06 MED ORDER — VALSARTAN-HYDROCHLOROTHIAZIDE 320-12.5 MG PO TABS: 1.0000 | ORAL_TABLET | Freq: Every day | ORAL | 3 refills | Status: DC

## 2023-09-06 NOTE — Patient Instructions (Addendum)
Medication Instructions:  Please stop losartan Start valsartan hydrochlorothiazide 360/12.5 daily  Monitor blood pressure and call with numbers  If you need a refill on your cardiac medications before your next appointment, please call your pharmacy.   Lab work: No new labs needed  Testing/Procedures: No new testing needed  Follow-Up: At [facility], you and your health needs are our priority.  As part of our continuing mission to provide you with exceptional heart care, we have created designated Provider Care Teams.  These Care Teams include your primary Cardiologist (physician) and Advanced Practice Providers (APPs -  Physician Assistants and Nurse Practitioners) who all work together to provide you with the care you need, when you need it.  You will need a follow up appointment in 12 months  Providers on your designated Care Team:   Nicolasa Ducking, NP Eula Listen, PA-C Cadence Fransico Michael, [place]  COVID-19 Vaccine Information can be found at: [email or web address] For questions related to vaccine distribution or appointments, please email [email or web address] or call [phone].

## 2023-09-08 NOTE — Progress Notes (Unsigned)
   Acute Office Visit  Subjective:     Patient ID: Ruth Bishop, female    DOB: 1946-05-13, 77 y.o.   MRN: [id number]  No chief complaint on file.   HPI Patient is in today for vaginal atrophy and dryness.   ROS      Objective:    There were no vitals taken for this visit. {Vitals History (Optional):23777}  Physical Exam  No results found for any visits on 09/09/23.      Assessment & Plan:   Problem List Items Addressed This Visit   None   No orders of the defined types were placed in this encounter.   No follow-ups on file.  Margarita Mail, DO

## 2023-09-09 ENCOUNTER — Ambulatory Visit (INDEPENDENT_AMBULATORY_CARE_PROVIDER_SITE_OTHER): Payer: Medicare HMO | Admitting: Internal Medicine

## 2023-09-09 ENCOUNTER — Encounter: Payer: Self-pay | Admitting: Internal Medicine

## 2023-09-09 VITALS — BP 134/80 | HR 87 | Temp 98.0°F | Resp 18 | Ht 69.0 in | Wt 205.7 lb

## 2023-09-09 DIAGNOSIS — N898 Other specified noninflammatory disorders of vagina: Secondary | ICD-10-CM

## 2023-09-09 DIAGNOSIS — M1990 Unspecified osteoarthritis, unspecified site: Secondary | ICD-10-CM

## 2023-09-09 MED ORDER — ESTRADIOL 0.1 MG/GM VA CREA
TOPICAL_CREAM | VAGINAL | 12 refills | Status: DC
Start: 2023-09-09 — End: 2023-12-16

## 2023-09-15 ENCOUNTER — Telehealth: Payer: Self-pay | Admitting: Cardiovascular Disease

## 2023-09-15 NOTE — Telephone Encounter (Signed)
Patient called to report her BP readings as requested.  10/1 - 166/77  10/2 -  156/69 10/3 -  134/80 10/4 -  144/69 10/5 -  (not done) 10/6 -  156/75 10/7 -  129/73 10/8 -  (not done) 10/9 -  127/69

## 2023-09-15 NOTE — Telephone Encounter (Signed)
Will route to MD to review blood pressure readings as requested at last OV.  Thanks!

## 2023-09-16 NOTE — Telephone Encounter (Signed)
Called patient and notified her of the following from Dr. Mariah Milling.  Pressures look like they are coming down  Would continue to monitor them  Thx  TG   Patient verbalizes understanding.

## 2023-09-27 ENCOUNTER — Ambulatory Visit (INDEPENDENT_AMBULATORY_CARE_PROVIDER_SITE_OTHER): Payer: Medicare HMO | Admitting: Family Medicine

## 2023-09-27 ENCOUNTER — Telehealth: Payer: Self-pay | Admitting: Family Medicine

## 2023-09-27 ENCOUNTER — Encounter: Payer: Self-pay | Admitting: Family Medicine

## 2023-09-27 VITALS — BP 118/66 | HR 83 | Resp 16 | Ht 69.0 in | Wt 205.0 lb

## 2023-09-27 DIAGNOSIS — Z6830 Body mass index (BMI) 30.0-30.9, adult: Secondary | ICD-10-CM

## 2023-09-27 DIAGNOSIS — E66811 Obesity, class 1: Secondary | ICD-10-CM | POA: Diagnosis not present

## 2023-09-27 DIAGNOSIS — Z7689 Persons encountering health services in other specified circumstances: Secondary | ICD-10-CM

## 2023-09-27 MED ORDER — SEMAGLUTIDE-WEIGHT MANAGEMENT 0.5 MG/0.5ML ~~LOC~~ SOAJ
0.5000 mg | SUBCUTANEOUS | 2 refills | Status: DC
Start: 2023-10-26 — End: 2023-10-04

## 2023-09-27 NOTE — Telephone Encounter (Signed)
error 

## 2023-09-27 NOTE — Telephone Encounter (Deleted)
Pt is calling in requesting to speak with one of Dr. Karn Cassis nurses. Please follow up with pt.

## 2023-09-27 NOTE — Progress Notes (Unsigned)
Patient ID: Ruth Bishop, female    DOB: 1946/08/24, 77 y.o.   MRN: [id number]  PCP: Danelle Berry, PA-C  Chief Complaint  Patient presents with   Follow-up    Subjective:   Ruth Bishop is a 77 y.o. female, presents to clinic with CC of the following:  HPI    Here for refill on wegovy which she is paying cash for it doing 0.50 mg weekly, she done 3 doses Lost 16 lbs total  She states she can fill curbed appetite, but no Abd pain, N, V   Wt Readings from Last 10 Encounters:  09/27/23 205 lb (93 kg)  09/09/23 205 lb 11.2 oz (93.3 kg)  09/06/23 207 lb 4 oz (94 kg)  06/14/23 216 lb 11.2 oz (98.3 kg)  05/24/23 217 lb (98.4 kg)  05/19/23 216 lb 8 oz (98.2 kg)  03/01/23 213 lb 12.8 oz (97 kg)  02/08/23 212 lb 8 oz (96.4 kg)  01/28/23 213 lb (96.6 kg)  12/22/22 208 lb (94.3 kg)   BMI Readings from Last 5 Encounters:  09/27/23 30.27 kg/m  09/09/23 30.38 kg/m  09/06/23 30.17 kg/m  06/14/23 32.00 kg/m  05/24/23 32.05 kg/m     Patient Active Problem List   Diagnosis Date Noted   Hypertriglyceridemia 03/20/2022   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   Class 1 obesity with body mass index (BMI) of 32.0 to 32.9 in adult 03/20/2022   Lung nodules 03/26/2020   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   Elevated C-reactive protein (CRP) 02/14/2018   Elevated antinuclear antibody (ANA) level 02/14/2018   First degree AV block 09/17/2017   Left atrial enlargement 09/17/2017   Aortic regurgitation 06/17/2016   Mitral regurgitation 06/17/2016   Diastolic dysfunction 06/17/2016   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   Gastroesophageal reflux disease 05/24/2015   Primary hypertension 05/24/2015   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      Current Outpatient Medications:    albuterol (VENTOLIN HFA) 108 (90 Base) MCG/ACT inhaler, Inhale 2 puffs into the lungs every 4 (four) hours as needed for wheezing or shortness of breath.,  Disp: 18 g, Rfl: 1   aspirin EC 81 MG tablet, Take 81 mg by mouth daily., Disp: , Rfl:    Calcium Carbonate-Vitamin D 600-400 MG-UNIT tablet, Take 1 tablet by mouth daily. , Disp: , Rfl:    estradiol (ESTRACE VAGINAL) 0.1 MG/GM vaginal cream, Insert 0.5 g of cream intravaginally, administered daily for 2 weeks, then reduce to twice weekly., Disp: 42.5 g, Rfl: 12   fluticasone (FLONASE) 50 MCG/ACT nasal spray, Place 2 sprays into both nostrils daily., Disp: 16 g, Rfl: 6   levocetirizine (XYZAL) 5 MG tablet, Take 1 tablet (5 mg total) by mouth every evening., Disp: 90 tablet, Rfl: 1   MULTIPLE VITAMIN PO, Take 1 tablet by mouth daily. , Disp: , Rfl:    nystatin (MYCOSTATIN/NYSTOP) powder, Apply 1 Application topically 3 (three) times daily between meals as needed (to affected skin fold/rash as needed)., Disp: 60 g, Rfl: 1   pantoprazole (PROTONIX) 40 MG tablet, Take 1 tablet (40 mg total) by mouth 2 (two) times daily., Disp: 180 tablet, Rfl: 0   psyllium (METAMUCIL) 0.52 g capsule, Take 0.52 g by mouth daily., Disp: , Rfl:    Semaglutide-Weight Management (WEGOVY) 0.25 MG/0.5ML SOAJ, Inject 0.25 mg into the skin., Disp: , Rfl:    TRI-LUMA 0.01-4-0.05 % CREA, SMARTSIG:1 Sparingly Topical Every Night, Disp: , Rfl:    triamcinolone  ointment (KENALOG) 0.5 %, APPLY TOPICALLY TO AFFECTED  AREA(S) OF ECZEMA/RASHES TWICE  DAILY AS NEEDED . DO NOT APPLY  TO FACE, Disp: 30 g, Rfl: 0   TURMERIC PO, Take 1 tablet by mouth daily. , Disp: , Rfl:    valsartan-hydrochlorothiazide (DIOVAN-HCT) 320-12.5 MG tablet, Take 1 tablet by mouth daily., Disp: 90 tablet, Rfl: 3   Allergies  Allergen Reactions   Nitrofurantoin Monohyd Macro      Social History   Tobacco Use   Smoking status: Never   Smokeless tobacco: Never   Tobacco comments:    smoking cessation materials not required  Vaping Use   Vaping status: Never Used  Substance Use Topics   Alcohol use: Not Currently    Alcohol/week: 0.0 standard drinks  of alcohol   Drug use: No      Chart Review Today:   Review of Systems     Objective:   Vitals:   09/27/23 1138  BP: 118/66  Pulse: 83  Resp: 16  SpO2: 96%  Weight: 205 lb (93 kg)  Height: 5\' 9"  (1.753 m)    Body mass index is 30.27 kg/m.  Physical Exam   Results for orders placed or performed in visit on 06/14/23  COMPLETE METABOLIC PANEL WITH GFR  Result Value Ref Range   Glucose, Bld 99 65 - 139 mg/dL   BUN 16 7 - 25 mg/dL   Creat 1.61 0.96 - 0.45 mg/dL   eGFR 62 > OR = 60 WU/JWJ/1.91Y7   BUN/Creatinine Ratio SEE NOTE: 6 - 22 (calc)   Sodium 141 135 - 146 mmol/L   Potassium 4.4 3.5 - 5.3 mmol/L   Chloride 106 98 - 110 mmol/L   CO2 30 20 - 32 mmol/L   Calcium 9.6 8.6 - 10.4 mg/dL   Total Protein 7.0 6.1 - 8.1 g/dL   Albumin 3.9 3.6 - 5.1 g/dL   Globulin 3.1 1.9 - 3.7 g/dL (calc)   AG Ratio 1.3 1.0 - 2.5 (calc)   Total Bilirubin 0.4 0.2 - 1.2 mg/dL   Alkaline phosphatase (APISO) 90 37 - 153 U/L   AST 13 10 - 35 U/L   ALT 10 6 - 29 U/L  Hemoglobin A1c  Result Value Ref Range   Hgb A1c MFr Bld 6.0 (H) <5.7 % of total Hgb   Mean Plasma Glucose 126 mg/dL   eAG (mmol/L) 7.0 mmol/L  Lipid panel  Result Value Ref Range   Cholesterol 146 <200 mg/dL   HDL 47 (L) > OR = 50 mg/dL   Triglycerides 829 (H) <150 mg/dL   LDL Cholesterol (Calc) 69 mg/dL (calc)   Total CHOL/HDL Ratio 3.1 <5.0 (calc)   Non-HDL Cholesterol (Calc) 99 <562 mg/dL (calc)  CBC with Differential/Platelet  Result Value Ref Range   WBC 6.5 3.8 - 10.8 Thousand/uL   RBC 4.95 3.80 - 5.10 Million/uL   Hemoglobin 12.8 11.7 - 15.5 g/dL   HCT 13.0 86.5 - 78.4 %   MCV 80.6 80.0 - 100.0 fL   MCH 25.9 (L) 27.0 - 33.0 pg   MCHC 32.1 32.0 - 36.0 g/dL   RDW 69.6 29.5 - 28.4 %   Platelets 312 140 - 400 Thousand/uL   MPV 11.1 7.5 - 12.5 fL   Neutro Abs 2,893 1,500 - 7,800 cells/uL   Lymphs Abs 2,873 850 - 3,900 cells/uL   Absolute Monocytes 462 200 - 950 cells/uL   Eosinophils Absolute 234 15 - 500  cells/uL   Basophils Absolute 39  0 - 200 cells/uL   Neutrophils Relative % 44.5 %   Total Lymphocyte 44.2 %   Monocytes Relative 7.1 %   Eosinophils Relative 3.6 %   Basophils Relative 0.6 %       Assessment & Plan:   Class 1 obesity with body mass index (BMI) of 30.0 to 30.9 in adult, unspecified obesity type, unspecified whether serious comorbidity present - Plan: Semaglutide-Weight Management 0.5 MG/0.5ML SOAJ  Encounter for weight management - Plan: Semaglutide-Weight Management 0.5 MG/0.5ML SOAJ   F/up in 2-3 months for recheck  She will want to increase the dose in a few months      Danelle Berry, PA-C 09/27/23 11:53 AM

## 2023-10-04 ENCOUNTER — Telehealth: Payer: Self-pay | Admitting: Family Medicine

## 2023-10-04 DIAGNOSIS — Z7689 Persons encountering health services in other specified circumstances: Secondary | ICD-10-CM

## 2023-10-04 DIAGNOSIS — E66811 Body mass index (BMI) 30.0-30.9, adult: Secondary | ICD-10-CM

## 2023-10-04 MED ORDER — ZEPBOUND 2.5 MG/0.5ML ~~LOC~~ SOAJ
2.5000 mg | SUBCUTANEOUS | 0 refills | Status: DC
Start: 2023-10-04 — End: 2023-11-22

## 2023-10-04 MED ORDER — ZEPBOUND 5 MG/0.5ML ~~LOC~~ SOAJ
5.0000 mg | SUBCUTANEOUS | 0 refills | Status: DC
Start: 2023-11-01 — End: 2023-11-22

## 2023-10-04 NOTE — Addendum Note (Signed)
Addended by: Danelle Berry on: 10/04/2023 03:35 PM   Modules accepted: Orders

## 2023-10-04 NOTE — Telephone Encounter (Signed)
Pt is calling in because pt says there is a Scientist, clinical (histocompatibility and immunogenetics) on Semaglutide-Weight Management 0.5 MG/0.5ML SOAJ and she wants to know if there is something else she can be prescribed. Pt says she has already taken her last shot. Please advise. Pt is requesting a call back.

## 2023-10-04 NOTE — Telephone Encounter (Signed)
I called pt and verbalized understanding. She requested for me to send her MyChart message which I already did and she will let us know if she agrees to start on that.

## 2023-10-04 NOTE — Telephone Encounter (Signed)
Pt is calling to report that she is in agreement to try the zepbound and would like it sent to  [facility] [address], [place] - [address] Phone: [phone]  Fax: [phone]    Pt has contacted pharmacy as the medication is in stock.

## 2023-10-04 NOTE — Telephone Encounter (Signed)
Would you prescribe any alternatives?

## 2023-10-05 DIAGNOSIS — Z0100 Encounter for examination of eyes and vision without abnormal findings: Secondary | ICD-10-CM | POA: Diagnosis not present

## 2023-10-05 NOTE — Telephone Encounter (Signed)
Pt called back again.  Saying she is being told it would pay and telling you it will not pay.  She is going to call [facility] and talk to a supervisor.

## 2023-10-13 ENCOUNTER — Ambulatory Visit
Admission: RE | Admit: 2023-10-13 | Discharge: 2023-10-13 | Disposition: A | Discharge status: Home / Self Care | Payer: Medicare HMO | Source: Ambulatory Visit | Attending: Family Medicine | Admitting: Family Medicine

## 2023-10-13 DIAGNOSIS — Z1231 Encounter for screening mammogram for malignant neoplasm of breast: Secondary | ICD-10-CM | POA: Diagnosis not present

## 2023-11-16 ENCOUNTER — Ambulatory Visit: Payer: Medicare PPO

## 2023-11-18 ENCOUNTER — Other Ambulatory Visit: Payer: Self-pay | Admitting: Family Medicine

## 2023-11-18 DIAGNOSIS — R140 Abdominal distension (gaseous): Secondary | ICD-10-CM

## 2023-11-18 DIAGNOSIS — R630 Anorexia: Secondary | ICD-10-CM

## 2023-11-18 DIAGNOSIS — R1906 Epigastric swelling, mass or lump: Secondary | ICD-10-CM

## 2023-11-23 ENCOUNTER — Encounter: Payer: Self-pay | Admitting: Physician Assistant

## 2023-11-23 ENCOUNTER — Ambulatory Visit (INDEPENDENT_AMBULATORY_CARE_PROVIDER_SITE_OTHER): Payer: Medicare Other | Admitting: Physician Assistant

## 2023-11-23 VITALS — BP 118/74 | HR 94 | Resp 16 | Ht 69.0 in

## 2023-11-23 DIAGNOSIS — Z0000 Encounter for general adult medical examination without abnormal findings: Secondary | ICD-10-CM | POA: Diagnosis not present

## 2023-11-23 DIAGNOSIS — E781 Pure hyperglyceridemia: Secondary | ICD-10-CM

## 2023-11-23 DIAGNOSIS — R7303 Prediabetes: Secondary | ICD-10-CM

## 2023-11-23 DIAGNOSIS — Z1329 Encounter for screening for other suspected endocrine disorder: Secondary | ICD-10-CM | POA: Diagnosis not present

## 2023-11-23 DIAGNOSIS — I10 Essential (primary) hypertension: Secondary | ICD-10-CM

## 2023-11-23 DIAGNOSIS — Z7189 Other specified counseling: Secondary | ICD-10-CM

## 2023-11-23 HISTORY — DX: Other specified counseling: Z71.89

## 2023-11-23 NOTE — Progress Notes (Signed)
Annual Physical Exam   Name: Ruth Bishop   MRN: [id number]    DOB: 01/11/1946   Date:11/23/2023  Today's Provider: Jacquelin Hawking, MHS, PA-C Introduced myself to the patient as a PA-C and provided education on APPs in clinical practice.         Subjective  Chief Complaint  Chief Complaint  Patient presents with   Annual Exam    HPI  Patient presents for annual CPE.  Diet: She feels like her diet is well balanced with proteins, carbs, fats, vegetables  Exercise:  She is walking 2 miles every other day and does stretches along with calisthenics   Sleep:" good" getting about 7 hours per night, feels well rested in the AM  Mood:" Good"   Flowsheet Row Office Visit from 11/23/2023 in [facility]  AUDIT-C Score 1       Depression: Phq 9 is  negative    11/23/2023    1:39 PM 09/27/2023   11:39 AM 09/09/2023    3:41 PM 06/14/2023    1:49 PM 05/24/2023    2:05 PM  Depression screen PHQ 2/9  Decreased Interest 0 0 0 0 1  Down, Depressed, Hopeless 0 0 0 0 1  PHQ - 2 Score 0 0 0 0 2  Altered sleeping 0 0 0 0 1  Tired, decreased energy 0 0 0 0 1  Change in appetite 0 0 0 0 0  Feeling bad or failure about yourself  0 0 0 0 0  Trouble concentrating 0 0 0 0 0  Moving slowly or fidgety/restless 0 0 0 0 0  Suicidal thoughts 0 0 0 0 0  PHQ-9 Score 0 0 0 0 4  Difficult doing work/chores Not difficult at all  Not difficult at all Not difficult at all Somewhat difficult   Hypertension: BP Readings from Last 3 Encounters:  11/23/23 118/74  09/27/23 118/66  09/09/23 134/80   Obesity: Wt Readings from Last 3 Encounters:  09/27/23 205 lb (93 kg)  09/09/23 205 lb 11.2 oz (93.3 kg)  09/06/23 207 lb 4 oz (94 kg)   BMI Readings from Last 3 Encounters:  11/23/23 30.27 kg/m  09/27/23 30.27 kg/m  09/09/23 30.38 kg/m     Vaccines:  Health Maintenance  Topic Date Due   Medicare Annual Wellness Visit  01/02/2024   Mammogram  10/12/2024    DTaP/Tdap/Td vaccine (2 - Td or Tdap) 05/23/2025   Colon Cancer Screening  09/30/2025   Pneumonia Vaccine  Completed   Flu Shot  Completed   DEXA scan (bone density measurement)  Completed   COVID-19 Vaccine  Completed   Hepatitis C Screening  Completed   Zoster (Shingles) Vaccine  Completed   HPV Vaccine  Aged Out    STD testing and prevention (HIV/chl/gon/syphilis): She declines this today  Intimate partner violence: negative Sexual History : She is not sexually active  Menstrual History/LMP/Abnormal Bleeding: LMP was many years ago- she has had hysterectomy   Discussed importance of follow up if any post-menopausal bleeding: not applicable Incontinence Symptoms: No.  Breast cancer hx:  - Last Mammogram: 10/13/23- normal findings  - BRCA gene screening:   Osteoporosis Prevention : Discussed high calcium and vitamin D supplementation, weight bearing exercises Bone density :no, most recently completed 01/01/23- normal findings   Cervical cancer screening: Unsure if she sill has cervix - hx of hysterectomy   Skin cancer: Discussed monitoring for atypical lesions  Colorectal cancer  screening: NA   Lung cancer:  Low Dose CT Chest recommended if Age 9-80 years, 20 pack-year currently smoking OR have quit w/in 15years. Patient does not qualify.   ECG: NA  Advanced Care Planning: A voluntary discussion about advance care planning including the explanation and discussion of advance directives.  Discussed health care proxy and Living will, and the patient was able to identify a health care proxy as her daughter, Orbie Hurst .  Patient does have a living will in effect.  Lipids: Lab Results  Component Value Date   CHOL 146 06/14/2023   CHOL 150 03/20/2022   CHOL 160 04/03/2021   Lab Results  Component Value Date   HDL 47 (L) 06/14/2023   HDL 59 03/20/2022   HDL 55 04/03/2021   Lab Results  Component Value Date   LDLCALC 69 06/14/2023   LDLCALC 71 03/20/2022   LDLCALC  80 04/03/2021   Lab Results  Component Value Date   TRIG 239 (H) 06/14/2023   TRIG 114 03/20/2022   TRIG 149 04/03/2021   Lab Results  Component Value Date   CHOLHDL 3.1 06/14/2023   CHOLHDL 2.5 03/20/2022   CHOLHDL 2.9 04/03/2021   No results found for: "LDLDIRECT"  Glucose: Glucose, Bld  Date Value Ref Range Status  06/14/2023 99 65 - 139 mg/dL Final    Comment:    .        Non-fasting reference interval .   01/28/2023 84 65 - 99 mg/dL Final    Comment:    .            Fasting reference interval .   09/16/2022 86 65 - 99 mg/dL Final    Comment:    .            Fasting reference interval .     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   Elevated C-reactive protein (CRP) 02/14/2018   Elevated antinuclear antibody (ANA) level 02/14/2018   First degree AV block 09/17/2017   Left atrial enlargement 09/17/2017   Aortic regurgitation 06/17/2016   Mitral regurgitation 06/17/2016   Diastolic dysfunction 06/17/2016   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   Gastroesophageal reflux disease 05/24/2015   Primary hypertension 05/24/2015   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    Past Surgical History:  Procedure Laterality Date   ABDOMINAL HYSTERECTOMY     COLONOSCOPY WITH PROPOFOL N/A 10/01/2015   Procedure: COLONOSCOPY WITH PROPOFOL;  Surgeon: Christena Deem, MD;  Location: [facility] ENDOSCOPY;  Service: Endoscopy;  Laterality: N/A;   ESOPHAGOGASTRODUODENOSCOPY (EGD) WITH PROPOFOL N/A 10/01/2015   Procedure: ESOPHAGOGASTRODUODENOSCOPY (EGD) WITH PROPOFOL;  Surgeon: Christena Deem, MD;  Location: [facility] ENDOSCOPY;  Service: Endoscopy;   Laterality: N/A;    Family History  Problem Relation Age of Onset   Heart disease Mother    Diabetes Mother    Aneurysm Mother    Heart disease Father    Heart attack Father    Heart disease Sister    Diabetes Sister    Pneumonia Sister    Heart disease Brother    Diabetes Brother    Diabetes Sister    Heart disease Sister    Congestive Heart  Failure Sister    Breast cancer Neg Hx     Social History   Socioeconomic History   Marital status: Widowed    Spouse name: Not on file   Number of children: 1   Years of education: Not on file   Highest education level: Master's degree (e.g., MA, MS, MEng, MEd, MSW, MBA)  Occupational History   Occupation: Retired  Tobacco Use   Smoking status: Never   Smokeless tobacco: Never   Tobacco comments:    smoking cessation materials not required  Vaping Use   Vaping status: Never Used  Substance and Sexual Activity   Alcohol use: Not Currently    Alcohol/week: 0.0 standard drinks of alcohol   Drug use: No   Sexual activity: Not Currently  Other Topics Concern   Not on file  Social History Narrative   Pt lives alone   Social Drivers of Health   Financial Resource Strain: Low Risk  (11/19/2023)   Overall Financial Resource Strain (CARDIA)    Difficulty of Paying Living Expenses: Not hard at all  Food Insecurity: No Food Insecurity (11/23/2023)   Hunger Vital Sign    Worried About Running Out of Food in the Last Year: Never true    Ran Out of Food in the Last Year: Never true  Transportation Needs: No Transportation Needs (11/23/2023)   PRAPARE - Administrator, Civil Service (Medical): No    Lack of Transportation (Non-Medical): No  Physical Activity: Insufficiently Active (11/19/2023)   Exercise Vital Sign    Days of Exercise per Week: 3 days    Minutes of Exercise per Session: 40 min  Stress: No Stress Concern Present (11/19/2023)   [employer] of Occupational Health - Occupational Stress  Questionnaire    Feeling of Stress : Not at all  Social Connections: Moderately Integrated (11/19/2023)   Social Connection and Isolation Panel [NHANES]    Frequency of Communication with Friends and Family: More than three times a week    Frequency of Social Gatherings with Friends and Family: Twice a week    Attends Religious Services: More than 4 times per year    Active Member of [employer] or Organizations: Yes    Attends Banker Meetings: More than 4 times per year    Marital Status: Widowed  Intimate Partner Violence: Not At Risk (11/23/2023)   Humiliation, Afraid, Rape, and Kick questionnaire    Fear of Current or Ex-Partner: No    Emotionally Abused: No    Physically Abused: No    Sexually Abused: No     Current Outpatient Medications:    albuterol (VENTOLIN HFA) 108 (90 Base) MCG/ACT inhaler, Inhale 2 puffs into the lungs every 4 (four) hours as needed for wheezing or shortness of breath., Disp: 18 g, Rfl: 1   aspirin EC 81 MG tablet, Take 81 mg by mouth daily., Disp: , Rfl:    Calcium Carbonate-Vitamin D 600-400 MG-UNIT tablet, Take 1 tablet by mouth daily. , Disp: , Rfl:    estradiol (ESTRACE VAGINAL) 0.1 MG/GM vaginal cream, Insert 0.5 g of cream intravaginally, administered daily for 2 weeks, then reduce to twice weekly., Disp: 42.5 g, Rfl: 12   fluticasone (FLONASE) 50 MCG/ACT nasal spray, Place 2 sprays into both nostrils daily., Disp: 16 g, Rfl: 6   levocetirizine (XYZAL) 5 MG tablet, Take 1 tablet (5 mg total) by mouth every evening., Disp: 90 tablet, Rfl: 1   MULTIPLE VITAMIN PO, Take 1 tablet by mouth  daily. , Disp: , Rfl:    nystatin (MYCOSTATIN/NYSTOP) powder, Apply 1 Application topically 3 (three) times daily between meals as needed (to affected skin fold/rash as needed)., Disp: 60 g, Rfl: 1   pantoprazole (PROTONIX) 40 MG tablet, Take 1 tablet by mouth twice daily, Disp: 180 tablet, Rfl: 0   psyllium (METAMUCIL) 0.52 g capsule, Take 0.52 g by mouth  daily., Disp: , Rfl:    tirzepatide (ZEPBOUND) 10 MG/0.5ML Pen, Inject 10 mg into the skin once a week., Disp: , Rfl:    TRI-LUMA 0.01-4-0.05 % CREA, SMARTSIG:1 Sparingly Topical Every Night, Disp: , Rfl:    triamcinolone ointment (KENALOG) 0.5 %, APPLY TOPICALLY TO AFFECTED  AREA(S) OF ECZEMA/RASHES TWICE  DAILY AS NEEDED . DO NOT APPLY  TO FACE, Disp: 30 g, Rfl: 0   TURMERIC PO, Take 1 tablet by mouth daily. , Disp: , Rfl:    valsartan-hydrochlorothiazide (DIOVAN-HCT) 320-12.5 MG tablet, Take 1 tablet by mouth daily., Disp: 90 tablet, Rfl: 3  Allergies  Allergen Reactions   Nitrofurantoin Monohyd Macro      Review of Systems  Constitutional:  Negative for chills, fever, malaise/fatigue and weight loss.  HENT:  Positive for hearing loss. Negative for nosebleeds, sore throat and tinnitus.   Eyes:  Negative for blurred vision, double vision and photophobia.  Respiratory:  Negative for cough, shortness of breath and wheezing.   Cardiovascular:  Negative for chest pain, palpitations and leg swelling.  Gastrointestinal:  Negative for blood in stool, constipation, diarrhea, heartburn, nausea and vomiting.  Genitourinary:  Negative for dysuria and frequency.  Musculoskeletal:  Negative for falls, joint pain and myalgias.  Skin:  Negative for itching and rash.  Neurological:  Negative for dizziness, tingling, tremors, loss of consciousness, weakness and headaches.  Psychiatric/Behavioral:  Negative for depression and memory loss. The patient is not nervous/anxious and does not have insomnia.       Objective  Vitals:   11/23/23 1340  BP: 118/74  Pulse: 94  Resp: 16  SpO2: 98%  Height: 5\' 9"  (1.753 m)    Body mass index is 30.27 kg/m.  Physical Exam Vitals reviewed.  Constitutional:      General: She is awake.     Appearance: Normal appearance. She is well-developed and well-groomed.  HENT:     Head: Normocephalic and atraumatic.     Right Ear: Hearing, tympanic membrane, ear  canal and external ear normal.     Left Ear: Hearing, tympanic membrane, ear canal and external ear normal.     Nose: Nose normal.     Mouth/Throat:     Lips: Pink.     Mouth: Mucous membranes are moist.     Pharynx: Oropharynx is clear. No oropharyngeal exudate or posterior oropharyngeal erythema.  Eyes:     General: Lids are normal. Gaze aligned appropriately.     Extraocular Movements: Extraocular movements intact.     Conjunctiva/sclera: Conjunctivae normal.     Pupils: Pupils are equal, round, and reactive to light.  Neck:     Thyroid: No thyroid mass, thyromegaly or thyroid tenderness.  Cardiovascular:     Rate and Rhythm: Normal rate and regular rhythm.     Pulses: Normal pulses.          Radial pulses are 2+ on the right side and 2+ on the left side.     Heart sounds: Normal heart sounds. No murmur heard.    No friction rub. No gallop.  Pulmonary:     Effort:  Pulmonary effort is normal.     Breath sounds: Normal breath sounds. No decreased air movement. No decreased breath sounds, wheezing, rhonchi or rales.  Abdominal:     General: Abdomen is flat. Bowel sounds are normal.     Palpations: Abdomen is soft.     Tenderness: There is no abdominal tenderness.  Musculoskeletal:        General: Normal range of motion.     Cervical back: Normal range of motion and neck supple. No pain with movement.     Right lower leg: No edema.     Left lower leg: No edema.  Lymphadenopathy:     Head:     Right side of head: No submental, submandibular or preauricular adenopathy.     Left side of head: No submental, submandibular or preauricular adenopathy.     Cervical:     Right cervical: No superficial or posterior cervical adenopathy.    Left cervical: No superficial or posterior cervical adenopathy.     Upper Body:     Right upper body: No supraclavicular adenopathy.     Left upper body: No supraclavicular adenopathy.  Skin:    General: Skin is warm and dry.     Capillary Refill:  Capillary refill takes less than 2 seconds.  Neurological:     General: No focal deficit present.     Mental Status: She is alert and oriented to person, place, and time.     GCS: GCS eye subscore is 4. GCS verbal subscore is 5. GCS motor subscore is 6.     Cranial Nerves: No cranial nerve deficit, dysarthria or facial asymmetry.     Motor: No weakness, tremor, atrophy or abnormal muscle tone.     Gait: Gait is intact.     Deep Tendon Reflexes:     Reflex Scores:      Patellar reflexes are 1+ on the right side and 1+ on the left side. Psychiatric:        Attention and Perception: Attention and perception normal.        Mood and Affect: Mood and affect normal.        Speech: Speech normal.        Behavior: Behavior normal. Behavior is cooperative.        Thought Content: Thought content normal.        Cognition and Memory: Cognition and memory normal.        Judgment: Judgment normal.      No results found for this or any previous visit (from the past 2160 hours).   Fall Risk:    11/23/2023    1:39 PM 09/27/2023   11:38 AM 09/09/2023    3:39 PM 06/14/2023    1:48 PM 05/24/2023    2:05 PM  Fall Risk   Falls in the past year? 0 0 0 0 0  Number falls in past yr: 0 0 0 0 0  Injury with Fall? 0 0 0 0 0  Risk for fall due to : No Fall Risks No Fall Risks  No Fall Risks No Fall Risks  Follow up Falls prevention discussed Falls prevention discussed  Falls prevention discussed;Education provided;Falls evaluation completed Falls prevention discussed;Education provided;Falls evaluation completed     Functional Status Survey:     Assessment & Plan  Problem List Items Addressed This Visit       Other   Hypertriglyceridemia   Relevant Orders   Lipid panel   Advanced care planning/counseling discussion  A voluntary discussion about advance care planning including the explanation and discussion of advance directives was extensively discussed  with the patient for 3  minutes with  patient and myself  present.   Discussed health care proxy and Living will, and the patient was able to identify a health care proxy as her daughter, Orbie Hurst .  Patient does have a living will in effect and does not wish to make updates today        Other Visit Diagnoses       Well adult exam    -  Primary   Relevant Orders   TSH   Hemoglobin A1c   Lipid panel   CBC with Differential/Platelet   COMPLETE METABOLIC PANEL WITH GFR     Prediabetes       Relevant Orders   Hemoglobin A1c     Screening for thyroid disorder       Relevant Orders   TSH      -USPSTF grade A and B recommendations reviewed with patient; age-appropriate recommendations, preventive care, screening tests, etc discussed and encouraged; healthy living encouraged; see AVS for patient education given to patient -Discussed importance of 150 minutes of physical activity weekly, eat two servings of fish weekly, eat one serving of tree nuts ( cashews, pistachios, pecans, almonds.Marland Kitchen) every other day, eat 6 servings of fruit/vegetables daily and drink plenty of water and avoid sweet beverages.   -Reviewed Health Maintenance: Yes.    Return in about 6 months (around 05/23/2024) for HLD, HTN, prediabetes.   I, Marayah Higdon E Leshay Desaulniers, PA-C, have reviewed all documentation for this visit. The documentation on 11/23/23 for the exam, diagnosis, procedures, and orders are all accurate and complete.   Jacquelin Hawking, MHS, PA-C [facility]

## 2023-11-23 NOTE — Assessment & Plan Note (Signed)
A voluntary discussion about advance care planning including the explanation and discussion of advance directives was extensively discussed  with the patient for 3  minutes with patient and myself  present.   Discussed health care proxy and Living will, and the patient was able to identify a health care proxy as her daughter, Orbie Hurst .  Patient does have a living will in effect and does not wish to make updates today

## 2023-11-24 LAB — COMPLETE METABOLIC PANEL WITH GFR
AG Ratio: 1.2 (calc) (ref 1.0–2.5)
ALT: 11 U/L (ref 6–29)
AST: 15 U/L (ref 10–35)
Albumin: 3.8 g/dL (ref 3.6–5.1)
Alkaline phosphatase (APISO): 85 U/L (ref 37–153)
BUN: 15 mg/dL (ref 7–25)
CO2: 29 mmol/L (ref 20–32)
Calcium: 9.7 mg/dL (ref 8.6–10.4)
Chloride: 102 mmol/L (ref 98–110)
Creat: 0.94 mg/dL (ref 0.60–1.00)
Globulin: 3.2 g/dL (ref 1.9–3.7)
Glucose, Bld: 88 mg/dL (ref 65–99)
Potassium: 4.2 mmol/L (ref 3.5–5.3)
Sodium: 139 mmol/L (ref 135–146)
Total Bilirubin: 0.5 mg/dL (ref 0.2–1.2)
Total Protein: 7 g/dL (ref 6.1–8.1)
eGFR: 63 mL/min/{1.73_m2} (ref >=60)

## 2023-11-24 LAB — CBC WITH DIFFERENTIAL/PLATELET
Absolute Lymphocytes: 2701 {cells}/uL (ref 850–3900)
Absolute Monocytes: 416 {cells}/uL (ref 200–950)
Basophils Absolute: 38 {cells}/uL (ref 0–200)
Basophils Relative: 0.6 %
Eosinophils Absolute: 128 {cells}/uL (ref 15–500)
Eosinophils Relative: 2 %
HCT: 39.9 % (ref 35.0–45.0)
Hemoglobin: 12.3 g/dL (ref 11.7–15.5)
MCH: 25.7 pg — ABNORMAL LOW (ref 27.0–33.0)
MCHC: 30.8 g/dL — ABNORMAL LOW (ref 32.0–36.0)
MCV: 83.5 fL (ref 80.0–100.0)
MPV: 10.7 fL (ref 7.5–12.5)
Monocytes Relative: 6.5 %
Neutro Abs: 3117 {cells}/uL (ref 1500–7800)
Neutrophils Relative %: 48.7 %
Platelets: 341 10*3/uL (ref 140–400)
RBC: 4.78 10*6/uL (ref 3.80–5.10)
RDW: 14.4 % (ref 11.0–15.0)
Total Lymphocyte: 42.2 %
WBC: 6.4 10*3/uL (ref 3.8–10.8)

## 2023-11-24 LAB — LIPID PANEL
Cholesterol: 149 mg/dL (ref <200)
HDL: 52 mg/dL (ref >=50)
LDL Cholesterol (Calc): 77 mg/dL
Non-HDL Cholesterol (Calc): 97 mg/dL (ref <130)
Total CHOL/HDL Ratio: 2.9 (calc) (ref <5.0)
Triglycerides: 116 mg/dL (ref <150)

## 2023-11-24 LAB — HEMOGLOBIN A1C
Hgb A1c MFr Bld: 5.6 %{Hb} (ref <5.7)
Mean Plasma Glucose: 114 mg/dL
eAG (mmol/L): 6.3 mmol/L

## 2023-11-24 LAB — TSH: TSH: 1.09 m[IU]/L (ref 0.40–4.50)

## 2023-11-26 NOTE — Progress Notes (Signed)
Your labs are back Your CBC is overall normal, no signs of anemia.   Your thyroid testing was normal Your A1c has improved to 5.6%.  This is back in the normal range.  Well-done Your cholesterol looks great. Your electrolytes, liver and kidney function are overall in normal ranges and appear stable. Please continue current medication regimen as directed.  Please let us know if you have further questions or concerns

## 2023-12-13 ENCOUNTER — Ambulatory Visit: Payer: Medicare Other | Admitting: Family Medicine

## 2023-12-16 ENCOUNTER — Ambulatory Visit: Payer: Medicare Other

## 2023-12-16 ENCOUNTER — Other Ambulatory Visit: Payer: Self-pay | Admitting: Family Medicine

## 2023-12-16 DIAGNOSIS — J329 Chronic sinusitis, unspecified: Secondary | ICD-10-CM

## 2023-12-16 DIAGNOSIS — N898 Other specified noninflammatory disorders of vagina: Secondary | ICD-10-CM

## 2023-12-16 DIAGNOSIS — R630 Anorexia: Secondary | ICD-10-CM

## 2023-12-16 DIAGNOSIS — Z0000 Encounter for general adult medical examination without abnormal findings: Secondary | ICD-10-CM | POA: Diagnosis not present

## 2023-12-16 DIAGNOSIS — H9201 Otalgia, right ear: Secondary | ICD-10-CM

## 2023-12-16 DIAGNOSIS — L309 Dermatitis, unspecified: Secondary | ICD-10-CM

## 2023-12-16 DIAGNOSIS — R140 Abdominal distension (gaseous): Secondary | ICD-10-CM

## 2023-12-16 DIAGNOSIS — B372 Candidiasis of skin and nail: Secondary | ICD-10-CM

## 2023-12-16 DIAGNOSIS — R1906 Epigastric swelling, mass or lump: Secondary | ICD-10-CM

## 2023-12-16 NOTE — Progress Notes (Signed)
 Subjective:   Ruth Bishop is a 78 y.o. female who presents for Medicare Annual (Subsequent) preventive examination.  Visit Complete: Virtual I connected with  Ruth Bishop on 12/16/23 by a audio enabled telemedicine application and verified that I am speaking with the correct person using two identifiers.  This patient declined Interactive audio and acupuncturist. Therefore the visit was completed with audio only.   Patient Location: Home  Provider Location: Office/Clinic  I discussed the limitations of evaluation and management by telemedicine. The patient expressed understanding and agreed to proceed.  Vital Signs: Because this visit was a virtual/telehealth visit, some criteria may be missing or patient reported. Any vitals not documented were not able to be obtained and vitals that have been documented are patient reported.   Cardiac Risk Factors include: advanced age (>50men, >56 women);obesity (BMI >30kg/m2);hypertension     Objective:    There were no vitals filed for this visit. There is no height or weight on file to calculate BMI.     12/16/2023    2:01 PM 10/14/2021    1:44 PM 08/20/2020    9:56 AM 08/17/2019   10:40 AM 06/28/2019   10:59 AM 08/05/2018   12:59 PM 08/04/2017    2:09 PM  Advanced Directives  Does Patient Have a Medical Advance Directive? No Yes Yes Yes No Yes Yes  Type of Best Boy of [place];Living will Healthcare Power of [place];Living will  Healthcare Power of [place];Living will   Copy of Healthcare Power of Attorney in Chart?   No - copy requested No - copy requested  No - copy requested   Would patient like information on creating a medical advance directive? No - Patient declined    No - Patient declined      Current Medications (verified) Outpatient Encounter Medications as of 12/16/2023  Medication Sig   albuterol  (VENTOLIN  HFA) 108 (90 Base) MCG/ACT inhaler Inhale 2 puffs into the lungs every 4  (four) hours as needed for wheezing or shortness of breath.   aspirin EC 81 MG tablet Take 81 mg by mouth daily.   Calcium Carbonate-Vitamin D 600-400 MG-UNIT tablet Take 1 tablet by mouth daily.    fluticasone  (FLONASE ) 50 MCG/ACT nasal spray Place 2 sprays into both nostrils daily.   MULTIPLE VITAMIN PO Take 1 tablet by mouth daily.    nystatin  (MYCOSTATIN /NYSTOP ) powder Apply 1 Application topically 3 (three) times daily between meals as needed (to affected skin fold/rash as needed).   pantoprazole  (PROTONIX ) 40 MG tablet Take 1 tablet by mouth twice daily   psyllium (METAMUCIL) 0.52 g capsule Take 0.52 g by mouth daily.   tirzepatide  (ZEPBOUND ) 10 MG/0.5ML Pen Inject 10 mg into the skin once a week.   TRI-LUMA 0.01-4-0.05 % CREA SMARTSIG:1 Sparingly Topical Every Night   triamcinolone  ointment (KENALOG ) 0.5 % APPLY TOPICALLY TO AFFECTED  AREA(S) OF ECZEMA/RASHES TWICE  DAILY AS NEEDED . DO NOT APPLY  TO FACE   TURMERIC PO Take 1 tablet by mouth daily.    valsartan -hydrochlorothiazide  (DIOVAN -HCT) 320-12.5 MG tablet Take 1 tablet by mouth daily.   estradiol  (ESTRACE  VAGINAL) 0.1 MG/GM vaginal cream Insert 0.5 g of cream intravaginally, administered daily for 2 weeks, then reduce to twice weekly. (Patient not taking: Reported on 12/16/2023)   levocetirizine (XYZAL ) 5 MG tablet Take 1 tablet (5 mg total) by mouth every evening. (Patient not taking: Reported on 12/16/2023)   No facility-administered encounter medications on file as of 12/16/2023.  Allergies (verified) Nitrofurantoin monohyd macro   History: Past Medical History:  Diagnosis Date   Abnormal chest x-ray 10/18/2018   Advanced care planning/counseling discussion 11/23/2023   Arthritis    Chronic pain 05/24/2015   Diastolic dysfunction 06/17/2016   GERD (gastroesophageal reflux disease)    Heart murmur    Hormone replacement therapy 05/26/2016   Hypertension    Hypertriglyceridemia 03/20/2022   Incidental lung nodule, > 3mm and  < 8mm 08/04/2017   4 mm ground glass nodule LLL incidentally noted on CT scan Nov 2016   Osteoarthritis of both knees 06/13/2015   [facility]    Post menopausal syndrome 05/24/2015   Skin-picking disorder 05/24/2015   when nervous/stressed chews on fingers and peels down skin, started with cuticles but now hypopigmentation of fingers down to palm in some areas with lifetime of skin picking.    Past Surgical History:  Procedure Laterality Date   ABDOMINAL HYSTERECTOMY     COLONOSCOPY WITH PROPOFOL  N/A 10/01/2015   Procedure: COLONOSCOPY WITH PROPOFOL ;  Surgeon: Gladis RAYMOND Mariner, MD;  Location: [facility] ENDOSCOPY;  Service: Endoscopy;  Laterality: N/A;   ESOPHAGOGASTRODUODENOSCOPY (EGD) WITH PROPOFOL  N/A 10/01/2015   Procedure: ESOPHAGOGASTRODUODENOSCOPY (EGD) WITH PROPOFOL ;  Surgeon: Gladis RAYMOND Mariner, MD;  Location: [facility] ENDOSCOPY;  Service: Endoscopy;  Laterality: N/A;   Family History  Problem Relation Age of Onset   Heart disease Mother    Diabetes Mother    Aneurysm Mother    Heart disease Father    Heart attack Father    Heart disease Sister    Diabetes Sister    Pneumonia Sister    Heart disease Brother    Diabetes Brother    Diabetes Sister    Heart disease Sister    Congestive Heart Failure Sister    Breast cancer Neg Hx    Social History   Socioeconomic History   Marital status: Widowed    Spouse name: Not on file   Number of children: 1   Years of education: Not on file   Highest education level: Master's degree (e.g., MA, MS, MEng, MEd, MSW, MBA)  Occupational History   Occupation: Retired  Tobacco Use   Smoking status: Never   Smokeless tobacco: Never   Tobacco comments:    smoking cessation materials not required  Vaping Use   Vaping status: Never Used  Substance and Sexual Activity   Alcohol use: Not Currently    Alcohol/week: 0.0 standard drinks of alcohol   Drug use: No   Sexual activity: Not Currently  Other Topics Concern   Not on file  Social  History Narrative   Pt lives alone   Social Drivers of Health   Financial Resource Strain: Low Risk  (12/16/2023)   Overall Financial Resource Strain (CARDIA)    Difficulty of Paying Living Expenses: Not hard at all  Food Insecurity: No Food Insecurity (12/16/2023)   Hunger Vital Sign    Worried About Running Out of Food in the Last Year: Never true    Ran Out of Food in the Last Year: Never true  Transportation Needs: No Transportation Needs (12/16/2023)   PRAPARE - Administrator, Civil Service (Medical): No    Lack of Transportation (Non-Medical): No  Physical Activity: Insufficiently Active (12/16/2023)   Exercise Vital Sign    Days of Exercise per Week: 3 days    Minutes of Exercise per Session: 40 min  Stress: No Stress Concern Present (12/16/2023)   [employer] of Occupational Health -  Occupational Stress Questionnaire    Feeling of Stress : Not at all  Social Connections: Moderately Integrated (12/16/2023)   Social Connection and Isolation Panel [NHANES]    Frequency of Communication with Friends and Family: More than three times a week    Frequency of Social Gatherings with Friends and Family: Twice a week    Attends Religious Services: More than 4 times per year    Active Member of [employer] or Organizations: Yes    Attends Banker Meetings: More than 4 times per year    Marital Status: Widowed    Tobacco Counseling Counseling given: Not Answered Tobacco comments: smoking cessation materials not required   Clinical Intake:  Pre-visit preparation completed: Yes  Pain : No/denies pain     BMI - recorded: 30.3 Nutritional Status: BMI > 30  Obese Nutritional Risks: None Diabetes: No  How often do you need to have someone help you when you read instructions, pamphlets, or other written materials from your doctor or pharmacy?: 1 - Never  Interpreter Needed?: No  Information entered by :: JHONNIE DAS, LPN   Activities of Daily Living     12/16/2023    2:03 PM 09/27/2023   11:39 AM  In your present state of health, do you have any difficulty performing the following activities:  Hearing? 0 1  Vision? 0 0  Difficulty concentrating or making decisions? 0 0  Walking or climbing stairs? 0 1  Dressing or bathing? 0 0  Doing errands, shopping? 0 0  Preparing Food and eating ? N   Using the Toilet? N   In the past six months, have you accidently leaked urine? N   Do you have problems with loss of bowel control? N   Managing your Medications? N   Managing your Finances? N   Housekeeping or managing your Housekeeping? N     Patient Care Team: Tapia, Leisa, PA-C as PCP - General (Family Medicine) Perla Evalene PARAS, MD as PCP - Cardiology (Cardiology) Aryal, Govinda, MD as Consulting Physician (Rheumatology) Perla Evalene PARAS, MD as Consulting Physician (Cardiology)  Indicate any recent Medical Services you may have received from other than [facility] providers in the past year (date may be approximate).     Assessment:   This is a routine wellness examination for [place].  Hearing/Vision screen Hearing Screening - Comments:: NO AIDS Vision Screening - Comments:: WEARS GLASSES-   Goals Addressed             This Visit's Progress    DIET - EAT MORE FRUITS AND VEGETABLES         Depression Screen    12/16/2023    1:59 PM 11/23/2023    1:39 PM 09/27/2023   11:39 AM 09/09/2023    3:41 PM 06/14/2023    1:49 PM 05/24/2023    2:05 PM 03/01/2023   11:02 AM  PHQ 2/9 Scores  PHQ - 2 Score 0 0 0 0 0 2 0  PHQ- 9 Score 0 0 0 0 0 4 0    Fall Risk    12/16/2023    2:03 PM 11/23/2023    1:39 PM 09/27/2023   11:38 AM 09/09/2023    3:39 PM 06/14/2023    1:48 PM  Fall Risk   Falls in the past year? 0 0 0 0 0  Number falls in past yr: 0 0 0 0 0  Injury with Fall? 0 0 0 0 0  Risk for fall due to :  No Fall Risks No Fall Risks No Fall Risks  No Fall Risks  Follow up Falls prevention discussed;Falls evaluation completed Falls  prevention discussed Falls prevention discussed  Falls prevention discussed;Education provided;Falls evaluation completed    MEDICARE RISK AT HOME: Medicare Risk at Home Any stairs in or around the home?: Yes If so, are there any without handrails?: No Home free of loose throw rugs in walkways, pet beds, electrical cords, etc?: Yes Adequate lighting in your home to reduce risk of falls?: Yes Life alert?: No Use of a cane, walker or w/c?: No Grab bars in the bathroom?: No Shower chair or bench in shower?: No Elevated toilet seat or a handicapped toilet?: No  TIMED UP AND GO:  Was the test performed?  No    Cognitive Function:        12/16/2023    2:04 PM 11/10/2022   11:00 AM 08/17/2019   10:46 AM 08/05/2018    1:00 PM  6CIT Screen  What Year? 0 points 0 points 0 points 0 points  What month? 0 points 0 points 0 points 0 points  What time? 0 points 0 points 0 points 0 points  Count back from 20 0 points 0 points 0 points 0 points  Months in reverse 0 points 0 points 0 points 0 points  Repeat phrase 0 points 0 points 0 points 4 points  Total Score 0 points 0 points 0 points 4 points    Immunizations Immunization History  Administered Date(s) Administered   Fluad Quad(high Dose 65+) 08/17/2019, 08/20/2020   Influenza, High Dose Seasonal PF 08/04/2017, 08/05/2018, 08/10/2023   Influenza, Seasonal, Injecte, Preservative Fre 10/10/2014   Influenza-Unspecified 10/23/2015, 10/06/2016, 08/21/2022   PFIZER(Purple Top)SARS-COV-2 Vaccination 01/29/2020, 02/19/2020, 09/07/2020, 08/26/2021, 09/07/2022   PNEUMOCOCCAL CONJUGATE-20 08/10/2023   Pfizer Covid-19 Vaccine Bivalent Booster 33yrs & up 09/07/2022   Pfizer(Comirnaty)Fall Seasonal Vaccine 12 years and older 08/10/2023   Pneumococcal Conjugate-13 10/10/2014   Pneumococcal Polysaccharide-23 05/24/2015, 10/23/2015   Respiratory Syncytial Virus Vaccine,Recomb Aduvanted(Arexvy) 08/21/2022, 08/25/2023   Tdap 05/24/2015   Zoster  Recombinant(Shingrix) 08/04/2019, 10/07/2019    TDAP status: Up to date  Flu Vaccine status: Up to date  Pneumococcal vaccine status: Up to date  Covid-19 vaccine status: Completed vaccines  Qualifies for Shingles Vaccine? Yes   Zostavax completed No   Shingrix Completed?: Yes  Screening Tests Health Maintenance  Topic Date Due   MAMMOGRAM  10/12/2024   Medicare Annual Wellness (AWV)  12/15/2024   DTaP/Tdap/Td (2 - Td or Tdap) 05/23/2025   Colonoscopy  09/30/2025   Pneumonia Vaccine 44+ Years old  Completed   INFLUENZA VACCINE  Completed   DEXA SCAN  Completed   COVID-19 Vaccine  Completed   Hepatitis C Screening  Completed   Zoster Vaccines- Shingrix  Completed   HPV VACCINES  Aged Out    Health Maintenance  There are no preventive care reminders to display for this patient.   Colorectal cancer screening: Type of screening: Colonoscopy. Completed 10/01/15. Repeat every 10 years  Mammogram status: Completed 10/13/23. Repeat every year  Bone Density status: Completed 01/01/23. Results reflect: Bone density results: NORMAL. Repeat every 5 years.  Lung Cancer Screening: (Low Dose CT Chest recommended if Age 49-80 years, 20 pack-year currently smoking OR have quit w/in 15years.) does not qualify.    Additional Screening:  Hepatitis C Screening: does qualify; Completed 06/13/15  Vision Screening: Recommended annual ophthalmology exams for early detection of glaucoma and other disorders of the eye. Is the patient  up to date with their annual eye exam?  Yes  Who is the provider or what is the name of the office in which the patient attends annual eye exams? DR.MITON  If pt is not established with a provider, would they like to be referred to a provider to establish care? No .   Dental Screening: Recommended annual dental exams for proper oral hygiene   Community Resource Referral / Chronic Care Management: CRR required this visit?  No   CCM required this visit?   No     Plan:     I have personally reviewed and noted the following in the patient's chart:   Medical and social history Use of alcohol, tobacco or illicit drugs  Current medications and supplements including opioid prescriptions. Patient is not currently taking opioid prescriptions. Functional ability and status Nutritional status Physical activity Advanced directives List of other physicians Hospitalizations, surgeries, and ER visits in previous 12 months Vitals Screenings to include cognitive, depression, and falls Referrals and appointments  In addition, I have reviewed and discussed with patient certain preventive protocols, quality metrics, and best practice recommendations. A written personalized care plan for preventive services as well as general preventive health recommendations were provided to patient.     Jhonnie GORMAN Das, LPN   8/0/7974   After Visit Summary: (MyChart) Due to this being a telephonic visit, the after visit summary with patients personalized plan was offered to patient via MyChart   Nurse Notes: NONE

## 2023-12-16 NOTE — Telephone Encounter (Addendum)
 Patient called to go over the medications to send to [facility]. She had her list and named the one's she wanted refilled and they are attached to this encounter. Advised valsartan -hydrochlorothiazide  prescribed by Dr. Gollan so she will need to reach out to his office for the refill. She verbalized understanding.

## 2023-12-16 NOTE — Patient Instructions (Addendum)
 Ms. [name] , Thank you for taking time to come for your Medicare Wellness Visit. I appreciate your ongoing commitment to your health goals. Please review the following plan we discussed and let me know if I can assist you in the future.   Referrals/Orders/Follow-Ups/Clinician Recommendations: NONE  This is a list of the screening recommended for you and due dates:  Health Maintenance  Topic Date Due   Mammogram  10/12/2024   Medicare Annual Wellness Visit  12/15/2024   DTaP/Tdap/Td vaccine (2 - Td or Tdap) 05/23/2025   Colon Cancer Screening  09/30/2025   Pneumonia Vaccine  Completed   Flu Shot  Completed   DEXA scan (bone density measurement)  Completed   COVID-19 Vaccine  Completed   Hepatitis C Screening  Completed   Zoster (Shingles) Vaccine  Completed   HPV Vaccine  Aged Out    Advanced directives: (ACP Link)Information on Advanced Care Planning can be found at [place]  Secretary of [facility] Advance Health Care Directives Advance Health Care Directives ([email or web address]   Next Medicare Annual Wellness Visit scheduled for next year: Yes   12/28/24 @ 1:50 PM BY VIDEO

## 2023-12-16 NOTE — Telephone Encounter (Signed)
 Patient called stated all her maintenance meds need to be sent to  [facility] [place], [place] - [address] Phone: [phone]  Fax: [phone]

## 2023-12-17 ENCOUNTER — Telehealth: Payer: Self-pay | Admitting: Cardiovascular Disease

## 2023-12-17 MED ORDER — VALSARTAN-HYDROCHLOROTHIAZIDE 320-12.5 MG PO TABS: 1.0000 | ORAL_TABLET | Freq: Every day | ORAL | 3 refills | Status: DC

## 2023-12-17 NOTE — Telephone Encounter (Signed)
*  STAT* If patient is at the pharmacy, call can be transferred to refill team.   1. Which medications need to be refilled? (please list name of each medication and dose if known)   valsartan -hydrochlorothiazide  (DIOVAN -HCT) 320-12.5 MG tablet    2. Which pharmacy/location (including street and city if local pharmacy) is medication to be sent to?  [facility] - [place], [place] - [address]      3. Do they need a 30 day or 90 day supply? 90 day

## 2023-12-17 NOTE — Telephone Encounter (Signed)
 Requested Prescriptions   Signed Prescriptions Disp Refills   valsartan-hydrochlorothiazide (DIOVAN-HCT) 320-12.5 MG tablet 90 tablet 3    Sig: Take 1 tablet by mouth daily.    Authorizing Provider: Antonieta Iba    Ordering User: Kendrick Fries

## 2023-12-20 MED ORDER — ESTRADIOL 0.1 MG/GM VA CREA: TOPICAL_CREAM | VAGINAL | 9 refills | Status: DC

## 2023-12-20 MED ORDER — FLUTICASONE PROPIONATE 50 MCG/ACT NA SUSP: 2.0000 | Freq: Every day | NASAL | 5 refills | Status: AC

## 2023-12-20 MED ORDER — LEVOCETIRIZINE DIHYDROCHLORIDE 5 MG PO TABS: 5.0000 mg | ORAL_TABLET | Freq: Every evening | ORAL | 1 refills | Status: AC

## 2023-12-20 MED ORDER — PANTOPRAZOLE SODIUM 40 MG PO TBEC: 40.0000 mg | DELAYED_RELEASE_TABLET | Freq: Two times a day (BID) | ORAL | 0 refills | Status: DC

## 2023-12-20 NOTE — Telephone Encounter (Signed)
 Requested medication (s) are due for refill today: yes  Requested medication (s) are on the active medication list: yes  Last refill:  triamcinolone : 30 g 04/22/23                            Nystatin : 12/14/22 60 grams 1 RF  Future visit scheduled: yes  Notes to clinic:  triamcinolone : not delegated to NT to RF Nystatin : med not assigned to a protocol   Requested Prescriptions  Pending Prescriptions Disp Refills   triamcinolone  ointment (KENALOG ) 0.5 % 30 g 0     Not Delegated - Dermatology:  Corticosteroids Failed - 12/20/2023 12:24 PM      Failed - This refill cannot be delegated      Passed - Valid encounter within last 12 months    Recent Outpatient Visits           3 weeks ago Well adult exam   [facility] Mecum, Erin E, PA-C   2 months ago Class 1 obesity with body mass index (BMI) of 30.0 to 30.9 in adult, unspecified obesity type, unspecified whether serious comorbidity present   [facility] Leavy Mole, PA-C   3 months ago Vaginal dryness   [facility] Bernardo Fend, DO   6 months ago Primary hypertension   [facility] Leavy Mole, PA-C   7 months ago Upper respiratory tract infection, unspecified type   [facility] Leavy Mole, PA-C       Future Appointments             In 5 months Leavy Mole, PA-C [facility]             nystatin  (MYCOSTATIN /NYSTOP ) powder 60 g     Sig: Apply 1 Application topically 3 (three) times daily between meals as needed (to affected skin fold/rash as needed).     Off-Protocol Failed - 12/20/2023 12:24 PM      Failed - Medication not assigned to a protocol, review manually.      Passed - Valid encounter within last 12 months    Recent Outpatient Visits           3 weeks ago Well adult exam   [place] [facility] Mecum, Erin E, PA-C   2  months ago Class 1 obesity with body mass index (BMI) of 30.0 to 30.9 in adult, unspecified obesity type, unspecified whether serious comorbidity present   [facility] Le[facility] Mole, PA-C   3 months ago Vaginal dryness   [facility] Bernardo Fend, DO   6 months ago Primary hypertension   [place] [facility] Leavy Mole, PA-C   7 months ago Upper respiratory tract infection, unspecified type   [facility] Leavy Mole, PA-C       Future Appointments             In 5 months Tapia, Leisa, PA-C [place] [address]            Signed Prescriptions Disp Refills   estradiol  (ESTRACE  VAGINAL) 0.1 MG/GM vaginal cream 42.5 g 9    Sig: Insert 0.5 g of cream intravaginally, administered daily for 2 weeks, then reduce to twice weekly.     OB/GYN:  Estrogens  Passed - 12/20/2023 12:24 PM  Passed - Mammogram is up-to-date per Health Maintenance      Passed - Last BP in normal range    BP Readings from Last 1 Encounters:  11/23/23 118/74         Passed - Valid encounter within last 12 months    Recent Outpatient Visits           3 weeks ago Well adult exam   [place] [facility] Mecum, Erin E, PA-C   2 months ago Class 1 obesity with body mass index (BMI) of 30.0 to 30.9 in adult, unspecified obesity type, unspecified whether serious comorbidity present   [facility] Leavy Mole, PA-C   3 months ago Vaginal dryness   [facility] Bernardo Fend, DO   6 months ago Primary hypertension   [facility] Leavy Mole, PA-C   7 months ago Upper respiratory tract infection, unspecified type   [facility] Leavy Mole, PA-C       Future Appointments             In 5 months Leavy Mole, PA-C [place] [facility]             pantoprazole  (PROTONIX ) 40 MG tablet 180 tablet 0    Sig: Take 1 tablet (40 mg total) by mouth 2 (two) times daily.     Gastroenterology: Proton Pump Inhibitors Passed - 12/20/2023 12:24 PM      Passed - Valid encounter within last 12 months    Recent Outpatient Visits           3 weeks ago Well adult exam   [place] [facility] Mecum, Erin E, PA-C   2 months ago Class 1 obesity with body mass index (BMI) of 30.0 to 30.9 in adult, unspecified obesity type, unspecified whether serious comorbidity present   [facility] Leavy Mole, PA-C   3 months ago Vaginal dryness   [facility] Bernardo Fend, DO   6 months ago Primary hypertension   [place] [facility] Leavy Mole, PA-C   7 months ago Upper respiratory tract infection, unspecified type   [facility] Le[facility] Mole, PA-C       Future Appointments             In 5 months Leavy Mole, PA-C [place] [facility]             fluticasone  (FLONASE ) 50 MCG/ACT nasal spray 16 g 5    Sig: Place 2 sprays into both nostrils daily.     Ear, Nose, and Throat: Nasal Preparations - Corticosteroids Passed - 12/20/2023 12:24 PM      Passed - Valid encounter within last 12 months    Recent Outpatient Visits           3 weeks ago Well adult exam   [place] [facility] Mecum, Erin E, PA-C   2 months ago Class 1 obesity with body mass index (BMI) of 30.0 to 30.9 in adult, unspecified obesity type, unspecified whether serious comorbidity present   [facility] Le[facility] Mole, PA-C   3 months ago Vaginal dryness   [facility] Bernardo Fend, DO   6 months ago Primary hypertension   [place] [facility] Leavy Mole, PA-C   7 months ago Upper respiratory tract infection,  unspecified type    [facility] [place]ole, [place]       Future Appointments             In 5 months Tapia, Leisa, PA-C [place]             levocetirizine (XYZAL ) 5 MG tablet 90 tablet 1    Sig: Take 1 tablet (5 mg total) by mouth every evening.     Ear, Nose, and Throat:  Antihistamines - levocetirizine dihydrochloride  Passed - 12/20/2023 12:24 PM      Passed - Cr in normal range and within 360 days    Creat  Date Value Ref Range Status  11/23/2023 0.94 0.60 - 1.00 mg/dL Final         Passed - eGFR is 10 or above and within 360 days    GFR, Est African American  Date Value Ref Range Status  04/03/2021 89 > OR = 60 mL/min/1.74m2 Final   GFR, Est Non African American  Date Value Ref Range Status  04/03/2021 77 > OR = 60 mL/min/1.34m2 Final   eGFR  Date Value Ref Range Status  11/23/2023 63 > OR = 60 mL/min/1.66m2 Final         Passed - Valid encounter within last 12 months    Recent Outpatient Visits           3 weeks ago Well adult exam   [facility] Mecum, Erin E, PA-C   2 months ago Class 1 obesity with body mass index (BMI) of 30.0 to 30.9 in adult, unspecified obesity type, unspecified whether serious comorbidity present   [facility] Leavy Mole, PA-C   3 months ago Vaginal dryness   [facility] Bernardo Fend, DO   6 months ago Primary hypertension   [place] [facility] Leavy Mole, PA-C   7 months ago Upper respiratory tract infection, unspecified type   [facility] Leavy Mole, PA-C       Future Appointments             In 5 months Leavy Mole, PA-C [facility], [facility]

## 2023-12-20 NOTE — Telephone Encounter (Signed)
 Requested Prescriptions  Pending Prescriptions Disp Refills   estradiol  (ESTRACE  VAGINAL) 0.1 MG/GM vaginal cream 42.5 g 9    Sig: Insert 0.5 g of cream intravaginally, administered daily for 2 weeks, then reduce to twice weekly.     OB/GYN:  Estrogens  Passed - 12/20/2023 12:23 PM      Passed - Mammogram is up-to-date per Health Maintenance      Passed - Last BP in normal range    BP Readings from Last 1 Encounters:  11/23/23 118/74         Passed - Valid encounter within last 12 months    Recent Outpatient Visits           3 weeks ago Well adult exam   [facility] Mecum, Erin E, PA-C   2 months ago Class 1 obesity with body mass index (BMI) of 30.0 to 30.9 in adult, unspecified obesity type, unspecified whether serious comorbidity present   [facility] Le[facility]ole, [facility]-C   3 months ago Vaginal dryness   [facility] Bernardo Fend, [place]   6 months ago Primary hypertension   [place] [facility] Leavy Mole, PA-C   7 months ago Upper respiratory tract infection, unspecified type   [facility] Leavy Mole, PA-C       Future Appointments             In 5 months Leavy Mole, PA-C [place] [facility]             pantoprazole  (PROTONIX ) 40 MG tablet 180 tablet 0    Sig: Take 1 tablet (40 mg total) by mouth 2 (two) times daily.     Gastroenterology: Proton Pump Inhibitors Passed - 12/20/2023 12:23 PM      Passed - Valid encounter within last 12 months    Recent Outpatient Visits           3 weeks ago Well adult exam   [place] [facility] Mecum, Erin E, PA-C   2 months ago Class 1 obesity with body mass index (BMI) of 30.0 to 30.9 in adult, unspecified obesity type, unspecified whether serious comorbidity present   [facility] Leavy Mole, [facility]-C   3 months ago Vaginal dryness    [facility] Bernardo Fend, DO   6 months ago Primary hypertension   [facility] Leavy Mole, PA-C   7 months ago Upper respiratory tract infection, unspecified type   [facility] Leavy Mole, PA-C       Future Appointments             In 5 months Leavy Mole, PA-C [place] [facility]             triamcinolone  ointment (KENALOG ) 0.5 % 30 g 0     Not Delegated - Dermatology:  Corticosteroids Failed - 12/20/2023 12:23 PM      Failed - This refill cannot be delegated      Passed - Valid encounter within last 12 months    Recent Outpatient Visits           3 weeks ago Well adult exam   [place] [facility] Mecum, Erin E, PA-C   2 months ago Class 1 obesity with body mass index (BMI) of 30.0 to 30.9 in adult, unspecified obesity type, unspecified whether serious comorbidity  present   [facility] Leavy [place]ole, [place]   3 months ago Vaginal dryness   [facility] Bernardo Fend, DO   6 months ago Primary hypertension   [place] [facility] Leavy Mole, PA-C   7 months ago Upper respiratory tract infection, unspecified type   [facility] Leavy Mole, PA-C       Future Appointments             In 5 months Tapia, Leisa, PA-C [place] [facility]             fluticasone  (FLONASE ) 50 MCG/ACT nasal spray 16 g 5    Sig: Place 2 sprays into both nostrils daily.     Ear, Nose, and Throat: Nasal Preparations - Corticosteroids Passed - 12/20/2023 12:23 PM      Passed - Valid encounter within last 12 months    Recent Outpatient Visits           3 weeks ago Well adult exam   [facility] Mecum, Erin E, PA-C   2 months ago Class 1 obesity with body mass index (BMI) of 30.0 to 30.9 in adult,  unspecified obesity type, unspecified whether serious comorbidity present   [facility], [facility]-C   3 months ago Vaginal dryness   [facility] Bernardo Fend, DO   6 months ago Primary hypertension   [place] [facility] Leavy Mole, PA-C   7 months ago Upper respiratory tract infection, unspecified type   [facility] Leavy Mole, PA-C       Future Appointments             In 5 months Leavy Mole, PA-C [facility]             levocetirizine (XYZAL ) 5 MG tablet 90 tablet 1    Sig: Take 1 tablet (5 mg total) by mouth every evening.     Ear, Nose, and Throat:  Antihistamines - levocetirizine dihydrochloride  Passed - 12/20/2023 12:23 PM      Passed - Cr in normal range and within 360 days    Creat  Date Value Ref Range Status  11/23/2023 0.94 0.60 - 1.00 mg/dL Final         Passed - eGFR is 10 or above and within 360 days    GFR, Est African American  Date Value Ref Range Status  04/03/2021 89 > OR = 60 mL/min/1.48m2 Final   GFR, Est Non African American  Date Value Ref Range Status  04/03/2021 77 > OR = 60 mL/min/1.15m2 Final   eGFR  Date Value Ref Range Status  11/23/2023 63 > OR = 60 mL/min/1.74m2 Final         Passed - Valid encounter within last 12 months    Recent Outpatient Visits           3 weeks ago Well adult exam   [place] [facility] Mecum, Erin E, PA-C   2 months ago Class 1 obesity with body mass index (BMI) of 30.0 to 30.9 in adult, unspecified obesity type, unspecified whether serious comorbidity present   [facility] Le[facility], [facility]   3 months ago Vaginal dryness   [facility] Bernardo Fend, [place]   6 months ago Primary hypertension   [place] [facility] Leavy Mole, PA-C   7 months ago Upper respiratory  tract infection, unspecified type   [facility] Leavy [place]ole, [place]       Future Appointments             In 5 months Tapia, Leisa, PA-C [place] [facility]             nystatin  (MYCOSTATIN /NYSTOP ) powder 60 g     Sig: Apply 1 Application topically 3 (three) times daily between meals as needed (to affected skin fold/rash as needed).     Off-Protocol Failed - 12/20/2023 12:23 PM      Failed - Medication not assigned to a protocol, review manually.      Passed - Valid encounter within last 12 months    Recent Outpatient Visits           3 weeks ago Well adult exam   [place] [facility] Mecum, Erin E, PA-C   2 months ago Class 1 obesity with body mass index (BMI) of 30.0 to 30.9 in adult, unspecified obesity type, unspecified whether serious comorbidity present   [facility] Leavy Mole, PA-C   3 months ago Vaginal dryness   [facility] Bernardo Fend, [place]   6 months ago Primary hypertension   [facility] Leavy Mole, PA-C   7 months ago Upper respiratory tract infection, unspecified type   [facility] Le[facility] Mole, [facility]-C       Future Appointments             In 5 months Leavy Mole, PA-C [facility]

## 2023-12-23 MED ORDER — TRIAMCINOLONE ACETONIDE 0.5 % EX OINT: TOPICAL_OINTMENT | CUTANEOUS | 0 refills | Status: DC

## 2023-12-23 MED ORDER — NYSTATIN 100000 UNIT/GM EX POWD: 1.0000 | Freq: Three times a day (TID) | CUTANEOUS | 0 refills | Status: DC | PRN

## 2024-01-31 ENCOUNTER — Encounter: Payer: Self-pay | Admitting: Family Medicine

## 2024-02-01 ENCOUNTER — Other Ambulatory Visit: Payer: Self-pay | Admitting: Family Medicine

## 2024-02-01 ENCOUNTER — Telehealth: Payer: Self-pay | Admitting: Family Medicine

## 2024-02-01 NOTE — Telephone Encounter (Signed)
 Copied from CRM [id number]. Topic: Clinical - Medication Refill >> Feb 01, 2024  8:38 AM Bobbye Morton wrote: Most Recent Primary Care Visit:  Provider: Hal Hope  Department: [facility]  Visit Type: MEDICARE WELL VISIT  Date: 12/16/2023  Medication: Ketoconazole shampoo 2% Clobetsol propianate topical solution,USP scalp application   Has the patient contacted their pharmacy? Yes (Agent: If no, request that the patient contact the pharmacy for the refill. If patient does not wish to contact the pharmacy document the reason why and proceed with request.) (Agent: If yes, when and what did the pharmacy advise?)  Is this the correct pharmacy for this prescription? Yes If no, delete pharmacy and type the correct one.  This is the patient's preferred pharmacy:  [facility] [place] - [address] [place] [address] Phone: [phone] Fax: [phone]   Has the prescription been filled recently? No  Is the patient out of the medication? Yes  Has the patient been seen for an appointment in the last year OR does the patient have an upcoming appointment? Yes  Can we respond through MyChart? Yes  Agent: Please be advised that Rx refills may take up to 3 business days. We ask that you follow-up with your pharmacy.

## 2024-02-02 ENCOUNTER — Encounter: Payer: Self-pay | Admitting: Family Medicine

## 2024-02-02 ENCOUNTER — Ambulatory Visit (INDEPENDENT_AMBULATORY_CARE_PROVIDER_SITE_OTHER): Payer: Medicare Other | Admitting: Family Medicine

## 2024-02-02 VITALS — BP 134/78 | HR 87 | Resp 16 | Ht 69.0 in | Wt 192.0 lb

## 2024-02-02 DIAGNOSIS — J683 Other acute and subacute respiratory conditions due to chemicals, gases, fumes and vapors: Secondary | ICD-10-CM | POA: Diagnosis not present

## 2024-02-02 DIAGNOSIS — L659 Nonscarring hair loss, unspecified: Secondary | ICD-10-CM

## 2024-02-02 DIAGNOSIS — L219 Seborrheic dermatitis, unspecified: Secondary | ICD-10-CM

## 2024-02-02 MED ORDER — CLOBETASOL PROPIONATE 0.05 % EX SOLN: 1.0000 | Freq: Two times a day (BID) | CUTANEOUS | 1 refills | Status: DC | PRN

## 2024-02-02 MED ORDER — KETOCONAZOLE 2 % EX SHAM: 1.0000 | MEDICATED_SHAMPOO | CUTANEOUS | 5 refills | Status: AC

## 2024-02-02 NOTE — Telephone Encounter (Signed)
 Requested medication (s) are due for refill today: No  Requested medication (s) are on the active medication list: Yes  Last refill:  02/02/24  Future visit scheduled:   Notes to clinic:  Not delegated, different pharmacy.    Requested Prescriptions  Pending Prescriptions Disp Refills   triamcinolone cream (KENALOG) 0.1 % 30 g 0    Sig: Apply 1 Application topically 2 (two) times daily.     Not Delegated - Dermatology:  Corticosteroids Failed - 02/02/2024  9:50 AM      Failed - This refill cannot be delegated      Passed - Valid encounter within last 12 months    Recent Outpatient Visits           2 months ago Well adult exam   [facility] Mecum, Erin E, PA-C   4 months ago Class 1 obesity with body mass index (BMI) of 30.0 to 30.9 in adult, unspecified obesity type, unspecified whether serious comorbidity present   [place] Danelle Berry, PA-C   4 months ago Vaginal dryness   [facility] Margarita Mail, [place]   7 months ago Primary hypertension   [facility] Danelle Berry, PA-C   8 months ago Upper respiratory tract infection, unspecified type   [facility] Dan[facility] Berry, [facility]-C       Future Appointments             In 3 months Danelle Berry, PA-C [facility]             clobetasol (TEMOVATE) 0.05 % external solution 50 mL 0    Sig: Apply 1 Application topically 2 (two) times daily.     Not Delegated - Dermatology:  Corticosteroids Failed - 02/02/2024  9:50 AM      Failed - This refill cannot be delegated      Passed - Valid encounter within last 12 months    Recent Outpatient Visits           2 months ago Well adult exam   [place] [facility] Mecum, Erin E, PA-C   4 months ago Class 1 obesity with body mass index (BMI) of 30.0 to 30.9 in adult, unspecified obesity type,  unspecified whether serious comorbidity present   [facility] Danelle Berry, PA-C   4 months ago Vaginal dryness   [facility] Margarita Mail, DO   7 months ago Primary hypertension   [place] [facility] Danelle Berry, PA-C   8 months ago Upper respiratory tract infection, unspecified type   [facility] Danelle Berry, PA-C       Future Appointments             In 3 months Danelle Berry, PA-C [facility]

## 2024-02-02 NOTE — Assessment & Plan Note (Signed)
 Remains well controlled with albuterol prn

## 2024-02-02 NOTE — Progress Notes (Signed)
 Patient ID: Ruth Bishop, female    DOB: 1946/11/15, 78 y.o.   MRN: [id number]  PCP: Danelle Berry, PA-C  Chief Complaint  Patient presents with   Medication Refill    Ketoconazole shampoo 2% and Clobetsol propianate    Subjective:   Ruth Bishop is a 78 y.o. female, presents to clinic with CC of the following:  HPI  Here requesting meds that have not been previously prescribed or managed by PCP She is concerned with scalp rash/seborrheic dermatitis and hair loss after starting wegovy then tirzepatide she has on her own found a Rx company that compounds and sends her meds after insurance would not cover Rx from PCP last year She consulted with dermatology in [place] and she did not like the MD.  He prescribed ketoconazole shampoo and she asks for clobetasole steroid which she believes will help her grown her hair back. - per chart review she was on clobetasol cream from 2017 to 2023 through outside provider   She also complains of urge incontinence which is new and happens maybe a few times a week and she is also concerned this may be a drug side effect. She did switch to tirzepatide and then started to get medications compounded from some company she found online.  The last prescription pen prescribed and filled was in October  She reports working on her diet and activity she is walking 3 miles every other day She reports in total losing about 17 pounds Wt Readings from Last 5 Encounters:  02/02/24 192 lb (87.1 kg)  09/27/23 205 lb (93 kg)  09/09/23 205 lb 11.2 oz (93.3 kg)  09/06/23 207 lb 4 oz (94 kg)  06/14/23 216 lb 11.2 oz (98.3 kg)   BMI Readings from Last 5 Encounters:  02/02/24 28.35 kg/m  11/23/23 30.27 kg/m  09/27/23 30.27 kg/m  09/09/23 30.38 kg/m  09/06/23 30.17 kg/m     Patient Active Problem List   Diagnosis Date Noted   Advanced care planning/counseling discussion 11/23/2023   Hypertriglyceridemia 03/20/2022   Chronic dermatitis of hands  03/20/2022   Class 1 obesity with body mass index (BMI) of 32.0 to 32.9 in adult 03/20/2022   Lung nodules 03/26/2020   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   Elevated C-reactive protein (CRP) 02/14/2018   Elevated antinuclear antibody (ANA) level 02/14/2018   First degree AV block 09/17/2017   Left atrial enlargement 09/17/2017   Aortic regurgitation 06/17/2016   Mitral regurgitation 06/17/2016   Diastolic dysfunction 06/17/2016   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 , Disp: , Rfl:    clobetasol (TEMOVATE) 0.05 % external solution, Apply 1 Application topically 2 (two) times daily as needed (to affected areas of scalp)., Disp: 50 mL, Rfl: 1   estradiol (ESTRACE VAGINAL) 0.1 MG/GM vaginal cream, Insert 0.5 g of cream intravaginally, administered daily for 2 weeks, then reduce to twice weekly., Disp: 42.5 g, Rfl: 9   fluticasone (FLONASE) 50 MCG/ACT nasal spray,  Place 2 sprays into both nostrils daily., Disp: 16 g, Rfl: 5   [START ON 02/03/2024] ketoconazole (NIZORAL) 2 % shampoo, Apply 1 Application topically 2 (two) times a week., Disp: 120 mL, Rfl: 5   levocetirizine (XYZAL) 5 MG tablet, Take 1 tablet (5 mg total) by mouth every evening., Disp: 90 tablet, Rfl: 1   MULTIPLE VITAMIN PO, Take 1 tablet by mouth daily. , Disp: , Rfl:    nystatin (MYCOSTATIN/NYSTOP) powder, Apply 1 Application topically 3 (three) times daily between meals as needed (to affected skin fold/rash as  needed)., Disp: 60 g, Rfl: 0   pantoprazole (PROTONIX) 40 MG tablet, Take 1 tablet (40 mg total) by mouth 2 (two) times daily., Disp: 180 tablet, Rfl: 0   psyllium (METAMUCIL) 0.52 g capsule, Take 0.52 g by mouth daily., Disp: , Rfl:    tirzepatide (ZEPBOUND) 10 MG/0.5ML Pen, Inject 10 mg into the skin once a week., Disp: , Rfl:    TRI-LUMA 0.01-4-0.05 % CREA, SMARTSIG:1 Sparingly Topical Every Night, Disp: , Rfl:    triamcinolone ointment (KENALOG) 0.5 %, APPLY TOPICALLY TO AFFECTED  AREA(S) OF ECZEMA/RASHES TWICE  DAILY AS NEEDED . DO NOT APPLY  TO FACE, Disp: 30 g, Rfl: 0   TURMERIC PO, Take 1 tablet by mouth daily. , Disp: , Rfl:    valsartan-hydrochlorothiazide (DIOVAN-HCT) 320-12.5 MG tablet, Take 1 tablet by mouth daily., Disp: 90 tablet, Rfl: 3   Allergies  Allergen Reactions   Nitrofurantoin Monohyd Macro      Social History   Tobacco Use   Smoking status: Never   Smokeless tobacco: Never   Tobacco comments:    smoking cessation materials not required  Vaping Use   Vaping status: Never Used  Substance Use Topics   Alcohol use: Not Currently    Alcohol/week: 0.0 standard drinks of alcohol   Drug use: No      Chart Review Today: I personally reviewed active problem list, medication list, allergies, family history, social history, health maintenance, notes from last encounter, lab results, imaging with the patient/caregiver today.   Review of Systems  Constitutional: Negative.   HENT: Negative.    Eyes: Negative.   Respiratory: Negative.    Cardiovascular: Negative.   Gastrointestinal: Negative.   Endocrine: Negative.   Genitourinary: Negative.   Musculoskeletal: Negative.   Skin: Negative.   Allergic/Immunologic: Negative.   Neurological: Negative.   Hematological: Negative.   Psychiatric/Behavioral: Negative.    All other systems reviewed and are negative.      Objective:   Vitals:   02/02/24 0816  BP: 134/78  Pulse: 87  Resp: 16  SpO2: 98%   Weight: 192 lb (87.1 kg)  Height: 5\' 9"  (1.753 m)    Body mass index is 28.35 kg/m.  Physical Exam Vitals and nursing note reviewed.  Constitutional:      General: She is not in acute distress.    Appearance: Normal appearance. She is well-developed. She is not ill-appearing, toxic-appearing or diaphoretic.  HENT:     Head: Normocephalic and atraumatic.     Nose: Nose normal.  Eyes:     General:        Right eye: No discharge.        Left eye: No discharge.     Conjunctiva/sclera: Conjunctivae normal.  Neck:     Trachea: No tracheal deviation.  Cardiovascular:     Rate and Rhythm: Normal rate and regular rhythm.  Pulmonary:     Effort: Pulmonary effort is normal.  No respiratory distress.     Breath sounds: No stridor.  Musculoskeletal:        General: Normal range of motion.  Skin:    General: Skin is warm and dry.     Findings: Rash present.     Comments: Scalp with diffuse thinning of hair - it is cut short, few scattered scalp patches/rash  Bilateral hands with appearance of skin tightening and peeling skin to all fingers  Neurological:     Mental Status: She is alert.     Motor: No abnormal muscle tone.     Coordination: Coordination normal.  Psychiatric:        Behavior: Behavior normal.      Results for orders placed or performed in visit on 11/23/23  TSH   Collection Time: 11/23/23  2:27 PM  Result Value Ref Range   TSH 1.09 0.40 - 4.50 mIU/L  Hemoglobin A1c   Collection Time: 11/23/23  2:27 PM  Result Value Ref Range   Hgb A1c MFr Bld 5.6 <5.7 % of total Hgb   Mean Plasma Glucose 114 mg/dL   eAG (mmol/L) 6.3 mmol/L  Lipid panel   Collection Time: 11/23/23  2:27 PM  Result Value Ref Range   Cholesterol 149 <200 mg/dL   HDL 52 > OR = 50 mg/dL   Triglycerides 161 <096 mg/dL   LDL Cholesterol (Calc) 77 mg/dL (calc)   Total CHOL/HDL Ratio 2.9 <5.0 (calc)   Non-HDL Cholesterol (Calc) 97 <045 mg/dL (calc)  CBC with Differential/Platelet   Collection  Time: 11/23/23  2:27 PM  Result Value Ref Range   WBC 6.4 3.8 - 10.8 Thousand/uL   RBC 4.78 3.80 - 5.10 Million/uL   Hemoglobin 12.3 11.7 - 15.5 g/dL   HCT 40.9 81.1 - 91.4 %   MCV 83.5 80.0 - 100.0 fL   MCH 25.7 (L) 27.0 - 33.0 pg   MCHC 30.8 (L) 32.0 - 36.0 g/dL   RDW 78.2 95.6 - 21.3 %   Platelets 341 140 - 400 Thousand/uL   MPV 10.7 7.5 - 12.5 fL   Neutro Abs 3,117 1,500 - 7,800 cells/uL   Absolute Lymphocytes 2,701 850 - 3,900 cells/uL   Absolute Monocytes 416 200 - 950 cells/uL   Eosinophils Absolute 128 15 - 500 cells/uL   Basophils Absolute 38 0 - 200 cells/uL   Neutrophils Relative % 48.7 %   Total Lymphocyte 42.2 %   Monocytes Relative 6.5 %   Eosinophils Relative 2.0 %   Basophils Relative 0.6 %  COMPLETE METABOLIC PANEL WITH GFR   Collection Time: 11/23/23  2:27 PM  Result Value Ref Range   Glucose, Bld 88 65 - 99 mg/dL   BUN 15 7 - 25 mg/dL   Creat 0.86 5.78 - 4.69 mg/dL   eGFR 63 > OR = 60 GE/XBM/8.41L2   BUN/Creatinine Ratio SEE NOTE: 6 - 22 (calc)   Sodium 139 135 - 146 mmol/L   Potassium 4.2 3.5 - 5.3 mmol/L   Chloride 102 98 - 110 mmol/L   CO2 29 20 - 32 mmol/L   Calcium 9.7 8.6 - 10.4 mg/dL   Total Protein 7.0 6.1 - 8.1 g/dL   Albumin 3.8 3.6 - 5.1 g/dL   Globulin 3.2 1.9 - 3.7 g/dL (calc)   AG Ratio 1.2 1.0 - 2.5 (calc)   Total Bilirubin 0.5 0.2 - 1.2 mg/dL   Alkaline phosphatase (APISO) 85 37 - 153 U/L   AST 15 10 - 35 U/L  ALT 11 6 - 29 U/L       Assessment & Plan:       1. Seborrheic dermatitis  ketoconazole (NIZORAL) 2 % shampoo   clobetasol (TEMOVATE) 0.05 % external solution   refill on ketoconazole shampoo, can use clobetasole but recommended to use more sparingly to only areas of scalp with raised patches    2. Alopecia     pt can also try over-the-counter minoxidil foam, she may want to follow-up with dermatology for further evaluation.  I do not know if the compounded drugs could have caused her hair loss, and I also explained that  tx for seborrheic dermatitis that she has done in the past may not reverse a drug side effect.     3. Reactive airways dysfunction syndrome (HCC) Assessment & Plan: Remains well controlled with albuterol prn                Danelle Berry, PA-C 02/02/24 9:14 AM

## 2024-02-09 ENCOUNTER — Other Ambulatory Visit: Payer: Self-pay | Admitting: Family Medicine

## 2024-02-09 DIAGNOSIS — R630 Anorexia: Secondary | ICD-10-CM

## 2024-02-09 DIAGNOSIS — R1906 Epigastric swelling, mass or lump: Secondary | ICD-10-CM

## 2024-02-09 DIAGNOSIS — R140 Abdominal distension (gaseous): Secondary | ICD-10-CM

## 2024-03-01 DIAGNOSIS — H903 Sensorineural hearing loss, bilateral: Secondary | ICD-10-CM | POA: Diagnosis not present

## 2024-03-17 DIAGNOSIS — H905 Unspecified sensorineural hearing loss: Secondary | ICD-10-CM | POA: Diagnosis not present

## 2024-03-22 DIAGNOSIS — M1712 Unilateral primary osteoarthritis, left knee: Secondary | ICD-10-CM | POA: Diagnosis not present

## 2024-03-22 DIAGNOSIS — R262 Difficulty in walking, not elsewhere classified: Secondary | ICD-10-CM | POA: Diagnosis not present

## 2024-03-22 DIAGNOSIS — M25562 Pain in left knee: Secondary | ICD-10-CM | POA: Diagnosis not present

## 2024-03-22 DIAGNOSIS — M25662 Stiffness of left knee, not elsewhere classified: Secondary | ICD-10-CM | POA: Diagnosis not present

## 2024-03-30 DIAGNOSIS — M1712 Unilateral primary osteoarthritis, left knee: Secondary | ICD-10-CM | POA: Diagnosis not present

## 2024-03-30 DIAGNOSIS — R262 Difficulty in walking, not elsewhere classified: Secondary | ICD-10-CM | POA: Diagnosis not present

## 2024-03-30 DIAGNOSIS — M25562 Pain in left knee: Secondary | ICD-10-CM | POA: Diagnosis not present

## 2024-03-30 DIAGNOSIS — M25662 Stiffness of left knee, not elsewhere classified: Secondary | ICD-10-CM | POA: Diagnosis not present

## 2024-04-13 DIAGNOSIS — M25562 Pain in left knee: Secondary | ICD-10-CM | POA: Diagnosis not present

## 2024-04-13 DIAGNOSIS — R262 Difficulty in walking, not elsewhere classified: Secondary | ICD-10-CM | POA: Diagnosis not present

## 2024-04-13 DIAGNOSIS — M25662 Stiffness of left knee, not elsewhere classified: Secondary | ICD-10-CM | POA: Diagnosis not present

## 2024-04-13 DIAGNOSIS — M1712 Unilateral primary osteoarthritis, left knee: Secondary | ICD-10-CM | POA: Diagnosis not present

## 2024-04-20 DIAGNOSIS — M25562 Pain in left knee: Secondary | ICD-10-CM | POA: Diagnosis not present

## 2024-04-20 DIAGNOSIS — R262 Difficulty in walking, not elsewhere classified: Secondary | ICD-10-CM | POA: Diagnosis not present

## 2024-04-20 DIAGNOSIS — M25662 Stiffness of left knee, not elsewhere classified: Secondary | ICD-10-CM | POA: Diagnosis not present

## 2024-04-20 DIAGNOSIS — M1712 Unilateral primary osteoarthritis, left knee: Secondary | ICD-10-CM | POA: Diagnosis not present

## 2024-04-27 DIAGNOSIS — M1712 Unilateral primary osteoarthritis, left knee: Secondary | ICD-10-CM | POA: Diagnosis not present

## 2024-04-27 DIAGNOSIS — M25562 Pain in left knee: Secondary | ICD-10-CM | POA: Diagnosis not present

## 2024-04-27 DIAGNOSIS — M25662 Stiffness of left knee, not elsewhere classified: Secondary | ICD-10-CM | POA: Diagnosis not present

## 2024-04-27 DIAGNOSIS — R262 Difficulty in walking, not elsewhere classified: Secondary | ICD-10-CM | POA: Diagnosis not present

## 2024-05-23 ENCOUNTER — Encounter: Payer: Self-pay | Admitting: Family Medicine

## 2024-05-23 ENCOUNTER — Ambulatory Visit: Payer: Medicare HMO | Admitting: Internal Medicine

## 2024-05-23 ENCOUNTER — Ambulatory Visit (INDEPENDENT_AMBULATORY_CARE_PROVIDER_SITE_OTHER): Payer: Self-pay | Admitting: Family Medicine

## 2024-05-23 VITALS — BP 126/68 | HR 66 | Resp 16 | Ht 69.0 in | Wt 191.0 lb

## 2024-05-23 DIAGNOSIS — I10 Essential (primary) hypertension: Secondary | ICD-10-CM

## 2024-05-23 DIAGNOSIS — J683 Other acute and subacute respiratory conditions due to chemicals, gases, fumes and vapors: Secondary | ICD-10-CM | POA: Diagnosis not present

## 2024-05-23 DIAGNOSIS — R7303 Prediabetes: Secondary | ICD-10-CM | POA: Diagnosis not present

## 2024-05-23 DIAGNOSIS — M545 Low back pain, unspecified: Secondary | ICD-10-CM

## 2024-05-23 DIAGNOSIS — E781 Pure hyperglyceridemia: Secondary | ICD-10-CM

## 2024-05-23 MED ORDER — BACLOFEN 5 MG PO TABS: 5.0000 mg | ORAL_TABLET | Freq: Three times a day (TID) | ORAL | 3 refills | Status: AC | PRN

## 2024-05-23 NOTE — Progress Notes (Unsigned)
 Name: Adrien Dietzman   MRN: [id number]    DOB: 02/21/1946   Date:05/23/2024       Progress Note  Chief Complaint  Patient presents with   Medical Management of Chronic Issues   Hypertension   Prediabetes     Subjective:   Ruth Bishop is a 78 y.o. female, presents to clinic for routine follow up on chronic conditions  HTN managed with valsartan -HCTZ 320-12.5 BP Readings from Last 3 Encounters:  05/23/24 126/68  02/02/24 134/78  11/23/23 118/74   Lab Results  Component Value Date   HGBA1C 5.6 11/23/2023   Weight - she is walking daily several miles and keeping her weight off Wt Readings from Last 5 Encounters:  05/23/24 191 lb (86.6 kg)  02/02/24 192 lb (87.1 kg)  09/27/23 205 lb (93 kg)  09/09/23 205 lb 11.2 oz (93.3 kg)  09/06/23 207 lb 4 oz (94 kg)   BMI Readings from Last 5 Encounters:  05/23/24 28.21 kg/m  02/02/24 28.35 kg/m  11/23/23 30.27 kg/m  09/27/23 30.27 kg/m  09/09/23 30.38 kg/m    Other arthritis and hx of OA Low back pain not new she thinks its arthitis she is doing stretches walking       Current Outpatient Medications:    albuterol  (VENTOLIN  HFA) 108 (90 Base) MCG/ACT inhaler, Inhale 2 puffs into the lungs every 4 (four) hours as needed for wheezing or shortness of breath., Disp: 18 g, Rfl: 1   aspirin EC 81 MG tablet, Take 81 mg by mouth daily., Disp: , Rfl:    Calcium Carbonate-Vitamin D 600-400 MG-UNIT tablet, Take 1 tablet by mouth daily. , Disp: , Rfl:    fluticasone  (FLONASE ) 50 MCG/ACT nasal spray, Place 2 sprays into both nostrils daily., Disp: 16 g, Rfl: 5   ketoconazole  (NIZORAL ) 2 % shampoo, Apply 1 Application topically 2 (two) times a week., Disp: 120 mL, Rfl: 5   levocetirizine (XYZAL ) 5 MG tablet, Take 1 tablet (5 mg total) by mouth every evening., Disp: 90 tablet, Rfl: 1   MULTIPLE VITAMIN PO, Take 1 tablet by mouth daily. , Disp: , Rfl:    pantoprazole  (PROTONIX ) 40 MG tablet, TAKE 1 TABLET BY MOUTH TWICE  DAILY,  Disp: 180 tablet, Rfl: 3   psyllium (METAMUCIL) 0.52 g capsule, Take 0.52 g by mouth daily., Disp: , Rfl:    tirzepatide  (ZEPBOUND ) 10 MG/0.5ML Pen, Inject 10 mg into the skin once a week., Disp: , Rfl:    TRI-LUMA 0.01-4-0.05 % CREA, SMARTSIG:1 Sparingly Topical Every Night, Disp: , Rfl:    TURMERIC PO, Take 1 tablet by mouth daily. 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   Elevated antinuclear antibody (ANA) level 02/14/2018   First degree AV block 09/17/2017   Left atrial enlargement 09/17/2017   Aortic regurgitation 06/17/2016   Mitral regurgitation 06/17/2016   Diastolic dysfunction 06/17/2016   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   Gastroesophageal reflux disease 05/24/2015   Primary hypertension 05/24/2015   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    Past Surgical History:  Procedure Laterality Date   ABDOMINAL HYSTERECTOMY     COLONOSCOPY WITH PROPOFOL  N/A 10/01/2015   Procedure: COLONOSCOPY WITH PROPOFOL ;  Surgeon: Gaylyn Keas  Vikki Graves, MD;  Location: [facility] ENDOSCOPY;  Service: Endoscopy;  Laterality: N/A;   ESOPHAGOGASTRODUODENOSCOPY (EGD) WITH PROPOFOL  N/A 10/01/2015   Procedure: ESOPHAGOGASTRODUODENOSCOPY (EGD) WITH PROPOFOL ;  Surgeon: Deveron Fly, MD;  Location: [facility] ENDOSCOPY;  Service: Endoscopy;  Laterality: N/A;    Family History  Problem Relation Age of Onset   Heart disease Mother    Diabetes Mother    Aneurysm Mother    Heart disease Father    Heart attack Father    Heart disease Sister    Diabetes Sister    Pneumonia Sister    Heart disease Brother    Diabetes Brother    Diabetes Sister    Heart disease Sister    Congestive  Heart Failure Sister    Breast cancer Neg Hx     Social History   Tobacco Use   Smoking status: Never   Smokeless tobacco: Never   Tobacco comments:    smoking cessation materials not required  Vaping Use   Vaping status: Never Used  Substance Use Topics   Alcohol use: Not Currently    Alcohol/week: 0.0 standard drinks of alcohol   Drug use: No     Allergies  Allergen Reactions   Nitrofurantoin Monohyd Macro     Health Maintenance  Topic Date Due   COVID-19 Vaccine (7 - 2024-25 season) 06/07/2024 (Originally 02/07/2024)   INFLUENZA VACCINE  07/07/2024   MAMMOGRAM  10/12/2024   Medicare Annual Wellness (AWV)  12/15/2024   Colonoscopy  09/30/2025   DTaP/Tdap/Td (4 - Td or Tdap) 02/28/2034   Pneumococcal Vaccine: 50+ Years  Completed   DEXA SCAN  Completed   Hepatitis C Screening  Completed   Zoster Vaccines- Shingrix  Completed   HPV VACCINES  Aged Out   Meningococcal B Vaccine  Aged Out    Chart Review Today: I personally reviewed active problem list, medication list, allergies, family history, social history, health maintenance, notes from last encounter, lab results, imaging with the patient/caregiver today.   Review of Systems   Objective:   Vitals:   05/23/24 1356  BP: 126/68  Pulse: 66  Resp: 16  SpO2: 99%  Weight: 191 lb (86.6 kg)  Height: 5' 9 (1.753 m)    Body mass index is 28.21 kg/m.  Physical Exam   Functional Status Survey:   Results for orders placed or performed in visit on 11/23/23  TSH   Collection Time: 11/23/23  2:27 PM  Result Value Ref Range   TSH 1.09 0.40 - 4.50 mIU/L  Hemoglobin A1c   Collection Time: 11/23/23  2:27 PM  Result Value Ref Range   Hgb A1c MFr Bld 5.6 <5.7 % of total Hgb   Mean Plasma Glucose 114 mg/dL   eAG (mmol/L) 6.3 mmol/L  Lipid panel   Collection Time: 11/23/23  2:27 PM  Result Value Ref Range   Cholesterol 149 <200 mg/dL   HDL 52 > OR = 50 mg/dL   Triglycerides 130 <865 mg/dL   LDL Cholesterol  (Calc) 77 mg/dL (calc)   Total CHOL/HDL Ratio 2.9 <5.0 (calc)   Non-HDL Cholesterol (Calc) 97 <784 mg/dL (calc)  CBC with Differential/Platelet   Collection Time: 11/23/23  2:27 PM  Result Value Ref Range   WBC 6.4 3.8 - 10.8 Thousand/uL   RBC 4.78 3.80 - 5.10 Million/uL   Hemoglobin 12.3 11.7 - 15.5 g/dL   HCT 69.6 29.5 - 28.4 %   MCV 83.5 80.0 - 100.0 fL   MCH 25.7 (L) 27.0 -  33.0 pg   MCHC 30.8 (L) 32.0 - 36.0 g/dL   RDW 95.6 21.3 - 08.6 %   Platelets 341 140 - 400 Thousand/uL   MPV 10.7 7.5 - 12.5 fL   Neutro Abs 3,117 1,500 - 7,800 cells/uL   Absolute Lymphocytes 2,701 850 - 3,900 cells/uL   Absolute Monocytes 416 200 - 950 cells/uL   Eosinophils Absolute 128 15 - 500 cells/uL   Basophils Absolute 38 0 - 200 cells/uL   Neutrophils Relative % 48.7 %   Total Lymphocyte 42.2 %   Monocytes Relative 6.5 %   Eosinophils Relative 2.0 %   Basophils Relative 0.6 %  COMPLETE METABOLIC PANEL WITH GFR   Collection Time: 11/23/23  2:27 PM  Result Value Ref Range   Glucose, Bld 88 65 - 99 mg/dL   BUN 15 7 - 25 mg/dL   Creat 5.78 4.69 - 6.29 mg/dL   eGFR 63 > OR = 60 BM/WUX/3.24M0   BUN/Creatinine Ratio SEE NOTE: 6 - 22 (calc)   Sodium 139 135 - 146 mmol/L   Potassium 4.2 3.5 - 5.3 mmol/L   Chloride 102 98 - 110 mmol/L   CO2 29 20 - 32 mmol/L   Calcium 9.7 8.6 - 10.4 mg/dL   Total Protein 7.0 6.1 - 8.1 g/dL   Albumin 3.8 3.6 - 5.1 g/dL   Globulin 3.2 1.9 - 3.7 g/dL (calc)   AG Ratio 1.2 1.0 - 2.5 (calc)   Total Bilirubin 0.5 0.2 - 1.2 mg/dL   Alkaline phosphatase (APISO) 85 37 - 153 U/L   AST 15 10 - 35 U/L   ALT 11 6 - 29 U/L      Assessment & Plan:   Primary hypertension  Prediabetes  Hypertriglyceridemia  Reactive airways dysfunction syndrome (HCC)     No follow-ups on file.   Adeline Hone, PA-C 05/23/24 2:01 PM

## 2024-05-23 NOTE — Assessment & Plan Note (Signed)
 Recheck A1c now that she is off of GLP-1s and zepbound , weight has been maintained, we will follow/trend A1c   Lab Results  Component Value Date   HGBA1C 5.6 11/23/2023   HGBA1C 6.0 (H) 06/14/2023   HGBA1C 5.7 (H) 09/16/2022   HGBA1C 5.7 (H) 03/20/2022   HGBA1C 5.6 04/03/2021

## 2024-05-23 NOTE — Patient Instructions (Signed)
 For your back please let me know if it gets worse or if you would like the referral to physical therapy for assessment and treatment I would continue walking, gentle stretching, heat therapy and over the counter medications like tylenol [phone] mg up to three times a day and NSAIDs like aleve, naproxen or ibuprofen (as directed on bottle/box) and then if you get really stiff or have muscle spasms you can try the muscle relaxer.  It can be sedating so do not drive or operate heavy machinery with it in your system.

## 2024-05-23 NOTE — Assessment & Plan Note (Signed)
 BP at goal today and well controlled with current meds and diet/lifestyle efforts, no changes, recheck renal function and electrolytes BP Readings from Last 3 Encounters:  05/23/24 126/68  02/02/24 134/78  11/23/23 118/74

## 2024-05-24 LAB — COMPREHENSIVE METABOLIC PANEL WITH GFR
AG Ratio: 1.4 (calc) (ref 1.0–2.5)
ALT: 9 U/L (ref 6–29)
AST: 15 U/L (ref 10–35)
Albumin: 3.8 g/dL (ref 3.6–5.1)
Alkaline phosphatase (APISO): 75 U/L (ref 37–153)
BUN: 17 mg/dL (ref 7–25)
CO2: 28 mmol/L (ref 20–32)
Calcium: 9.2 mg/dL (ref 8.6–10.4)
Chloride: 105 mmol/L (ref 98–110)
Creat: 0.93 mg/dL (ref 0.60–1.00)
Globulin: 2.7 g/dL (ref 1.9–3.7)
Glucose, Bld: 97 mg/dL (ref 65–99)
Potassium: 4.1 mmol/L (ref 3.5–5.3)
Sodium: 141 mmol/L (ref 135–146)
Total Bilirubin: 0.3 mg/dL (ref 0.2–1.2)
Total Protein: 6.5 g/dL (ref 6.1–8.1)
eGFR: 64 mL/min/{1.73_m2} (ref >=60)

## 2024-05-24 LAB — HEMOGLOBIN A1C
Hgb A1c MFr Bld: 5.8 % — ABNORMAL HIGH (ref <5.7)
Mean Plasma Glucose: 120 mg/dL
eAG (mmol/L): 6.6 mmol/L

## 2024-05-25 ENCOUNTER — Ambulatory Visit: Payer: Self-pay | Admitting: Family Medicine

## 2024-05-31 NOTE — Assessment & Plan Note (Signed)
 Lipid panel checked dec with CPE -  Trigs in normal range/improved With diet/lifestyle weight loss and likely some improvement due to zepbound  med Lab Results  Component Value Date   CHOL 149 11/23/2023   HDL 52 11/23/2023   LDLCALC 77 11/23/2023   TRIG 116 11/23/2023   CHOLHDL 2.9 11/23/2023

## 2024-05-31 NOTE — Assessment & Plan Note (Signed)
 Remains well controlled with albuterol prn

## 2024-07-18 DIAGNOSIS — L7211 Pilar cyst: Secondary | ICD-10-CM | POA: Diagnosis not present

## 2024-07-18 DIAGNOSIS — L648 Other androgenic alopecia: Secondary | ICD-10-CM | POA: Diagnosis not present

## 2024-07-18 DIAGNOSIS — L608 Other nail disorders: Secondary | ICD-10-CM | POA: Diagnosis not present

## 2024-07-25 DIAGNOSIS — M1711 Unilateral primary osteoarthritis, right knee: Secondary | ICD-10-CM | POA: Diagnosis not present

## 2024-08-03 DIAGNOSIS — L7211 Pilar cyst: Secondary | ICD-10-CM | POA: Diagnosis not present

## 2024-08-08 ENCOUNTER — Ambulatory Visit: Attending: Cardiovascular Disease | Admitting: Cardiovascular Disease

## 2024-08-08 ENCOUNTER — Ambulatory Visit: Payer: Self-pay | Admitting: *Deleted

## 2024-08-08 ENCOUNTER — Telehealth: Payer: Self-pay | Admitting: Cardiovascular Disease

## 2024-08-08 ENCOUNTER — Encounter: Payer: Self-pay | Admitting: Cardiovascular Disease

## 2024-08-08 VITALS — BP 120/60 | HR 67 | Ht 69.0 in | Wt 188.0 lb

## 2024-08-08 DIAGNOSIS — I10 Essential (primary) hypertension: Secondary | ICD-10-CM

## 2024-08-08 DIAGNOSIS — R5383 Other fatigue: Secondary | ICD-10-CM | POA: Diagnosis not present

## 2024-08-08 NOTE — Progress Notes (Signed)
 Cardiology Office Note  Date:  08/08/2024   ID:  Ruth Bishop, DOB 1946-11-11, MRN [id number]  PCP:  Leavy Mole, PA-C   Chief Complaint  Patient presents with   Fatigue    Patient c/o fatigue, irregular heart beats, weakness & shortness of breath.     HPI:  [name] is a 78 year old woman with past medical history of HTN Fatigue Chest pain Who presents for follow-up of her chest pain sx, costochondritis, new fatigue  Last seen by myself in clinic 9/24  Walks Mon/Wed/Fri 3 miles Sleeping more  past week, with poor energy past few days of unclear etiology  Blood pressure normal, EKG unchanged, taking all her medications and supplements Denies viral type symptoms  Planning trip to [place]  for 2026 Completed cruise, [place]  2024  Previously playing tennis, had to stop secondary to knee pain, cortisone Following low impact exercise regiment  No chest pain or shortness of breath concerning for angina  Lab work reviewed Total cholesterol 149 ,  LDL 77 A1c 5.8  EKG personally reviewed by myself on todays visit EKG Interpretation Date/Time:  Tuesday August 08 2024 14:25:17 EDT Ventricular Rate:  67 PR Interval:  168 QRS Duration:  84 QT Interval:  416 QTC Calculation: 439 R Axis:   48  Text Interpretation: Normal sinus rhythm Minimal voltage criteria for LVH, may be normal variant ( Sokolow-Lyon ) When compared with ECG of 06-Sep-2023 09:08, No significant change was found Confirmed by Perla Lye [phone]) on 08/08/2024 2:58:01 PM    Other past medical history reviewed Seen by rheumatology at [facility], then [facility] Elevated CRP and inflammatory markers Reports symptoms are typically well controlled with heating pack, NSAIDs  Remote history of having epidural injection to her chest for pain relief   No chest pain or shortness of breath on exertion  CT scan images from 2010 reviewed showing no PAD, aortic atherosclerosis, no coronary calcification  CT  scan 02/07/2018 No coronary calcifications, no aortic atherosclerosis  CT scan 08/2017, images pulled up in the office As above no coronary calcification or aortic atherosclerosis  Echo 10/2017  Left ventricle: EF 60% to 65%.  Echocardiogram from June 2014 was essentially normal ejection fraction 60-65%  PMH:   has a past medical history of Abnormal chest x-ray (10/18/2018), Advanced care planning/counseling discussion (11/23/2023), Arthritis, Chronic pain (05/24/2015), Class 1 obesity with body mass index (BMI) of 32.0 to 32.9 in adult (03/20/2022), Diastolic dysfunction (06/17/2016), GERD (gastroesophageal reflux disease), Heart murmur, Hormone replacement therapy (05/26/2016), Hypertension, Hypertriglyceridemia (03/20/2022), Incidental lung nodule, > 3mm and < 8mm (08/04/2017), Osteoarthritis of both knees (06/13/2015), Post menopausal syndrome (05/24/2015), and Skin-picking disorder (05/24/2015).  PSH:    Past Surgical History:  Procedure Laterality Date   ABDOMINAL HYSTERECTOMY     COLONOSCOPY WITH PROPOFOL  N/A 10/01/2015   Procedure: COLONOSCOPY WITH PROPOFOL ;  Surgeon: Gladis RAYMOND Mariner, MD;  Location: [facility] ENDOSCOPY;  Service: Endoscopy;  Laterality: N/A;   ESOPHAGOGASTRODUODENOSCOPY (EGD) WITH PROPOFOL  N/A 10/01/2015   Procedure: ESOPHAGOGASTRODUODENOSCOPY (EGD) WITH PROPOFOL ;  Surgeon: Gladis RAYMOND Mariner, MD;  Location: [facility] ENDOSCOPY;  Service: Endoscopy;  Laterality: N/A;    Current Outpatient Medications  Medication Sig Dispense Refill   albuterol  (VENTOLIN  HFA) 108 (90 Base) MCG/ACT inhaler Inhale 2 puffs into the lungs every 4 (four) hours as needed for wheezing or shortness of breath. 18 g 1   aspirin EC 81 MG tablet Take 81 mg by mouth daily.     Baclofen  5 MG TABS Take 1 tablet (5 mg  total) by mouth 3 (three) times daily as needed (msk pain or spasms). 90 tablet 3   Calcium Carbonate-Vitamin D 600-400 MG-UNIT tablet Take 1 tablet by mouth daily.      fluticasone  (FLONASE )  50 MCG/ACT nasal spray Place 2 sprays into both nostrils daily. 16 g 5   ketoconazole  (NIZORAL ) 2 % shampoo Apply 1 Application topically 2 (two) times a week. 120 mL 5   levocetirizine (XYZAL ) 5 MG tablet Take 1 tablet (5 mg total) by mouth every evening. 90 tablet 1   MULTIPLE VITAMIN PO Take 1 tablet by mouth daily.      pantoprazole  (PROTONIX ) 40 MG tablet TAKE 1 TABLET BY MOUTH TWICE  DAILY 180 tablet 3   psyllium (METAMUCIL) 0.52 g capsule Take 0.52 g by mouth daily.     TURMERIC PO Take 1 tablet by mouth daily.      valsartan -hydrochlorothiazide  (DIOVAN -HCT) 320-12.5 MG tablet Take 1 tablet by mouth daily. 90 tablet 3   No current facility-administered medications for this visit.    Allergies:   Nitrofurantoin and Nitrofurantoin monohyd macro   Social History:  The patient  reports that she has never smoked. She has never used smokeless tobacco. She reports that she does not currently use alcohol. She reports that she does not use drugs.   Family History:   family history includes Aneurysm in her mother; Congestive Heart Failure in her sister; Diabetes in her brother, mother, sister, and sister; Heart attack in her father; Heart disease in her brother, father, mother, sister, and sister; Pneumonia in her sister.    Review of Systems: Review of Systems  Constitutional: Negative.   HENT: Negative.    Respiratory: Negative.    Cardiovascular: Negative.   Gastrointestinal: Negative.   Musculoskeletal: Negative.   Neurological: Negative.   Psychiatric/Behavioral: Negative.    All other systems reviewed and are negative.   PHYSICAL EXAM: VS:  BP 120/60 (BP Location: Left Arm, Patient Position: Sitting, Cuff Size: Normal)   Pulse 67   Ht 5' 9 (1.753 m)   Wt 188 lb (85.3 kg)   SpO2 100%   BMI 27.76 kg/m  , BMI Body mass index is 27.76 kg/m. Constitutional:  oriented to person, place, and time. No distress.  HENT:  Head: Grossly normal Eyes:  no discharge. No scleral  icterus.  Neck: No JVD, no carotid bruits  Cardiovascular: Regular rate and rhythm, no murmurs appreciated Pulmonary/Chest: Clear to auscultation bilaterally, no wheezes or rales Abdominal: Soft.  no distension.  no tenderness.  Musculoskeletal: Normal range of motion Neurological:  normal muscle tone. Coordination normal. No atrophy Skin: Skin warm and dry Psychiatric: normal affect, pleasant    Recent Labs: 11/23/2023: Hemoglobin 12.3; Platelets 341; TSH 1.09 05/23/2024: ALT 9; BUN 17; Creat 0.93; Potassium 4.1; Sodium 141    Lipid Panel Lab Results  Component Value Date   CHOL 149 11/23/2023   HDL 52 11/23/2023   LDLCALC 77 11/23/2023   TRIG 116 11/23/2023      Wt Readings from Last 3 Encounters:  08/08/24 188 lb (85.3 kg)  05/23/24 191 lb (86.6 kg)  02/02/24 192 lb (87.1 kg)     ASSESSMENT AND PLAN:  Other chronic pain Prior history of chest discomfort, felt to be costochondritis No recent chest pain symptoms  Hypertension goal BP (blood pressure)  Blood pressure is well controlled on today's visit. No changes made to the medications.  Other fatigue Etiology unclear, symptoms past several days From cardiac perspective appears stable  Costochondritis Previously with elevated ANA/elevated CRP seen by rheumatology at [facility] and in [place] No recent issues  Borderline diabetes We have encouraged continued exercise, careful diet management in an effort to lose weight.    Orders Placed This Encounter  Procedures   EKG 12-Lead     Signed, Velinda Lunger, M.D., Ph.D. 08/08/2024  [facility] [place], [place] [phone]

## 2024-08-08 NOTE — Patient Instructions (Addendum)

## 2024-08-08 NOTE — Telephone Encounter (Signed)
 Called and spoke with the patient regarding her call.  Patient does not have any heart rate or blood pressure numbers to report.  Patient states that feel like my heart is slowing down and feels like she has no energy.  Patient denies any other symptoms like chest pain, shortness of breat, lightheadedness, or dizziness.  Patient advised to go to an urgent care, the emergency room, or her primary care doctor for a thorough evaluation.  Patient wants to be seen by Dr Gollan.  Patient advised that it would be easier to be seen in our office by one of the APPs.  Patient agreed to that.  Forwarded message to Scheduling to have patient come in for an office visit.

## 2024-08-08 NOTE — Telephone Encounter (Signed)
 Copied from CRM #[id number]. Topic: Clinical - Red Word Triage >> Aug 08, 2024  1:26 PM Rachelle R wrote: [facility] that prompted transfer to Nurse Triage: Patient states the last couple of days she has no energy, is feeling fatigue and weak and her heart rate seems to be slower than usual. Reason for Disposition  [1] MODERATE weakness (e.g., interferes with work, school, normal activities) AND [2] persists > 3 days  Answer Assessment - Initial Assessment Questions 1. DESCRIPTION: Describe how you are feeling.     I'm tired and no energy and my heart is beating slower than usual.  No coughing, no fever, no sore throat.   I don't understand what is going on.  I called my heart doctor but he can't see me for months. Sunday I went to church.   I was sneezing and my nose was running so I took a Mucinex .  I've never felt like this.   It hurts just to get up and walk and I'm out of breath.   My family has history of heart problems.   My last visit with my heart doctor everything was fine.    2. SEVERITY: How bad is it?  Can you stand and walk?     I'm so tired and I short of breath.    Pt mentioned heart rate is slower than usual.   Let me get my watch.   Apple watches.   Now 78 beats a min.   I've never felt like this before.     3. ONSET: When did these symptoms begin? (e.g., hours, days, weeks, months)     The last couple of days.   4. CAUSE: What do you think is causing the weakness or fatigue? (e.g., not drinking enough fluids, medical problem, trouble sleeping)     I don't know.   I took a Mucinex  thinking I was coming down with something.   I'm not feeling sick just tired.    5. NEW MEDICINES:  Have you started on any new medicines recently? (e.g., opioid pain medicines, benzodiazepines, muscle relaxants, antidepressants, antihistamines, neuroleptics, beta blockers)     No 6. OTHER SYMPTOMS: Do you have any other symptoms? (e.g., chest pain, fever, cough, SOB, vomiting, diarrhea,  bleeding, other areas of pain)     No other symptoms 7. PREGNANCY: Is there any chance you are pregnant? When was your last menstrual period?     N/A due to age  Protocols used: Weakness (Generalized) and Fatigue-A-AH FYI Only or Action Required?: FYI only for provider.  Patient was last seen in primary care on 09/09/2023 by Bernardo Fend, DO.  Called Nurse Triage reporting Fatigue.mild shortness of breath  Symptoms began yesterday.Just feeling very tired and fatigued.   No other symptoms.   Heart rate was in 70s and 80s on Apple watch.   Mild shortness of breath.    Interventions attempted: Nothing.  Symptoms are: rapidly worsening Fatigued.  Triage Disposition: See Physician Within 24 Hours  Patient/caregiver understands and will follow disposition?: Yes

## 2024-08-08 NOTE — Telephone Encounter (Signed)
 STAT if HR is under 50 or over 120  (normal HR is 60-100 beats per minute)  What is your heart rate? Unsure  Do you have a log of your heart rate readings (document readings)? No  Do you have any other symptoms? Low energy, tired

## 2024-08-09 ENCOUNTER — Ambulatory Visit: Admitting: Internal Medicine

## 2024-08-21 ENCOUNTER — Ambulatory Visit (INDEPENDENT_AMBULATORY_CARE_PROVIDER_SITE_OTHER): Admitting: Family Medicine

## 2024-08-21 ENCOUNTER — Encounter: Payer: Self-pay | Admitting: Family Medicine

## 2024-08-21 VITALS — BP 120/66 | HR 65 | Resp 16 | Ht 69.0 in | Wt 188.0 lb

## 2024-08-21 DIAGNOSIS — R5383 Other fatigue: Secondary | ICD-10-CM | POA: Diagnosis not present

## 2024-08-21 DIAGNOSIS — R1906 Epigastric swelling, mass or lump: Secondary | ICD-10-CM

## 2024-08-21 DIAGNOSIS — R0609 Other forms of dyspnea: Secondary | ICD-10-CM | POA: Diagnosis not present

## 2024-08-21 DIAGNOSIS — R079 Chest pain, unspecified: Secondary | ICD-10-CM | POA: Diagnosis not present

## 2024-08-21 DIAGNOSIS — R140 Abdominal distension (gaseous): Secondary | ICD-10-CM

## 2024-08-21 DIAGNOSIS — R630 Anorexia: Secondary | ICD-10-CM | POA: Diagnosis not present

## 2024-08-21 DIAGNOSIS — M940 Chondrocostal junction syndrome [Tietze]: Secondary | ICD-10-CM | POA: Diagnosis not present

## 2024-08-21 DIAGNOSIS — R918 Other nonspecific abnormal finding of lung field: Secondary | ICD-10-CM

## 2024-08-21 MED ORDER — PANTOPRAZOLE SODIUM 40 MG PO TBEC: 40.0000 mg | DELAYED_RELEASE_TABLET | Freq: Two times a day (BID) | ORAL | 3 refills | Status: AC

## 2024-08-21 NOTE — Progress Notes (Signed)
 "   Patient ID: Ruth Bishop, female    DOB: [birth year], 78 y.o.   MRN: [id number]  PCP: Leavy Mole, PA-C  Chief Complaint  Patient presents with   Fatigue    X2 weeks. Woke one day and felt tired and hasn't felt the same since.   Shortness of Breath    Subjective:   Ruth Bishop is a 78 y.o. female, presents to clinic with CC of the following:  HPI   Tired  Sleeping more past week, with poor energy past few days of unclear etiology      12/16/2023    1:59 PM 11/23/2023    1:39 PM 09/27/2023   11:39 AM  Depression screen PHQ 2/9  Decreased Interest 0 0 0  Down, Depressed, Hopeless 0 0 0  PHQ - 2 Score 0 0 0  Altered sleeping 0 0 0  Tired, decreased energy 0 0 0  Change in appetite 0 0 0  Feeling bad or failure about yourself  0 0 0  Trouble concentrating 0 0 0  Moving slowly or fidgety/restless 0 0 0  Suicidal thoughts 0 0 0  PHQ-9 Score 0 0 0  Difficult doing work/chores Not difficult at all Not difficult at all      Wt Readings from Last 5 Encounters:  08/21/24 188 lb (85.3 kg)  08/08/24 188 lb (85.3 kg)  05/23/24 191 lb (86.6 kg)  02/02/24 192 lb (87.1 kg)  09/27/23 205 lb (93 kg)   BMI Readings from Last 5 Encounters:  08/21/24 27.76 kg/m  08/08/24 27.76 kg/m  05/23/24 28.21 kg/m  02/02/24 28.35 kg/m  11/23/23 30.27 kg/m   Lab Results  Component Value Date   VITAMINB12 1,497 (H) 02/03/2023   Last vitamin D No results found for: 25OHVITD2, 25OHVITD3, VD25OH  Discussed the use of AI scribe software for clinical note transcription with the patient, who gave verbal consent to proceed.  History of Present Illness Ruth Bishop is a 78 year old female with a history of pericarditis who presents with fatigue and chest pressure.  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 hospitalization, with recurrence years later treated effectively with a steroid injection - Recent evaluation including EKG indicated normal cardiac findings  Fatigue - Significant fatigue with decreased exercise tolerance - Previously able to walk three miles every other day; now experiences extreme tiredness after short distances - Resting does not alleviate fatigue - Sleep is adequate - No joint pain or swelling  Unintentional weight loss - Weight decreased from 190 to 188 pounds - No significant weight gain  Pulmonary nodules - History of lung nodules monitored for four years, stable on imaging - Last chest imaging performed in 2020 or 2021 - No recent chest imaging - Concern for potential lung issues  Gastrointestinal symptoms - Currently taking pantoprazole  for gastric symptoms - No current gastrointestinal complaints  Nutritional status - History of elevated B12 level - No other known nutritional deficiencies     Patient Active Problem List   Diagnosis Date Noted   Hypertriglyceridemia 03/20/2022   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   Lung nodules 03/26/2020   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   Elevated antinuclear antibody (ANA) level 02/14/2018   First degree AV block 09/17/2017   Left atrial enlargement 09/17/2017   Prediabetes 11/11/2016  Aortic regurgitation 06/17/2016   Mitral regurgitation 06/17/2016   Diastolic dysfunction 06/17/2016   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   Gastroesophageal reflux disease 05/24/2015   Primary hypertension 05/24/2015   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 , Disp: , Rfl:    fluticasone  (FLONASE ) 50 MCG/ACT nasal spray, Place 2 sprays into both nostrils daily., Disp: 16 g, Rfl: 5   ketoconazole  (NIZORAL ) 2 % shampoo, Apply 1 Application topically 2 (two) times a week., Disp: 120 mL, Rfl: 5   levocetirizine (XYZAL ) 5 MG tablet, Take 1 tablet (5 mg total) by mouth every evening., Disp: 90 tablet, Rfl: 1   MULTIPLE VITAMIN PO, Take 1 tablet by mouth daily. , Disp: , Rfl:    pantoprazole  (PROTONIX ) 40 MG tablet, TAKE 1 TABLET BY MOUTH TWICE  DAILY, Disp: 180 tablet, Rfl: 3   psyllium (METAMUCIL) 0.52 g capsule, Take 0.52 g by mouth daily., Disp: , Rfl:    TURMERIC PO, Take 1 tablet by mouth daily. , Disp: , Rfl:    valsartan -hydrochlorothiazide  (DIOVAN -HCT) 320-12.5 MG tablet, Take 1 tablet by mouth daily., Disp: 90 tablet, Rfl: 3   Allergies  Allergen Reactions   Nitrofurantoin Nausea Only   Nitrofurantoin Monohyd Macro      Social History   Tobacco Use   Smoking status: Never   Smokeless tobacco: Never   Tobacco comments:    smoking cessation materials not required  Vaping Use   Vaping status: Never Used  Substance Use Topics   Alcohol use: Not Currently    Alcohol/week: 0.0 standard drinks of alcohol   Drug use: No      Chart Review Today: I personally reviewed active problem list, medication list, allergies, family history, social history, health maintenance, notes from last encounter, lab results, imaging with the patient/caregiver today.   Review of Systems  Constitutional: Negative.   HENT: Negative.    Eyes: Negative.   Respiratory: Negative.    Cardiovascular: Negative.   Gastrointestinal: Negative.   Endocrine: Negative.   Genitourinary: Negative.   Musculoskeletal: Negative.   Skin: Negative.    Allergic/Immunologic: Negative.   Neurological: Negative.   Hematological: Negative.   Psychiatric/Behavioral: Negative.    All other systems reviewed and are negative.      Objective:   Vitals:   08/21/24 1517  BP: 120/66  Pulse: 65  Resp: 16  SpO2: 98%  Weight: 188 lb (85.3 kg)  Height: 5' 9 (1.753 m)    Body mass index is 27.76 kg/m.  Physical Exam Vitals and nursing note reviewed.  Constitutional:      General: She is not in acute distress.    Appearance: Normal appearance. She is well-developed. She is not ill-appearing, toxic-appearing or diaphoretic.  HENT:     Head: Normocephalic and atraumatic.     Right Ear: External ear normal.     Left Ear: External ear normal.  Nose: Nose normal.  Eyes:     General: No scleral icterus.       Right eye: No discharge.        Left eye: No discharge.     Conjunctiva/sclera: Conjunctivae normal.  Neck:     Trachea: No tracheal deviation.  Cardiovascular:     Rate and Rhythm: Normal rate and regular rhythm. No extrasystoles are present.    Chest Wall: PMI is not displaced.     Pulses: Normal pulses.          Radial pulses are 2+ on the right side and 2+ on the left side.       Posterior tibial pulses are 2+ on the right side and 2+ on the left side.     Heart sounds: Normal heart sounds. Heart sounds not distant. No murmur heard.    No friction rub. No gallop.  Pulmonary:     Effort: Pulmonary effort is normal. No respiratory distress.     Breath sounds: No stridor. No decreased breath sounds, wheezing, rhonchi or rales.  Chest:     Chest wall: Tenderness (anterior chest wall tender) present. No mass, swelling or crepitus.  Abdominal:     General: Bowel sounds are normal.     Palpations: Abdomen is soft.  Musculoskeletal:     Right lower leg: No edema.     Left lower leg: No edema.  Skin:    General: Skin is warm and dry.     Findings: No rash.  Neurological:     Mental Status: She is alert. Mental status is  at baseline.     Motor: No abnormal muscle tone.     Coordination: Coordination normal.     Gait: Gait normal.  Psychiatric:        Attention and Perception: Attention normal.        Mood and Affect: Mood is depressed. Mood is not anxious. Affect is not tearful.        Speech: Speech normal.        Behavior: Behavior normal. Behavior is cooperative.        Thought Content: Thought content normal.      Results for orders placed or performed in visit on 05/23/24  Comprehensive metabolic panel with GFR   Collection Time: 05/23/24  2:39 PM  Result Value Ref Range   Glucose, Bld 97 65 - 99 mg/dL   BUN 17 7 - 25 mg/dL   Creat 9.06 9.39 - 8.99 mg/dL   eGFR 64 > OR = 60 fO/fpw/8.26f7   BUN/Creatinine Ratio SEE NOTE: 6 - 22 (calc)   Sodium 141 135 - 146 mmol/L   Potassium 4.1 3.5 - 5.3 mmol/L   Chloride 105 98 - 110 mmol/L   CO2 28 20 - 32 mmol/L   Calcium 9.2 8.6 - 10.4 mg/dL   Total Protein 6.5 6.1 - 8.1 g/dL   Albumin 3.8 3.6 - 5.1 g/dL   Globulin 2.7 1.9 - 3.7 g/dL (calc)   AG Ratio 1.4 1.0 - 2.5 (calc)   Total Bilirubin 0.3 0.2 - 1.2 mg/dL   Alkaline phosphatase (APISO) 75 37 - 153 U/L   AST 15 10 - 35 U/L   ALT 9 6 - 29 U/L  Hemoglobin A1c   Collection Time: 05/23/24  2:39 PM  Result Value Ref Range   Hgb A1c MFr Bld 5.8 (H) <5.7 %   Mean Plasma Glucose 120 mg/dL   eAG (mmol/L) 6.6 mmol/L  Assessment & Plan:    Assessment & Plan    ICD-10-CM   1. Fatigue, unspecified type  R53.83 CBC with Differential/Platelet    Comprehensive metabolic panel with GFR    TSH   generalized sx and depressed mood, labs to r/o anemia, hypothyroid, electrolyte abnormality Significant fatigue impacts her ability to exercise and perform daily activities. Recent weight loss noted, but not indicative of heart failure due to stable weight. No recent lab work or medication changes reported. Further cardiac evaluation is warranted if symptoms persist. - Perform basic lab tests to rule out  anemia, thyroid  dysfunction, or other metabolic issues. - Discuss symptoms with Dr. Huey and consider further cardiac evaluation if necessary.  Pt seems to have a pattern over the years of depressed mood with fatigue sx that significantly impacts her for short periods of time and then she improves - ddx could include MDD Could possibly try an activating SSRI or wellbutrin? Also some possibly hx of autoimmune disease with flares causing various sx joint pain, chest wall pain, depressed mood/energy and decreased ability to exercise which further depresses mood     2. Abdominal bloating  R14.0 pantoprazole  (PROTONIX ) 40 MG tablet   tx with PPI again, shes had same sx in the past, bloating/decreased appetite and PPi tx for 1-2 months resolved sx    3. Epigastric fullness  R19.06 pantoprazole  (PROTONIX ) 40 MG tablet   after a few bites    4. Decreased appetite  R63.0 pantoprazole  (PROTONIX ) 40 MG tablet  Likely some GERD/gastritis, PPI BID x 2-4 weeks then decrease to once daily    5. DOE (dyspnea on exertion)  R06.09    she saw cardiology, they had no concerns, she is getting second opinion, ECHO may be appropriate, overdue for lung nodule chest CT f/up, lungs CTA    6. Chest pain, unspecified type  R07.9    may be non-cardiac etiology, chest wall ttp, similar sx in the past, tx with PPI and possibly tx with short burst steroids Scheduled for a second opinion with Dr. Huey on Thursday. Important to discuss the possibility of an echocardiogram and stress test. - Discuss the possibility of an echocardiogram and stress test with Dr. Huey. - Provide written notes to discuss with Dr. Huey. (See below as well)    7. Lung nodules  R91.8 Ambulatory Referral Lung Cancer Screening [facility]   nodules in 2018 was doing annual CT monitoring but lost to f/up ~2021        Chest Pressure Reports of chest pressure and fatigue suggest potential cardiac issues. Previous pericarditis  in her 20's, but recent cardiology evaluation, including EKG, was normal. Differential diagnosis includes cardiac issues, costochondritis, or gastrointestinal, pulm? causes. A chest CT is recommended for other f/up and it would evaluate for pericardial effusion, or other abnormalities. An echocardiogram is suggested to assess cardiac function, especially given the fatigue with activity - defer to cardiology  - Order chest CT to evaluate for pericardial effusion, lung nodules, or other abnormalities. - Perform basic lab tests to rule out anemia, thyroid  dysfunction, or other metabolic issues. - Discuss the possibility of an echocardiogram with Dr. Huey to assess cardiac function. - Ensure pantoprazole  is taken twice daily to manage potential gastrointestinal causes of chest pressure.  Lung Nodules Lung nodules previously well-managed on imaging. Given current symptoms, further evaluation is warranted. A chest CT is recommended for detailed assessment. - Order chest CT to evaluate lung nodules and ensure stability.  General Health Maintenance  Weight and dietary intake are not concerning for fluid retention or heart failure. Weight is stable and not a concern at this time. - Reassure that her weight is stable and not a concern for CHF at this time  Short burst of prednisone  sent into pharmacy - she said in the past she got a steroid shot and everything got better immediately - unsure what injection she got in the past, we only have dexamethasone in office, oral prednisone  may be more effective - take in am for 3-5 d   Recording duration: 13 minutes       Michelene Cower, PA-C 08/21/24 3:48 PM  "

## 2024-08-22 ENCOUNTER — Ambulatory Visit: Payer: Self-pay | Admitting: Family Medicine

## 2024-08-22 ENCOUNTER — Encounter: Payer: Self-pay | Admitting: Family Medicine

## 2024-08-22 ENCOUNTER — Telehealth: Payer: Self-pay

## 2024-08-22 DIAGNOSIS — I517 Cardiomegaly: Secondary | ICD-10-CM

## 2024-08-22 DIAGNOSIS — R0609 Other forms of dyspnea: Secondary | ICD-10-CM

## 2024-08-22 DIAGNOSIS — I351 Nonrheumatic aortic (valve) insufficiency: Secondary | ICD-10-CM

## 2024-08-22 DIAGNOSIS — E66811 Obesity, class 1: Secondary | ICD-10-CM

## 2024-08-22 DIAGNOSIS — R079 Chest pain, unspecified: Secondary | ICD-10-CM

## 2024-08-22 DIAGNOSIS — I340 Nonrheumatic mitral (valve) insufficiency: Secondary | ICD-10-CM

## 2024-08-22 DIAGNOSIS — Z8679 Personal history of other diseases of the circulatory system: Secondary | ICD-10-CM

## 2024-08-22 DIAGNOSIS — R5383 Other fatigue: Secondary | ICD-10-CM

## 2024-08-22 LAB — CBC WITH DIFFERENTIAL/PLATELET
Absolute Lymphocytes: 2527 {cells}/uL (ref 850–3900)
Absolute Monocytes: 437 {cells}/uL (ref 200–950)
Basophils Absolute: 49 {cells}/uL (ref 0–200)
Basophils Relative: 0.6 %
Eosinophils Absolute: 203 {cells}/uL (ref 15–500)
Eosinophils Relative: 2.5 %
HCT: 35.1 % (ref 35.0–45.0)
Hemoglobin: 10.8 g/dL — ABNORMAL LOW (ref 11.7–15.5)
MCH: 23.2 pg — ABNORMAL LOW (ref 27.0–33.0)
MCHC: 30.8 g/dL — ABNORMAL LOW (ref 32.0–36.0)
MCV: 75.3 fL — ABNORMAL LOW (ref 80.0–100.0)
MPV: 11.5 fL (ref 7.5–12.5)
Monocytes Relative: 5.4 %
Neutro Abs: 4884 {cells}/uL (ref 1500–7800)
Neutrophils Relative %: 60.3 %
Platelets: 291 Thousand/uL (ref 140–400)
RBC: 4.66 Million/uL (ref 3.80–5.10)
RDW: 16.1 % — ABNORMAL HIGH (ref 11.0–15.0)
Total Lymphocyte: 31.2 %
WBC: 8.1 Thousand/uL (ref 3.8–10.8)

## 2024-08-22 LAB — COMPREHENSIVE METABOLIC PANEL WITH GFR
AG Ratio: 1.5 (calc) (ref 1.0–2.5)
ALT: 8 U/L (ref 6–29)
AST: 14 U/L (ref 10–35)
Albumin: 4.3 g/dL (ref 3.6–5.1)
Alkaline phosphatase (APISO): 68 U/L (ref 37–153)
BUN/Creatinine Ratio: 24 (calc) — ABNORMAL HIGH (ref 6–22)
BUN: 30 mg/dL — ABNORMAL HIGH (ref 7–25)
CO2: 29 mmol/L (ref 20–32)
Calcium: 9.7 mg/dL (ref 8.6–10.4)
Chloride: 103 mmol/L (ref 98–110)
Creat: 1.23 mg/dL — ABNORMAL HIGH (ref 0.60–1.00)
Globulin: 2.9 g/dL (ref 1.9–3.7)
Glucose, Bld: 102 mg/dL — ABNORMAL HIGH (ref 65–99)
Potassium: 4.7 mmol/L (ref 3.5–5.3)
Sodium: 139 mmol/L (ref 135–146)
Total Bilirubin: 0.3 mg/dL (ref 0.2–1.2)
Total Protein: 7.2 g/dL (ref 6.1–8.1)
eGFR: 46 mL/min/1.73m2 — ABNORMAL LOW (ref >=60)

## 2024-08-22 LAB — TSH: TSH: 0.99 m[IU]/L (ref 0.40–4.50)

## 2024-08-22 MED ORDER — PREDNISONE 20 MG PO TABS: ORAL_TABLET | ORAL | 0 refills | Status: AC

## 2024-08-22 NOTE — Telephone Encounter (Signed)
 Copied from CRM [id number]. Topic: Clinical - Request for Lab/Test Order >> Aug 21, 2024  5:09 PM Tobias CROME wrote: Reason for CRM: Patient had labwork done at today's visit, patient inquiring if her vitamin D levels could also be checked.   Patient forgot to task during visit.   Please assist patient further.

## 2024-08-23 ENCOUNTER — Ambulatory Visit: Admitting: Pulmonary Disease

## 2024-08-23 NOTE — Progress Notes (Signed)
 From LDCT team: Dionisio Aquas, RN  Leavy Mole, PA-C According to documentation in 2021 with oncology, this patient has no smoking history and cannot be followed by LDCT.  She could have CT chest wo or other scan ordered but not under our program.  The patient saw Dr. Tamea for pulmonary in 2019 but no recent OV.  She could be referred to pulm provider, if you recommend.  Sorry we cannot include her in LDCT program.   I will order the f/up CT chest w/o for f/up on the nodules that got lost to f/up in 2021  CT chest w/o ordered today Mole Leavy, PA-C 08/23/24 11:08 AM

## 2024-08-23 NOTE — Telephone Encounter (Signed)
 Patient came by office to pick up stool card- reviewed everything in person. Patient verbalized understanding.

## 2024-08-23 NOTE — Addendum Note (Signed)
 Addended by: Shawnette Augello on: 08/23/2024 11:08 AM   Modules accepted: Orders

## 2024-08-24 ENCOUNTER — Other Ambulatory Visit: Payer: Self-pay | Admitting: Internal Medicine

## 2024-08-24 DIAGNOSIS — R0602 Shortness of breath: Secondary | ICD-10-CM

## 2024-08-28 NOTE — Progress Notes (Signed)
 Pharmacy Quality Measure Review  This patient is appearing on a report for being at risk of failing the adherence measure for hypertension (ACEi/ARB) medications this calendar year.   Medication: valsartan /hydrochlorothiazide   Last fill date: 08/10/24 for 90 day supply  Insurance report was not up to date. No action needed at this time.   Leonardo Plaia E. Marsh, PharmD Clinical Pharmacist [facility] [phone]

## 2024-08-30 ENCOUNTER — Ambulatory Visit
Admission: RE | Admit: 2024-08-30 | Discharge: 2024-08-30 | Disposition: A | Discharge status: Home / Self Care | Source: Ambulatory Visit | Attending: Internal Medicine | Admitting: Internal Medicine

## 2024-08-30 DIAGNOSIS — R0602 Shortness of breath: Secondary | ICD-10-CM | POA: Insufficient documentation

## 2024-08-30 DIAGNOSIS — I700 Atherosclerosis of aorta: Secondary | ICD-10-CM | POA: Diagnosis not present

## 2024-08-30 DIAGNOSIS — R918 Other nonspecific abnormal finding of lung field: Secondary | ICD-10-CM | POA: Insufficient documentation

## 2024-09-01 ENCOUNTER — Other Ambulatory Visit: Payer: Self-pay | Admitting: Family Medicine

## 2024-09-01 DIAGNOSIS — Z1231 Encounter for screening mammogram for malignant neoplasm of breast: Secondary | ICD-10-CM

## 2024-09-04 ENCOUNTER — Ambulatory Visit (INDEPENDENT_AMBULATORY_CARE_PROVIDER_SITE_OTHER)

## 2024-09-04 DIAGNOSIS — D649 Anemia, unspecified: Secondary | ICD-10-CM

## 2024-09-04 LAB — POC HEMOCCULT BLD/STL (HOME/3-CARD/SCREEN)
Card #2 Fecal Occult Blod, POC: NEGATIVE
Card #3 Fecal Occult Blood, POC: NEGATIVE
Fecal Occult Blood, POC: NEGATIVE

## 2024-09-23 ENCOUNTER — Other Ambulatory Visit: Payer: Self-pay | Admitting: Cardiovascular Disease

## 2024-10-09 ENCOUNTER — Ambulatory Visit

## 2024-10-11 ENCOUNTER — Other Ambulatory Visit: Payer: Self-pay | Admitting: Medical Genetics

## 2024-10-13 ENCOUNTER — Ambulatory Visit
Admission: RE | Admit: 2024-10-13 | Discharge: 2024-10-13 | Disposition: A | Discharge status: Home / Self Care | Source: Ambulatory Visit | Attending: Family Medicine | Admitting: Family Medicine

## 2024-10-13 DIAGNOSIS — Z1231 Encounter for screening mammogram for malignant neoplasm of breast: Secondary | ICD-10-CM | POA: Diagnosis present

## 2024-10-17 ENCOUNTER — Ambulatory Visit: Payer: Self-pay | Admitting: Internal Medicine

## 2024-11-17 ENCOUNTER — Telehealth: Payer: Self-pay

## 2024-11-17 NOTE — Progress Notes (Signed)
 Pharmacy Quality Measure Review  This patient is appearing on a report for being at risk of failing the adherence measure for hypertension (ACEi/ARB) medications this calendar year.   Medication: valsartan /hydrochlorothiazide    Last fill date: 11/01/24 for 100 day supply  Insurance report was not up to date. No action needed at this time.   Terel Bann E. Marsh, PharmD, CPP Clinical Pharmacist [facility] [phone]

## 2024-11-24 ENCOUNTER — Encounter: Admitting: Family Medicine

## 2025-01-12 ENCOUNTER — Encounter: Payer: Self-pay | Admitting: Gastroenterology

## 2025-01-12 ENCOUNTER — Encounter
Admission: RE | Disposition: A | Discharge status: Home / Self Care | Payer: Self-pay | Source: Home / Self Care | Attending: Gastroenterology

## 2025-01-12 ENCOUNTER — Ambulatory Visit: Payer: Medicare (Managed Care)

## 2025-01-12 ENCOUNTER — Ambulatory Visit
Admission: RE | Admit: 2025-01-12 | Discharge: 2025-01-12 | Disposition: A | Discharge status: Home / Self Care | Payer: Medicare (Managed Care) | Source: Home / Self Care | Attending: Gastroenterology | Admitting: Gastroenterology

## 2025-01-12 MED ORDER — GLYCOPYRROLATE 0.2 MG/ML IJ SOLN
INTRAMUSCULAR | Status: AC
  Filled 2025-01-12: qty 1

## 2025-01-12 MED ORDER — LIDOCAINE HCL (CARDIAC) PF 100 MG/5ML IV SOSY
PREFILLED_SYRINGE | INTRAVENOUS | Status: DC | PRN
  Administered 2025-01-12: 100 mg via INTRAVENOUS

## 2025-01-12 MED ORDER — PROPOFOL 10 MG/ML IV BOLUS
INTRAVENOUS | Status: DC | PRN
  Administered 2025-01-12 (×2): 25 mg via INTRAVENOUS
  Administered 2025-01-12: 100 mg via INTRAVENOUS
  Administered 2025-01-12 (×3): 25 mg via INTRAVENOUS

## 2025-01-12 MED ORDER — SODIUM CHLORIDE 0.9 % IV SOLN
INTRAVENOUS | Status: DC
  Administered 2025-01-12: 500 mL via INTRAVENOUS

## 2025-01-12 MED ORDER — GLYCOPYRROLATE 0.2 MG/ML IJ SOLN
INTRAMUSCULAR | Status: DC | PRN
  Administered 2025-01-12: .2 mg via INTRAVENOUS

## 2025-01-12 MED ORDER — LIDOCAINE HCL (PF) 2 % IJ SOLN
INTRAMUSCULAR | Status: AC
  Filled 2025-01-12: qty 5

## 2025-01-12 NOTE — Op Note (Signed)
 [facility] Patient Name: Ruth Bishop Procedure Date: 01/12/2025 10:59 AM MRN: [id number] Account #: [id number] Date of Birth: [birth year] Admit Type: Outpatient Age: 79 Room: [facility] ENDO ROOM 2 Gender: Female Note Status: Finalized Instrument Name: Colon Scope [id number] Procedure:             Colonoscopy Indications:           Last colonoscopy: October 2016, Unexplained iron                         deficiency anemia Providers:             Corinn Jess Brooklyn MD, MD Referring MD:          Corinn Jess Brooklyn MD, MD (Referring MD), No Local Md,                         MD (Referring MD) Medicines:             General Anesthesia Complications:         No immediate complications. Estimated blood loss: None. Procedure:             Pre-Anesthesia Assessment:                        - Prior to the procedure, a History and Physical was                         performed, and patient medications and allergies were                         reviewed. The patient is competent. The risks and                         benefits of the procedure and the sedation options and                         risks were discussed with the patient. All questions                         were answered and informed consent was obtained.                         Patient identification and proposed procedure were                         verified by the physician, the nurse, the                         anesthesiologist, the anesthetist and the technician                         in the pre-procedure area in the procedure room in the                         endoscopy suite. Mental Status Examination: alert and                         oriented. Airway Examination: normal oropharyngeal  airway and neck mobility. Respiratory Examination:                         clear to auscultation. CV Examination: normal.                         Prophylactic Antibiotics: The patient does  not require                         prophylactic antibiotics. Prior Anticoagulants: The                         patient has taken no anticoagulant or antiplatelet                         agents. ASA Grade Assessment: III - A patient with                         severe systemic disease. After reviewing the risks and                         benefits, the patient was deemed in satisfactory                         condition to undergo the procedure. The anesthesia                         plan was to use general anesthesia. Immediately prior                         to administration of medications, the patient was                         re-assessed for adequacy to receive sedatives. The                         heart rate, respiratory rate, oxygen saturations,                         blood pressure, adequacy of pulmonary ventilation, and                         response to care were monitored throughout the                         procedure. The physical status of the patient was                         re-assessed after the procedure.                        After obtaining informed consent, the colonoscope was                         passed under direct vision. Throughout the procedure,                         the patient's blood pressure, pulse, and oxygen  saturations were monitored continuously. The                         Colonoscope was introduced through the anus and                         advanced to the the terminal ileum, with                         identification of the appendiceal orifice and IC                         valve. The colonoscopy was performed without                         difficulty. The patient tolerated the procedure well.                         The quality of the bowel preparation was evaluated                         using the BBPS [place] Bowel Preparation Scale) with                         scores of: Right Colon = 3, Transverse Colon =  3 and                         Left Colon = 3 (entire mucosa seen well with no                         residual staining, small fragments of stool or opaque                         liquid). The total BBPS score equals 9. The terminal                         ileum, ileocecal valve, appendiceal orifice, and                         rectum were photographed. Findings:      The perianal and digital rectal examinations were normal. Pertinent       negatives include normal sphincter tone and no palpable rectal lesions.      A 5 mm polyp was found in the transverse colon. The polyp was sessile.       The polyp was removed with a cold snare. Resection and retrieval were       complete. Estimated blood loss was minimal.      The retroflexed view of the distal rectum and anal verge was normal and       showed no anal or rectal abnormalities.      The terminal ileum appeared normal.      The exam was otherwise without abnormality. Impression:            - One 5 mm polyp in the transverse colon, removed with                         a cold snare. Resected and retrieved.                        -  The distal rectum and anal verge are normal on                         retroflexion view.                        - The examined portion of the ileum was normal.                        - The examination was otherwise normal. Recommendation:        - Discharge patient to home (with escort).                        - Resume previous diet today.                        - Continue present medications.                        - Await pathology results.                        - To visualize the small bowel, perform video capsule                         endoscopy at appointment to be scheduled. Procedure Code(s):     --- Professional ---                        [phone], Colonoscopy, flexible; with removal of                         tumor(s), polyp(s), or other lesion(s) by snare                         technique Diagnosis  Code(s):     --- Professional ---                        D12.3, Benign neoplasm of transverse colon (hepatic                         flexure or splenic flexure)                        D50.9, Iron deficiency anemia, unspecified CPT copyright 2022 American Medical Association. All rights reserved. The codes documented in this report are preliminary and upon coder review may  be revised to meet current compliance requirements. Dr. Corinn Brooklyn Corinn Jess Brooklyn MD, MD 01/12/2025 11:25:12 AM This report has been signed electronically. Number of Addenda: 0 Note Initiated On: 01/12/2025 10:59 AM Scope Withdrawal Time: 0 hours 5 minutes 2 seconds  Total Procedure Duration: 0 hours 8 minutes 42 seconds  Estimated Blood Loss:  Estimated blood loss was minimal.      [facility]

## 2025-01-12 NOTE — Anesthesia Postprocedure Evaluation (Signed)
"   Anesthesia Post Note  Patient: Tambra Muller  Procedure(s) Performed: COLONOSCOPY EGD (ESOPHAGOGASTRODUODENOSCOPY) BIOPSY, GI TRACT COLONOSCOPY, WITH POLYPECTOMY  Patient location during evaluation: PACU Anesthesia Type: General Level of consciousness: awake Pain management: pain level controlled Vital Signs Assessment: vitals unstable Respiratory status: nonlabored ventilation Cardiovascular status: blood pressure returned to baseline Anesthetic complications: no   No notable events documented.   Last Vitals:  Vitals:   01/12/25 1136 01/12/25 1143  BP: 123/73 129/79  Pulse: 72 73  Resp: 12 12  Temp:    SpO2: 100% 100%    Last Pain:  Vitals:   01/12/25 1143  TempSrc:   PainSc: 0-No pain                 VAN STAVEREN,Dinisha Cai      "

## 2025-01-12 NOTE — Anesthesia Preprocedure Evaluation (Addendum)
 "                                  Anesthesia Evaluation  Patient identified by MRN, date of birth, ID band Patient awake    Reviewed: Allergy & Precautions, NPO status , Patient's Chart, lab work & pertinent test results  Airway Mallampati: II  TM Distance: >3 FB Neck ROM: Full    Dental  (+) Teeth Intact   Pulmonary neg pulmonary ROS   Pulmonary exam normal        Cardiovascular Exercise Tolerance: Good hypertension, Pt. on medications Normal cardiovascular exam+ dysrhythmias  Rhythm:Regular Rate:Normal     Neuro/Psych negative neurological ROS  negative psych ROS   GI/Hepatic negative GI ROS, Neg liver ROS,GERD  ,,  Endo/Other  negative endocrine ROS    Renal/GU negative Renal ROS  negative genitourinary   Musculoskeletal   Abdominal Normal abdominal exam  (+)   Peds negative pediatric ROS (+)  Hematology negative hematology ROS (+)   Anesthesia Other Findings Past Medical History: 10/18/2018: Abnormal chest x-ray 11/23/2023: Advanced care planning/counseling discussion No date: Arthritis 05/24/2015: Chronic pain 03/20/2022: Class 1 obesity with body mass index (BMI) of 32.0 to 32. 9 in adult 06/17/2016: Diastolic dysfunction No date: GERD (gastroesophageal reflux disease) No date: Heart murmur 05/26/2016: Hormone replacement therapy No date: Hypertension 03/20/2022: Hypertriglyceridemia 08/04/2017: Incidental lung nodule, > 3mm and < 8mm     Comment:  4 mm ground glass nodule LLL incidentally noted on CT               scan Nov 2016 06/13/2015: Osteoarthritis of both knees     Comment:  [facility]  05/24/2015: Post menopausal syndrome 05/24/2015: Skin-picking disorder     Comment:  when nervous/stressed chews on fingers and peels down               skin, started with cuticles but now hypopigmentation of               fingers down to palm in some areas with lifetime of skin               picking.   Past Surgical History: No  date: ABDOMINAL HYSTERECTOMY 10/01/2015: COLONOSCOPY WITH PROPOFOL ; N/A     Comment:  Procedure: COLONOSCOPY WITH PROPOFOL ;  Surgeon: Gladis RAYMOND Mariner, MD;  Location: [facility] ENDOSCOPY;  Service:               Endoscopy;  Laterality: N/A; 10/01/2015: ESOPHAGOGASTRODUODENOSCOPY (EGD) WITH PROPOFOL ; N/A     Comment:  Procedure: ESOPHAGOGASTRODUODENOSCOPY (EGD) WITH               PROPOFOL ;  Surgeon: Gladis RAYMOND Mariner, MD;  Location:               [facility] ENDOSCOPY;  Service: Endoscopy;  Laterality: N/A; No date: TONSILLECTOMY  BMI    Body Mass Index: 26.67 kg/m      Reproductive/Obstetrics negative OB ROS                              Anesthesia Physical Anesthesia Plan  ASA: 3  Anesthesia Plan: General   Post-op Pain Management:    Induction: Intravenous  PONV Risk Score and Plan: Propofol  infusion and TIVA  Airway Management Planned: Natural Airway and  Nasal Cannula  Additional Equipment:   Intra-op Plan:   Post-operative Plan:   Informed Consent: I have reviewed the patients History and Physical, chart, labs and discussed the procedure including the risks, benefits and alternatives for the proposed anesthesia with the patient or authorized representative who has indicated his/her understanding and acceptance.     Dental Advisory Given  Plan Discussed with: CRNA  Anesthesia Plan Comments:          Anesthesia Quick Evaluation  "

## 2025-01-12 NOTE — H&P (Signed)
 "   Corinn JONELLE Brooklyn, MD [facility], DHIP [address]  [place], [place] [address]  Main: [phone] Fax:  [phone] Pager: [phone]   Primary Care Physician:  Salli Amato, MD Primary Gastroenterologist:  Dr. Corinn JONELLE Brooklyn  Pre-Procedure History & Physical: HPI:  Ruth Bishop is a 79 y.o. female is here for an endoscopy and colonoscopy.   Past Medical History:  Diagnosis Date   Abnormal chest x-ray 10/18/2018   Advanced care planning/counseling discussion 11/23/2023   Arthritis    Chronic pain 05/24/2015   Class 1 obesity with body mass index (BMI) of 32.0 to 32.9 in adult 03/20/2022   Diastolic dysfunction 06/17/2016   GERD (gastroesophageal reflux disease)    Heart murmur    Hormone replacement therapy 05/26/2016   Hypertension    Hypertriglyceridemia 03/20/2022   Incidental lung nodule, > 3mm and < 8mm 08/04/2017   4 mm ground glass nodule LLL incidentally noted on CT scan Nov 2016   Osteoarthritis of both knees 06/13/2015   [facility]    Post menopausal syndrome 05/24/2015   Skin-picking disorder 05/24/2015   when nervous/stressed chews on fingers and peels down skin, started with cuticles but now hypopigmentation of fingers down to palm in some areas with lifetime of skin picking.     Past Surgical History:  Procedure Laterality Date   ABDOMINAL HYSTERECTOMY     COLONOSCOPY WITH PROPOFOL  N/A 10/01/2015   Procedure: COLONOSCOPY WITH PROPOFOL ;  Surgeon: Gladis RAYMOND Mariner, MD;  Location: [facility] ENDOSCOPY;  Service: Endoscopy;  Laterality: N/A;   ESOPHAGOGASTRODUODENOSCOPY (EGD) WITH PROPOFOL  N/A 10/01/2015   Procedure: ESOPHAGOGASTRODUODENOSCOPY (EGD) WITH PROPOFOL ;  Surgeon: Gladis RAYMOND Mariner, MD;  Location: [facility] ENDOSCOPY;  Service: Endoscopy;  Laterality: N/A;   TONSILLECTOMY      Prior to Admission medications  Medication Sig Start Date End Date Taking? Authorizing Provider  aspirin EC 81 MG tablet Take 81 mg by  mouth daily.   Yes [provider]  Baclofen  5 MG TABS Take 1 tablet (5 mg total) by mouth 3 (three) times daily as needed (msk pain or spasms). 05/23/24  Yes Tapia, Leisa, PA-C  Calcium Carbonate-Vitamin D 600-400 MG-UNIT tablet Take 1 tablet by mouth daily.    Yes [provider]  fluticasone  (FLONASE ) 50 MCG/ACT nasal spray Place 2 sprays into both nostrils daily. 12/20/23  Yes Tapia, Leisa, PA-C  ketoconazole  (NIZORAL ) 2 % shampoo Apply 1 Application topically 2 (two) times a week. 02/03/24  Yes Tapia, Leisa, PA-C  levocetirizine (XYZAL ) 5 MG tablet Take 1 tablet (5 mg total) by mouth every evening. 12/20/23  Yes Tapia, Leisa, PA-C  MULTIPLE VITAMIN PO Take 1 tablet by mouth daily.    Yes [provider]  psyllium (METAMUCIL) 0.52 g capsule Take 0.52 g by mouth daily.   Yes [provider]  TURMERIC PO Take 1 tablet by mouth daily.    Yes [provider]  valsartan -hydrochlorothiazide  (DIOVAN -HCT) 320-12.5 MG tablet TAKE 1 TABLET BY MOUTH DAILY 09/26/24  Yes Gollan, Timothy J, MD  albuterol  (VENTOLIN  HFA) 108 (90 Base) MCG/ACT inhaler Inhale 2 puffs into the lungs every 4 (four) hours as needed for wheezing or shortness of breath. 06/24/22   Bernardo Fend, DO  pantoprazole  (PROTONIX ) 40 MG tablet Take 1 tablet (40 mg total) by mouth 2 (two) times daily. 08/21/24   Tapia, Leisa, PA-C  predniSONE  (DELTASONE ) 20 MG tablet 2 tabs poqday 1-3, 1 tabs poqday 4-6 Patient not taking: Reported on 01/12/2025 08/22/24  Tapia, Leisa, PA-C    Allergies as of 12/25/2024 - Review Complete 08/22/2024  Allergen Reaction Noted   Nitrofurantoin Nausea Only 08/27/2015   Nitrofurantoin monohyd macro  05/24/2015    Family History  Problem Relation Age of Onset   Heart disease Mother    Diabetes Mother    Aneurysm Mother    Heart disease Father    Heart attack Father    Heart disease Sister    Diabetes Sister    Pneumonia Sister    Heart disease Brother     Diabetes Brother    Diabetes Sister    Heart disease Sister    Congestive Heart Failure Sister    Breast cancer Neg Hx     Social History   Socioeconomic History   Marital status: Widowed    Spouse name: Not on file   Number of children: 1   Years of education: Not on file   Highest education level: Master's degree (e.g., MA, MS, MEng, MEd, MSW, MBA)  Occupational History   Occupation: Retired  Tobacco Use   Smoking status: Never   Smokeless tobacco: Never   Tobacco comments:    smoking cessation materials not required  Vaping Use   Vaping status: Never Used  Substance and Sexual Activity   Alcohol use: Not Currently    Alcohol/week: 0.0 standard drinks of alcohol   Drug use: No   Sexual activity: Not Currently  Other Topics Concern   Not on file  Social History Narrative   Pt lives alone   Social Drivers of Health   Tobacco Use: Low Risk (01/12/2025)   Patient History    Smoking Tobacco Use: Never    Smokeless Tobacco Use: Never    Passive Exposure: Not on file  Financial Resource Strain: Low Risk  (12/25/2024)   Received from [facility]   Overall Financial Resource Strain (CARDIA)    Difficulty of Paying Living Expenses: Not hard at all  Food Insecurity: No Food Insecurity (12/25/2024)   Received from [facility]   Epic    Within the past 12 months, you worried that your food would run out before you got the money to buy more.: Never true    Within the past 12 months, the food you bought just didn't last and you didn't have money to get more.: Never true  Transportation Needs: No Transportation Needs (12/25/2024)   Received from [facility] - Transportation    In the past 12 months, has lack of transportation kept you from medical appointments or from getting medications?: No    Lack of Transportation (Non-Medical): No  Physical Activity: Insufficiently Active (12/16/2023)   Exercise Vital Sign     Days of Exercise per Week: 3 days    Minutes of Exercise per Session: 40 min  Stress: No Stress Concern Present (12/16/2023)   [employer] of [employer]ational [employer] - Occupational Stress Questionnaire    Feeling of Stress : Not at all  Social Connections: Moderately Integrated (12/16/2023)   Social Connection and Isolation Panel    Frequency of Communication with Friends and Family: More than three times a week    Frequency of Social Gatherings with Friends and Family: Twice a week    Attends Religious Services: More than 4 times per year    Active Member of [employer] or Organizations: Yes    Attends Banker Meetings: More than 4 times per year    Marital Status:  Widowed  Intimate Partner Violence: Not At Risk (12/16/2023)   Humiliation, Afraid, Rape, and Kick questionnaire    Fear of Current or Ex-Partner: No    Emotionally Abused: No    Physically Abused: No    Sexually Abused: No  Depression (PHQ2-9): Low Risk (12/16/2023)   Depression (PHQ2-9)    PHQ-2 Score: 0  Alcohol Screen: Low Risk (12/16/2023)   Alcohol Screen    Last Alcohol Screening Score (AUDIT): 1  Housing: Low Risk  (12/25/2024)   Received from [facility]   Epic    In the last 12 months, was there a time when you were not able to pay the mortgage or rent on time?: No    In the past 12 months, how many times have you moved where you were living?: 0    At any time in the past 12 months, were you homeless or living in a shelter (including now)?: No  Utilities: Not At Risk (12/25/2024)   Received from [facility]   Epic    In the past 12 months has the electric, gas, oil, or water company threatened to shut off services in your home?: No  Health Literacy: Adequate Health Literacy (12/16/2023)   B1300 Health Literacy    Frequency of need for help with medical instructions: Never    Review of Systems: See HPI, otherwise negative ROS  Physical Exam: BP (!) 151/63   Pulse (!) 58    Temp (!) 96.6 F (35.9 C) (Temporal)   Resp 18   Ht 5' 9 (1.753 m)   Wt 81.9 kg   SpO2 100%   BMI 26.67 kg/m  General:   Alert,  pleasant and cooperative in NAD Head:  Normocephalic and atraumatic. Neck:  Supple; no masses or thyromegaly. Lungs:  Clear throughout to auscultation.    Heart:  Regular rate and rhythm. Abdomen:  Soft, nontender and nondistended. Normal bowel sounds, without guarding, and without rebound.   Neurologic:  Alert and  oriented x4;  grossly normal neurologically.  Impression/Plan: Ruth Bishop is here for an endoscopy and colonoscopy to be performed for Iron deficiency anemia   Risks, benefits, limitations, and alternatives regarding  endoscopy and colonoscopy have been reviewed with the patient.  Questions have been answered.  All parties agreeable.   Corinn Brooklyn, MD  01/12/2025, 10:46 AM "

## 2025-01-12 NOTE — Op Note (Signed)
 [facility] Patient Name: Ruth Bishop Procedure Date: 01/12/2025 11:00 AM MRN: [id number] Account #: [id number] Date of Birth: Jun 17, 1946 Admit Type: Outpatient Age: 79 Room: [facility] ENDO ROOM 2 Gender: Female Note Status: Finalized Instrument Name: Upper GI Scope [id number] Procedure:             Upper GI endoscopy Indications:           Unexplained iron deficiency anemia Providers:             Corinn Jess Brooklyn MD, MD Referring MD:          Corinn Jess Brooklyn MD, MD (Referring MD), No Local Md,                         MD (Referring MD) Medicines:             General Anesthesia Complications:         No immediate complications. Estimated blood loss: None. Procedure:             Pre-Anesthesia Assessment:                        - Prior to the procedure, a History and Physical was                         performed, and patient medications and allergies were                         reviewed. The patient is competent. The risks and                         benefits of the procedure and the sedation options and                         risks were discussed with the patient. All questions                         were answered and informed consent was obtained.                         Patient identification and proposed procedure were                         verified by the physician, the nurse, the                         anesthesiologist, the anesthetist and the technician                         in the pre-procedure area in the procedure room in the                         endoscopy suite. Mental Status Examination: alert and                         oriented. Airway Examination: normal oropharyngeal                         airway and neck mobility. Respiratory Examination:  clear to auscultation. CV Examination: normal.                         Prophylactic Antibiotics: The patient does not require                          prophylactic antibiotics. Prior Anticoagulants: The                         patient has taken no anticoagulant or antiplatelet                         agents. ASA Grade Assessment: III - A patient with                         severe systemic disease. After reviewing the risks and                         benefits, the patient was deemed in satisfactory                         condition to undergo the procedure. The anesthesia                         plan was to use general anesthesia. Immediately prior                         to administration of medications, the patient was                         re-assessed for adequacy to receive sedatives. The                         heart rate, respiratory rate, oxygen saturations,                         blood pressure, adequacy of pulmonary ventilation, and                         response to care were monitored throughout the                         procedure. The physical status of the patient was                         re-assessed after the procedure.                        After obtaining informed consent, the endoscope was                         passed under direct vision. Throughout the procedure,                         the patient's blood pressure, pulse, and oxygen                         saturations were monitored continuously. The Endoscope  was introduced through the mouth, and advanced to the                         second part of duodenum. The upper GI endoscopy was                         accomplished without difficulty. The patient tolerated                         the procedure well. Findings:      The duodenal bulb and second portion of the duodenum were normal.      The entire examined stomach was normal. Biopsies were taken with a cold       forceps for histology.      The cardia and gastric fundus were normal on retroflexion.      The gastroesophageal junction and examined esophagus were normal.      A  small hiatal hernia was present. Impression:            - Normal duodenal bulb and second portion of the                         duodenum.                        - Normal stomach. Biopsied.                        - Normal gastroesophageal junction and esophagus.                        - Small hiatal hernia. Recommendation:        - Await pathology results.                        - Proceed with colonoscopy as scheduled                        See colonoscopy report Procedure Code(s):     --- Professional ---                        [phone], Esophagogastroduodenoscopy, flexible,                         transoral; with biopsy, single or multiple Diagnosis Code(s):     --- Professional ---                        D50.9, Iron deficiency anemia, unspecified CPT copyright 2022 American Medical Association. All rights reserved. The codes documented in this report are preliminary and upon coder review may  be revised to meet current compliance requirements. Dr. Corinn Brooklyn Corinn Jess Brooklyn MD, MD 01/12/2025 11:11:49 AM This report has been signed electronically. Number of Addenda: 0 Note Initiated On: 01/12/2025 11:00 AM Estimated Blood Loss:  Estimated blood loss: none.      [facility]

## 2025-01-12 NOTE — Transfer of Care (Addendum)
 Immediate Anesthesia Transfer of Care Note  Patient: Ruth Bishop  Procedure(s) Performed: COLONOSCOPY EGD (ESOPHAGOGASTRODUODENOSCOPY) BIOPSY, GI TRACT COLONOSCOPY, WITH POLYPECTOMY  Patient Location: PACU and Endoscopy Unit  Anesthesia Type:General  Level of Consciousness: awake, alert , and oriented  Airway & Oxygen Therapy: Patient Spontanous Breathing  Post-op Assessment: Report given to RN and Post -op Vital signs reviewed and stable  Post vital signs: Reviewed and stable  Last Vitals:  Vitals Value Taken Time  BP 134/70 01/12/25 11:26  Temp 36.1 C 01/12/25 11:26  Pulse 80 01/12/25 11:26  Resp 19 01/12/25 11:27  SpO2 100 % 01/12/25 11:26  Vitals shown include unfiled device data.  Last Pain:  Vitals:   01/12/25 1126  TempSrc: Temporal  PainSc:          Complications: No notable events documented.
